# Patient Record
Sex: Female | Born: 1945 | Race: White | Hispanic: No | Marital: Married | Smoking: Never smoker
Health system: Southern US, Community
[De-identification: ages and names within clinical notes are randomized; demographics above are authoritative.]

## PROBLEM LIST (undated history)

## (undated) DIAGNOSIS — I10 Essential (primary) hypertension: Secondary | ICD-10-CM

## (undated) DIAGNOSIS — K5909 Other constipation: Secondary | ICD-10-CM

## (undated) DIAGNOSIS — Z8659 Personal history of other mental and behavioral disorders: Secondary | ICD-10-CM

## (undated) DIAGNOSIS — D649 Anemia, unspecified: Secondary | ICD-10-CM

## (undated) DIAGNOSIS — J189 Pneumonia, unspecified organism: Secondary | ICD-10-CM

## (undated) DIAGNOSIS — K219 Gastro-esophageal reflux disease without esophagitis: Secondary | ICD-10-CM

## (undated) DIAGNOSIS — Z9889 Other specified postprocedural states: Secondary | ICD-10-CM

## (undated) DIAGNOSIS — D126 Benign neoplasm of colon, unspecified: Secondary | ICD-10-CM

## (undated) DIAGNOSIS — M8580 Other specified disorders of bone density and structure, unspecified site: Secondary | ICD-10-CM

## (undated) DIAGNOSIS — E785 Hyperlipidemia, unspecified: Secondary | ICD-10-CM

## (undated) DIAGNOSIS — J45909 Unspecified asthma, uncomplicated: Secondary | ICD-10-CM

## (undated) DIAGNOSIS — M810 Age-related osteoporosis without current pathological fracture: Secondary | ICD-10-CM

## (undated) DIAGNOSIS — R112 Nausea with vomiting, unspecified: Secondary | ICD-10-CM

## (undated) DIAGNOSIS — K5730 Diverticulosis of large intestine without perforation or abscess without bleeding: Secondary | ICD-10-CM

## (undated) DIAGNOSIS — M1990 Unspecified osteoarthritis, unspecified site: Secondary | ICD-10-CM

## (undated) DIAGNOSIS — K209 Esophagitis, unspecified without bleeding: Secondary | ICD-10-CM

## (undated) DIAGNOSIS — T8859XA Other complications of anesthesia, initial encounter: Secondary | ICD-10-CM

## (undated) DIAGNOSIS — K5792 Diverticulitis of intestine, part unspecified, without perforation or abscess without bleeding: Secondary | ICD-10-CM

## (undated) DIAGNOSIS — N809 Endometriosis, unspecified: Secondary | ICD-10-CM

## (undated) DIAGNOSIS — M549 Dorsalgia, unspecified: Secondary | ICD-10-CM

## (undated) DIAGNOSIS — R51 Headache: Secondary | ICD-10-CM

## (undated) DIAGNOSIS — G4730 Sleep apnea, unspecified: Secondary | ICD-10-CM

## (undated) DIAGNOSIS — R002 Palpitations: Secondary | ICD-10-CM

## (undated) DIAGNOSIS — T4145XA Adverse effect of unspecified anesthetic, initial encounter: Secondary | ICD-10-CM

## (undated) DIAGNOSIS — I2699 Other pulmonary embolism without acute cor pulmonale: Secondary | ICD-10-CM

## (undated) DIAGNOSIS — F32A Depression, unspecified: Secondary | ICD-10-CM

## (undated) DIAGNOSIS — I739 Peripheral vascular disease, unspecified: Secondary | ICD-10-CM

## (undated) HISTORY — DX: Other pulmonary embolism without acute cor pulmonale: I26.99

## (undated) HISTORY — DX: Other specified disorders of bone density and structure, unspecified site: M85.80

## (undated) HISTORY — PX: CARDIAC CATHETERIZATION: SHX172

## (undated) HISTORY — PX: PARTIAL HYSTERECTOMY: SHX80

## (undated) HISTORY — PX: ABDOMINOPLASTY: SUR9

## (undated) HISTORY — DX: Diverticulitis of intestine, part unspecified, without perforation or abscess without bleeding: K57.92

## (undated) HISTORY — PX: APPENDECTOMY: SHX54

## (undated) HISTORY — DX: Unspecified asthma, uncomplicated: J45.909

## (undated) HISTORY — PX: ABDOMINAL HYSTERECTOMY: SHX81

## (undated) HISTORY — DX: Gastro-esophageal reflux disease without esophagitis: K21.9

## (undated) HISTORY — DX: Diverticulosis of large intestine without perforation or abscess without bleeding: K57.30

## (undated) HISTORY — DX: Personal history of other mental and behavioral disorders: Z86.59

## (undated) HISTORY — DX: Other constipation: K59.09

## (undated) HISTORY — DX: Esophagitis, unspecified without bleeding: K20.90

## (undated) HISTORY — PX: COLONOSCOPY: SHX174

## (undated) HISTORY — PX: LUMBAR LAMINECTOMY: SHX95

## (undated) HISTORY — DX: Endometriosis, unspecified: N80.9

## (undated) HISTORY — DX: Esophagitis, unspecified: K20.9

## (undated) HISTORY — PX: LIPOSUCTION: SHX10

## (undated) HISTORY — PX: TUBAL LIGATION: SHX77

## (undated) HISTORY — DX: Age-related osteoporosis without current pathological fracture: M81.0

## (undated) HISTORY — DX: Hyperlipidemia, unspecified: E78.5

## (undated) HISTORY — PX: JOINT REPLACEMENT: SHX530

## (undated) HISTORY — DX: Benign neoplasm of colon, unspecified: D12.6

---

## 1998-12-10 ENCOUNTER — Ambulatory Visit (HOSPITAL_COMMUNITY): Admission: RE | Admit: 1998-12-10 | Discharge: 1998-12-10 | Payer: Self-pay | Admitting: Neurosurgery

## 1998-12-10 ENCOUNTER — Encounter: Payer: Self-pay | Admitting: Neurosurgery

## 1999-09-05 ENCOUNTER — Other Ambulatory Visit: Admission: RE | Admit: 1999-09-05 | Discharge: 1999-09-05 | Payer: Self-pay | Admitting: Obstetrics and Gynecology

## 2000-02-12 ENCOUNTER — Encounter: Payer: Self-pay | Admitting: *Deleted

## 2000-02-12 ENCOUNTER — Ambulatory Visit (HOSPITAL_COMMUNITY): Admission: RE | Admit: 2000-02-12 | Discharge: 2000-02-12 | Payer: Self-pay | Admitting: *Deleted

## 2000-04-17 ENCOUNTER — Ambulatory Visit (HOSPITAL_BASED_OUTPATIENT_CLINIC_OR_DEPARTMENT_OTHER): Admission: RE | Admit: 2000-04-17 | Discharge: 2000-04-18 | Payer: Self-pay | Admitting: Orthopedic Surgery

## 2000-08-06 ENCOUNTER — Ambulatory Visit (HOSPITAL_BASED_OUTPATIENT_CLINIC_OR_DEPARTMENT_OTHER): Admission: RE | Admit: 2000-08-06 | Discharge: 2000-08-07 | Payer: Self-pay | Admitting: Orthopedic Surgery

## 2001-04-16 ENCOUNTER — Other Ambulatory Visit: Admission: RE | Admit: 2001-04-16 | Discharge: 2001-04-16 | Payer: Self-pay | Admitting: Obstetrics and Gynecology

## 2002-03-03 ENCOUNTER — Encounter: Payer: Self-pay | Admitting: Internal Medicine

## 2004-09-27 ENCOUNTER — Ambulatory Visit: Payer: Self-pay | Admitting: Internal Medicine

## 2004-10-05 ENCOUNTER — Ambulatory Visit: Payer: Self-pay | Admitting: Internal Medicine

## 2004-11-17 ENCOUNTER — Ambulatory Visit: Payer: Self-pay | Admitting: Internal Medicine

## 2004-11-23 ENCOUNTER — Ambulatory Visit: Payer: Self-pay | Admitting: Internal Medicine

## 2004-12-12 ENCOUNTER — Ambulatory Visit: Payer: Self-pay | Admitting: Internal Medicine

## 2005-01-25 ENCOUNTER — Ambulatory Visit: Payer: Self-pay | Admitting: Internal Medicine

## 2005-03-24 ENCOUNTER — Ambulatory Visit: Payer: Self-pay | Admitting: Internal Medicine

## 2005-03-27 ENCOUNTER — Ambulatory Visit (HOSPITAL_COMMUNITY): Admission: RE | Admit: 2005-03-27 | Discharge: 2005-03-27 | Payer: Self-pay | Admitting: Internal Medicine

## 2005-05-03 ENCOUNTER — Ambulatory Visit: Payer: Self-pay | Admitting: Internal Medicine

## 2005-06-01 ENCOUNTER — Ambulatory Visit: Payer: Self-pay | Admitting: Internal Medicine

## 2006-04-09 ENCOUNTER — Encounter: Admission: RE | Admit: 2006-04-09 | Discharge: 2006-04-09 | Payer: Self-pay | Admitting: Plastic Surgery

## 2006-04-09 ENCOUNTER — Ambulatory Visit: Payer: Self-pay | Admitting: Internal Medicine

## 2006-04-09 ENCOUNTER — Inpatient Hospital Stay (HOSPITAL_COMMUNITY): Admission: AD | Admit: 2006-04-09 | Discharge: 2006-04-12 | Payer: Self-pay | Admitting: Internal Medicine

## 2006-04-17 ENCOUNTER — Ambulatory Visit: Payer: Self-pay | Admitting: Internal Medicine

## 2006-04-19 ENCOUNTER — Ambulatory Visit: Payer: Self-pay | Admitting: Internal Medicine

## 2006-04-23 ENCOUNTER — Ambulatory Visit: Payer: Self-pay | Admitting: Internal Medicine

## 2006-04-24 ENCOUNTER — Ambulatory Visit: Payer: Self-pay | Admitting: Cardiology

## 2006-04-26 ENCOUNTER — Ambulatory Visit: Payer: Self-pay | Admitting: Cardiology

## 2006-04-30 ENCOUNTER — Ambulatory Visit: Payer: Self-pay | Admitting: Cardiology

## 2006-05-02 ENCOUNTER — Ambulatory Visit: Payer: Self-pay | Admitting: Cardiology

## 2006-05-04 ENCOUNTER — Ambulatory Visit: Payer: Self-pay | Admitting: Cardiology

## 2006-05-07 ENCOUNTER — Ambulatory Visit: Payer: Self-pay | Admitting: Cardiology

## 2006-05-14 ENCOUNTER — Ambulatory Visit: Payer: Self-pay | Admitting: Cardiology

## 2006-05-28 ENCOUNTER — Ambulatory Visit: Payer: Self-pay | Admitting: Cardiology

## 2006-06-08 ENCOUNTER — Ambulatory Visit: Payer: Self-pay | Admitting: Cardiology

## 2006-06-21 ENCOUNTER — Ambulatory Visit: Payer: Self-pay | Admitting: Cardiology

## 2006-07-05 ENCOUNTER — Ambulatory Visit: Payer: Self-pay | Admitting: Cardiology

## 2006-07-16 ENCOUNTER — Ambulatory Visit: Payer: Self-pay | Admitting: Cardiology

## 2006-07-25 ENCOUNTER — Ambulatory Visit: Payer: Self-pay | Admitting: *Deleted

## 2006-07-31 ENCOUNTER — Ambulatory Visit: Payer: Self-pay | Admitting: Internal Medicine

## 2006-08-02 ENCOUNTER — Ambulatory Visit: Payer: Self-pay | Admitting: Cardiology

## 2006-08-14 ENCOUNTER — Ambulatory Visit: Payer: Self-pay | Admitting: Cardiovascular Disease

## 2006-08-29 ENCOUNTER — Ambulatory Visit: Payer: Self-pay | Admitting: Cardiology

## 2006-08-30 ENCOUNTER — Ambulatory Visit: Payer: Self-pay | Admitting: Internal Medicine

## 2006-09-13 ENCOUNTER — Ambulatory Visit: Payer: Self-pay | Admitting: Cardiology

## 2006-09-27 ENCOUNTER — Ambulatory Visit: Payer: Self-pay | Admitting: Internal Medicine

## 2006-09-28 ENCOUNTER — Ambulatory Visit: Payer: Self-pay | Admitting: Cardiology

## 2006-10-19 ENCOUNTER — Ambulatory Visit: Payer: Self-pay | Admitting: Cardiology

## 2006-10-22 ENCOUNTER — Encounter: Admission: RE | Admit: 2006-10-22 | Discharge: 2006-10-22 | Payer: Self-pay | Admitting: Internal Medicine

## 2006-10-29 ENCOUNTER — Ambulatory Visit: Payer: Self-pay | Admitting: Internal Medicine

## 2007-01-06 DIAGNOSIS — E785 Hyperlipidemia, unspecified: Secondary | ICD-10-CM

## 2007-02-06 ENCOUNTER — Ambulatory Visit: Payer: Self-pay | Admitting: Internal Medicine

## 2007-02-06 LAB — CONVERTED CEMR LAB
Anticardiolipin IgA: <7 (ref <13)
Anticardiolipin IgG: <7 (ref <11)

## 2007-02-07 ENCOUNTER — Encounter: Payer: Self-pay | Admitting: Internal Medicine

## 2007-03-05 ENCOUNTER — Ambulatory Visit: Payer: Self-pay | Admitting: Internal Medicine

## 2007-06-04 ENCOUNTER — Encounter: Payer: Self-pay | Admitting: Internal Medicine

## 2007-06-20 ENCOUNTER — Ambulatory Visit (HOSPITAL_COMMUNITY): Admission: RE | Admit: 2007-06-20 | Discharge: 2007-06-20 | Payer: Self-pay | Admitting: *Deleted

## 2007-06-21 ENCOUNTER — Encounter: Payer: Self-pay | Admitting: Internal Medicine

## 2007-06-25 ENCOUNTER — Encounter: Payer: Self-pay | Admitting: Internal Medicine

## 2007-06-26 ENCOUNTER — Telehealth (INDEPENDENT_AMBULATORY_CARE_PROVIDER_SITE_OTHER): Payer: Self-pay | Admitting: *Deleted

## 2007-08-13 ENCOUNTER — Encounter: Payer: Self-pay | Admitting: Internal Medicine

## 2007-08-13 ENCOUNTER — Encounter (INDEPENDENT_AMBULATORY_CARE_PROVIDER_SITE_OTHER): Payer: Self-pay | Admitting: *Deleted

## 2007-08-13 DIAGNOSIS — K219 Gastro-esophageal reflux disease without esophagitis: Secondary | ICD-10-CM | POA: Insufficient documentation

## 2007-08-13 LAB — CONVERTED CEMR LAB
Direct LDL: 150.3 mg/dL
Eosinophils Absolute: 0.1 10*3/uL (ref 0.0–0.6)
HCT: 39.3 % (ref 36.0–46.0)
Hemoglobin: 13.3 g/dL (ref 12.0–15.0)
Hgb A1c MFr Bld: 5.9 % (ref 4.6–6.0)
Homocysteine: 9.5 micromoles/L (ref 5.00–13.90)
Lymphocytes Relative: 37.4 % (ref 12.0–46.0)
MCHC: 33.7 g/dL (ref 30.0–36.0)
MCV: 88.2 fL (ref 78.0–100.0)
Neutro Abs: 2.2 10*3/uL (ref 1.4–7.7)
Total CHOL/HDL Ratio: 4
VLDL: 16 mg/dL (ref 0–40)

## 2007-09-12 ENCOUNTER — Ambulatory Visit: Payer: Self-pay | Admitting: Internal Medicine

## 2007-10-30 ENCOUNTER — Ambulatory Visit: Payer: Self-pay | Admitting: Internal Medicine

## 2007-11-28 ENCOUNTER — Encounter: Payer: Self-pay | Admitting: Internal Medicine

## 2007-11-28 ENCOUNTER — Ambulatory Visit: Payer: Self-pay | Admitting: Internal Medicine

## 2008-01-22 DIAGNOSIS — D126 Benign neoplasm of colon, unspecified: Secondary | ICD-10-CM

## 2008-01-22 DIAGNOSIS — K209 Esophagitis, unspecified without bleeding: Secondary | ICD-10-CM | POA: Insufficient documentation

## 2008-01-22 DIAGNOSIS — K5909 Other constipation: Secondary | ICD-10-CM

## 2008-01-22 DIAGNOSIS — K5730 Diverticulosis of large intestine without perforation or abscess without bleeding: Secondary | ICD-10-CM | POA: Insufficient documentation

## 2008-01-22 DIAGNOSIS — Z8659 Personal history of other mental and behavioral disorders: Secondary | ICD-10-CM

## 2008-03-25 ENCOUNTER — Telehealth (INDEPENDENT_AMBULATORY_CARE_PROVIDER_SITE_OTHER): Payer: Self-pay | Admitting: *Deleted

## 2008-05-14 ENCOUNTER — Ambulatory Visit: Payer: Self-pay | Admitting: Internal Medicine

## 2008-05-14 DIAGNOSIS — J309 Allergic rhinitis, unspecified: Secondary | ICD-10-CM | POA: Insufficient documentation

## 2008-05-23 LAB — CONVERTED CEMR LAB
ALT: 29 units/L (ref 0–35)
Albumin: 4.1 g/dL (ref 3.5–5.2)
Bilirubin, Direct: 0.1 mg/dL (ref 0.0–0.3)
Cholesterol: 217 mg/dL (ref 0–200)
Direct LDL: 117.5 mg/dL
HDL: 85.7 mg/dL (ref >39.0)
Total Protein: 6.9 g/dL (ref 6.0–8.3)
Triglycerides: 41 mg/dL (ref 0–149)

## 2008-05-27 ENCOUNTER — Encounter (INDEPENDENT_AMBULATORY_CARE_PROVIDER_SITE_OTHER): Payer: Self-pay | Admitting: *Deleted

## 2008-11-20 HISTORY — PX: TOTAL KNEE ARTHROPLASTY: SHX125

## 2008-11-26 ENCOUNTER — Ambulatory Visit: Payer: Self-pay | Admitting: Internal Medicine

## 2008-11-26 LAB — CONVERTED CEMR LAB
Bilirubin Urine: NEGATIVE
Nitrite: NEGATIVE
Protein, U semiquant: NEGATIVE
Specific Gravity, Urine: 1.01
pH: 6.5

## 2008-11-27 LAB — CONVERTED CEMR LAB
Basophils Relative: 0.1 % (ref 0.0–3.0)
Eosinophils Relative: 0.4 % (ref 0.0–5.0)
HCT: 39.8 % (ref 36.0–46.0)
Hemoglobin: 13.5 g/dL (ref 12.0–15.0)
MCHC: 34 g/dL (ref 30.0–36.0)
Monocytes Absolute: 1 10*3/uL (ref 0.1–1.0)
Neutrophils Relative %: 74.1 % (ref 43.0–77.0)

## 2009-01-06 ENCOUNTER — Encounter: Payer: Self-pay | Admitting: Internal Medicine

## 2009-02-08 ENCOUNTER — Encounter: Payer: Self-pay | Admitting: Internal Medicine

## 2009-04-14 ENCOUNTER — Encounter: Payer: Self-pay | Admitting: Internal Medicine

## 2009-12-20 ENCOUNTER — Encounter: Payer: Self-pay | Admitting: Internal Medicine

## 2010-01-17 ENCOUNTER — Ambulatory Visit: Payer: Self-pay | Admitting: Family

## 2010-01-17 ENCOUNTER — Telehealth (INDEPENDENT_AMBULATORY_CARE_PROVIDER_SITE_OTHER): Payer: Self-pay | Admitting: *Deleted

## 2010-06-27 ENCOUNTER — Encounter: Payer: Self-pay | Admitting: Internal Medicine

## 2010-08-10 ENCOUNTER — Ambulatory Visit: Payer: Self-pay | Admitting: Oncology

## 2010-08-11 ENCOUNTER — Encounter: Payer: Self-pay | Admitting: Internal Medicine

## 2010-08-25 ENCOUNTER — Encounter: Payer: Self-pay | Admitting: Internal Medicine

## 2010-08-31 ENCOUNTER — Encounter: Payer: Self-pay | Admitting: Internal Medicine

## 2010-08-31 LAB — CBC & DIFF AND RETIC
Basophils Absolute: 0 10*3/uL (ref 0.0–0.1)
Eosinophils Absolute: 0.1 10*3/uL (ref 0.0–0.5)
Immature Retic Fract: 5.4 % (ref 0.00–10.70)
MCH: 29.3 pg (ref 25.1–34.0)
MCHC: 33.4 g/dL (ref 31.5–36.0)
MONO#: 0.5 10*3/uL (ref 0.1–0.9)
NEUT%: 49.2 % (ref 38.4–76.8)
Platelets: 227 10*3/uL (ref 145–400)
RBC: 4.4 10*6/uL (ref 3.70–5.45)
Retic Ct Abs: 31.68 10*3/uL (ref 18.30–72.70)
lymph#: 1.4 10*3/uL (ref 0.9–3.3)

## 2010-08-31 LAB — MORPHOLOGY

## 2010-08-31 LAB — CHCC SMEAR

## 2010-09-01 ENCOUNTER — Encounter: Payer: Self-pay | Admitting: Internal Medicine

## 2010-09-01 ENCOUNTER — Encounter: Admission: RE | Admit: 2010-09-01 | Discharge: 2010-09-01 | Payer: Self-pay | Admitting: Cardiovascular Disease

## 2010-09-01 LAB — ANA: Anti Nuclear Antibody(ANA): NEGATIVE

## 2010-09-06 ENCOUNTER — Encounter: Payer: Self-pay | Admitting: Internal Medicine

## 2010-09-06 ENCOUNTER — Ambulatory Visit (HOSPITAL_COMMUNITY): Admission: RE | Admit: 2010-09-06 | Discharge: 2010-09-06 | Payer: Self-pay | Admitting: Cardiovascular Disease

## 2010-09-12 ENCOUNTER — Telehealth: Payer: Self-pay | Admitting: Internal Medicine

## 2010-09-15 ENCOUNTER — Ambulatory Visit: Payer: Self-pay | Admitting: Internal Medicine

## 2010-09-15 DIAGNOSIS — M1710 Unilateral primary osteoarthritis, unspecified knee: Secondary | ICD-10-CM

## 2010-09-15 DIAGNOSIS — Z86718 Personal history of other venous thrombosis and embolism: Secondary | ICD-10-CM

## 2010-09-15 LAB — CONVERTED CEMR LAB
HDL goal, serum: 40 mg/dL
LDL Goal: 160 mg/dL

## 2010-09-19 ENCOUNTER — Ambulatory Visit: Payer: Self-pay | Admitting: Internal Medicine

## 2010-09-19 DIAGNOSIS — R110 Nausea: Secondary | ICD-10-CM

## 2010-09-19 DIAGNOSIS — R1084 Generalized abdominal pain: Secondary | ICD-10-CM

## 2010-09-20 LAB — CONVERTED CEMR LAB
AST: 24 units/L (ref 0–37)
Alkaline Phosphatase: 69 units/L (ref 39–117)
BUN: 14 mg/dL (ref 6–23)
Bilirubin Urine: NEGATIVE
Bilirubin, Direct: 0.1 mg/dL (ref 0.0–0.3)
Calcium: 8.7 mg/dL (ref 8.4–10.5)
Creatinine, Ser: 0.7 mg/dL (ref 0.4–1.2)
Glucose, Bld: 106 mg/dL — ABNORMAL HIGH (ref 70–99)
HCT: 39 % (ref 36.0–46.0)
Hemoglobin: 13.3 g/dL (ref 12.0–15.0)
Lymphs Abs: 1.6 10*3/uL (ref 0.7–4.0)
MCHC: 34.2 g/dL (ref 30.0–36.0)
MCV: 88.1 fL (ref 78.0–100.0)
Monocytes Absolute: 0.8 10*3/uL (ref 0.1–1.0)
Potassium: 3.8 meq/L (ref 3.5–5.1)
RDW: 13.9 % (ref 11.5–14.6)
Sodium: 138 meq/L (ref 135–145)
Specific Gravity, Urine: 1.025 (ref 1.000–1.030)
Total Bilirubin: 1.2 mg/dL (ref 0.3–1.2)
Total Protein, Urine: NEGATIVE mg/dL
Total Protein: 6.9 g/dL (ref 6.0–8.3)
Urine Glucose: NEGATIVE mg/dL
Urobilinogen, UA: 0.2 (ref 0.0–1.0)
WBC: 10.1 10*3/uL (ref 4.5–10.5)

## 2010-09-21 ENCOUNTER — Ambulatory Visit: Payer: Self-pay | Admitting: Cardiology

## 2010-09-22 ENCOUNTER — Telehealth (INDEPENDENT_AMBULATORY_CARE_PROVIDER_SITE_OTHER): Payer: Self-pay

## 2010-09-28 ENCOUNTER — Ambulatory Visit: Payer: Self-pay | Admitting: Internal Medicine

## 2010-09-28 DIAGNOSIS — K5732 Diverticulitis of large intestine without perforation or abscess without bleeding: Secondary | ICD-10-CM

## 2010-10-04 ENCOUNTER — Telehealth: Payer: Self-pay | Admitting: Internal Medicine

## 2010-10-07 ENCOUNTER — Ambulatory Visit: Payer: Self-pay | Admitting: Oncology

## 2010-10-18 ENCOUNTER — Ambulatory Visit: Payer: Self-pay | Admitting: Internal Medicine

## 2010-10-18 DIAGNOSIS — J069 Acute upper respiratory infection, unspecified: Secondary | ICD-10-CM | POA: Insufficient documentation

## 2010-10-18 DIAGNOSIS — R740 Nonspecific elevation of levels of transaminase and lactic acid dehydrogenase [LDH]: Secondary | ICD-10-CM

## 2010-10-19 ENCOUNTER — Inpatient Hospital Stay (HOSPITAL_COMMUNITY)
Admission: RE | Admit: 2010-10-19 | Discharge: 2010-10-21 | Payer: Self-pay | Source: Home / Self Care | Admitting: Orthopedic Surgery

## 2010-10-26 ENCOUNTER — Encounter: Payer: Self-pay | Admitting: Internal Medicine

## 2010-11-28 ENCOUNTER — Inpatient Hospital Stay: Admission: RE | Admit: 2010-11-28 | Payer: Self-pay | Source: Home / Self Care | Admitting: Orthopedic Surgery

## 2010-12-22 NOTE — Cardiovascular Report (Signed)
Summary: [facility]  [facility]   Imported By: Lanelle Bal 09/22/2010 12:24:49  _____________________________________________________________________  External Attachment:    Type:   Image     Comment:   External Document

## 2010-12-22 NOTE — Assessment & Plan Note (Signed)
Summary: follow up diverticulitis   History of Present Illness Visit Type: Follow-up Visit Primary GI MD: Yancey Flemings MD Primary Provider: Clovis Pu Hopper,MD Chief Complaint: pt is feeling well after divertic flare.  She denies any abdominal pain, diarrhea or rectal bleeding. History of Present Illness:   Lisa Moody is a 65 year old with a history of GERD, colon polyps, degenerative joint disease, remote pulmonary embolus, and hyperlipidemia. She was evaluated September 19, 2010 for intermittent lower abdominal pain. CBC, comprehensive metabolic panel, and urinalysis were unremarkable. She was empirically placed on ciprofloxacin and underwent a CT scan of the abdomen and pelvis which revealed mild sigmoid diverticulitis. She subsequently canceled elective knee surgery and presents today for followup. She completed her antibiotic therapy without problems. Her abdominal pain has completely resolved. She has no other GI complaints. She does have several excellent questions diverticular disease and diverticulitis.   GI Review of Systems    Reports acid reflux.      Denies abdominal pain, belching, bloating, chest pain, dysphagia with liquids, dysphagia with solids, heartburn, loss of appetite, nausea, vomiting, vomiting blood, weight loss, and  weight gain.      Reports diverticulosis.     Denies anal fissure, black tarry stools, change in bowel habit, constipation, diarrhea, fecal incontinence, heme positive stool, hemorrhoids, irritable bowel syndrome, jaundice, light color stool, liver problems, rectal bleeding, and  rectal pain.    Current Medications (verified): 1)  Protonix 40 Mg Tbec (Pantoprazole Sodium) .... Take 1 Tablet By Mouth Once A Day 2)  Wellbutrin Xl 150 Mg  Tb24 (Bupropion Hcl) .... Take One Tablet Daily 3)  Vitamin D3 50000 Unit Caps (Cholecalciferol) .Marland Kitchen.. 1 By Mouth Weekly 4)  Boniva 150 Mg Tabs (Ibandronate Sodium) .Marland Kitchen.. 1 By Mouth Monthly 5)  Aspirin 81 Mg Tabs (Aspirin) .Marland Kitchen.. 1 By  Mouth Once Daily 6)  Align  Caps (Probiotic Product) .... Take 1 Capsule By Mouth Daily  Allergies (verified): 1)  ! * Anectine  Past History:  Past Medical History: Reviewed history from 09/15/2010 and no changes required. COLONIC POLYPS (ICD-211.3), diminuitive (3-6 mm) ESOPHAGITIS (ICD-530.10), PMH of  CONSTIPATION, CHRONIC (ICD-564.09) DIVERTICULAR DISEASE (ICD-562.10) DEPRESSION, PMH  OF (ICD-V11.8) GASTROESOPHAGEAL REFLUX DISEASE, CHRONIC (ICD-530.81) HYPERLIPIDEMIA (ICD-272.4): Framingham Study LDL goal = < 160. Pulmonary embolism, PMH  of , 2006 ,post op  Past Surgical History: Reviewed history from 09/19/2010 and no changes required. partial hysterectomy (no  BSO); G 3 P 3 appendectomy tubal ligation Colonoscopy & Endoscopy  11/2007 , Dr Marina Goodell Lumbar laminectomy; Arthroscopy X 3  & ACL of L knee Abdominoplasty  Family History: Reviewed history from 09/15/2010 and no changes required. Father: Tics, lung cancer Mother: breast cancer,  Siblings: negative  Social History: Reviewed history from 09/15/2010 and no changes required. Married Never Smoked ( "social only in 51s) Alcohol use-yes Regular exercise-no  Review of Systems       The patient complains of arthritis/joint pain.  The patient denies allergy/sinus, anemia, anxiety-new, back pain, blood in urine, breast changes/lumps, confusion, cough, coughing up blood, depression-new, fainting, fatigue, fever, headaches-new, hearing problems, heart murmur, heart rhythm changes, itching, menstrual pain, muscle pains/cramps, night sweats, nosebleeds, pregnancy symptoms, shortness of breath, skin rash, sleeping problems, sore throat, swelling of feet/legs, swollen lymph glands, thirst - excessive, urination - excessive, urination changes/pain, urine leakage, vision changes, and voice change.    Vital Signs:  Patient profile:   65 year old female Height:      62 inches Weight:      157  pounds BMI:     28.82 Pulse  rate:   72 / minute Pulse rhythm:   regular BP sitting:   114 / 70  (right arm) Cuff size:   regular  Vitals Entered By: Francee Piccolo CMA Duncan Dull) (September 28, 2010 2:34 PM)   Impression & Recommendations:  Problem # 1:  DIVERTICULITIS-COLON (ICD-562.11) mild sigmoid diverticulitis resolved after a one week course of ciprofloxacin therapy. Discussion today on the epidemiology, pathophysiology, and treatment of diverticular disorders. Accompanying brochure provided as well as GI website access. We discussed the role of diet as well as importance of keeping her bowel somewhat regular. She knows to contact the office for any questions or problems.  Problem # 2:  COLONIC POLYPS (ICD-211.3) surveillance up-to-date. Due for followup 2014  Problem # 3:  CONSTIPATION, CHRONIC (ICD-564.09) should use stool softeners and/or MiraLax to keep bowels regulated. This will be particularly important with upcoming knee surgery, postoperatively.  Problem # 4:  GASTROESOPHAGEAL REFLUX DISEASE, CHRONIC (ICD-530.81) continue PPI therapy antireflux precautions. Again this will be important in the postoperative period.  Patient Instructions: 1)  Copy sent to : Willilam Hopper,MD, Dr. Salvatore Marvel, Dr. Susa Griffins 2)  Please schedule a follow-up appointment as needed.  3)  The medication list was reviewed and reconciled.  All changed / newly prescribed medications were explained.  A complete medication list was provided to the patient / caregiver.

## 2010-12-22 NOTE — Assessment & Plan Note (Signed)
Summary: per dr. hopper/kn   Vital Signs:  Patient profile:   65 year old female Weight:      158 pounds BMI:     29.00 O2 Sat:      98 % Temp:     98.4 degrees F oral Pulse rate:   90 / minute Resp:     15 per minute BP sitting:   124 / 78  (left arm) Cuff size:   large  Vitals Entered By: Shonna Chock CMA (October 18, 2010 4:35 PM) CC: Per Dr.Hopper: patient here to follow-up on labs from another doctor , URI symptoms   Primary Care Provider:  Clovis Pu Hopper,MD  CC:  Per Dr.Hopper: patient here to follow-up on labs from another doctor  and URI symptoms.  History of Present Illness:      This  65 year old woman  presents with URI symptoms; onset 11/24 as ST .  The patient reports  persistant sore throat, but denies nasal congestion, purulent nasal discharge, productive cough, and earache.  The patient denies fever, dyspnea, and wheezing.  The patient denies itchy watery eyes, significant sneezing, and frontal  headache.  The patient denies the following risk factors for Strep sinusitis: unilateral facial pain, tooth pain, and tender adenopathy.   Rx:  Neti pot . She is to have TKR 11/30. Preop labs revealed AST 51( normal  < 37) & ALT 54( < 35). She had been taking up to 3000 mg of Tylenol intermittently for 2 weeks for back  & knee pain. NSAIDS had been D/Ced due to impending surgery.  Current Medications (verified): 1)  Protonix 40 Mg Tbec (Pantoprazole Sodium) .... Take 1 Tablet By Mouth Once A Day 2)  Wellbutrin Xl 150 Mg  Tb24 (Bupropion Hcl) .... Take One Tablet Daily 3)  Vitamin D3 50000 Unit Caps (Cholecalciferol) .Marland Kitchen.. 1 By Mouth Weekly 4)  Boniva 150 Mg Tabs (Ibandronate Sodium) .Marland Kitchen.. 1 By Mouth Monthly 5)  Align  Caps (Probiotic Product) .... Take 1 Capsule By Mouth Daily 6)  Pepcid Ac Maximum Strength 20 Mg Tabs (Famotidine) .Marland Kitchen.. 1 By Mouth Once Daily 7)  Colace 100 Mg Caps (Docusate Sodium) .... As Needed 8)  Miralax  Powd (Polyethylene Glycol 3350) .... As  Needed  Allergies: 1)  ! * Anectine  Review of Systems GU:  No dark urine.  Physical Exam  General:  well-nourished;alert,appropriate and cooperative throughout examination Eyes:  No corneal or conjunctival inflammation noted. No icterus Mouth:  Oral mucosa and oropharynx without lesions or exudates.  Teeth in good repair.  Minimal erythema L > R pharynx  Lungs:  Normal respiratory effort, chest expands symmetrically. Lungs are clear to auscultation, no crackles or wheezes. Heart:  Normal rate and regular rhythm. S1 and S2 normal without gallop, murmur, click, rub . S 4 Abdomen:  Bowel sounds positive,abdomen soft and non-tender without masses, organomegaly or hernias noted. Skin:  No jaundice Cervical Nodes:  No lymphadenopathy noted Axillary Nodes:  No palpable lymphadenopathy Psych:  memory intact for recent and remote, normally interactive, and good eye contact.     Impression & Recommendations:  Problem # 1:  URI (ICD-465.9) seasonal recurrence The following medications were removed from the medication list:    Aspirin 81 Mg Tabs (Aspirin) .Marland Kitchen... 1 by mouth once daily  Problem # 2:  NONSPEC ELEVATION OF LEVELS OF TRANSAMINASE/LDH (ICD-790.4) probably from recent increased Tylenol doses for back & knee pain  Complete Medication List: 1)  Protonix 40 Mg Tbec (Pantoprazole  sodium) .... Take 1 tablet by mouth once a day 2)  Wellbutrin Xl 150 Mg Tb24 (Bupropion hcl) .... Take one tablet daily 3)  Vitamin D3 50000 Unit Caps (Cholecalciferol) .Marland Kitchen.. 1 by mouth weekly 4)  Boniva 150 Mg Tabs (Ibandronate sodium) .Marland Kitchen.. 1 by mouth monthly 5)  Align Caps (Probiotic product) .... Take 1 capsule by mouth daily 6)  Pepcid Ac Maximum Strength 20 Mg Tabs (Famotidine) .Marland Kitchen.. 1 by mouth once daily 7)  Colace 100 Mg Caps (Docusate sodium) .... As needed 8)  Miralax Powd (Polyethylene glycol 3350) .... As needed  Patient Instructions: 1)  Use Zicam Melts as needed for sore throat. The preop  Vancomycin  would certainly cover Strep , but this is clinically not present based on diagnostic Centor  criteria. 2)  Take 650-1000mg  of Tylenol every 4-6 hours as needed for relief of pain or comfort of fever AVOID taking more than 4000mg   in a 24 hour period (can cause liver damage in higher doses). Take a copy of this note to hospital in am.   Orders Added: 1)  Est. Patient Level III [52841]

## 2010-12-22 NOTE — Letter (Signed)
Summary: [facility]  [facility]   Imported By: Lanelle Bal 12/28/2009 08:13:32  _____________________________________________________________________  External Attachment:    Type:   Image     Comment:   External Document

## 2010-12-22 NOTE — Letter (Signed)
Summary: [facility]  [facility]   Imported By: Lanelle Bal 09/13/2010 10:55:18  _____________________________________________________________________  External Attachment:    Type:   Image     Comment:   External Document

## 2010-12-22 NOTE — Progress Notes (Signed)
Summary: Schedule REV  ---- Converted from flag ---- ---- 09/21/2010 5:38 PM, Hilarie Fredrickson MD wrote: Roanna Raider, I spoke with Dr. Thurston Hole. He is concerned about her diverticulitis (as well as sinusitis that she has) and wants to cancel her knee surgery this coming Monday. I support that decision. As such, call her and make arrangements to get her into the office to see me for followup some time next week. If that is not convenient for her, then sometime the following week. Thanks ------------------------------  Phone Note Outgoing Call Call back at [facility] Phone [phone]   Call placed by: Darcey Nora RN, CGRN,  September 22, 2010 8:08 AM Call placed to: Patient Summary of Call: Patient  advised that needs REV she is scheduled to see Dr Marina Goodell 09/28/10 2:45 Initial call taken by: Darcey Nora RN, CGRN,  September 22, 2010 8:08 AM

## 2010-12-22 NOTE — Letter (Signed)
Summary: [facility]  [facility]   Imported By: Lanelle Bal 10/11/2010 09:17:58  _____________________________________________________________________  External Attachment:    Type:   Image     Comment:   External Document

## 2010-12-22 NOTE — Procedures (Signed)
Summary: colonoscopy   Colonoscopy  Procedure date:  03/03/2002  Findings:      Location:  [facility].  Results: Hemorrhoids.     Results: Diverticulosis.         Procedures Next Due Date:    Colonoscopy: 02/2007  Colonoscopy  Procedure date:  03/03/2002  Findings:      Location:  [facility].  Results: Hemorrhoids.     Results: Diverticulosis.         Procedures Next Due Date:    Colonoscopy: 02/2007 Patient Name: Lisa Moody, Lisa Moody MRN:  Procedure Procedures: Colonoscopy CPT: 16109.  Personnel: Endoscopist: Wilhemina Bonito. Marina Goodell, MD.  Referred By: Titus Dubin. Alwyn Ren, MD.  Exam Location: Exam performed in Outpatient Clinic. Outpatient  Patient Consent: Procedure, Alternatives, Risks and Benefits discussed, consent obtained, from patient. Consent was obtained by the RN.  Indications  Average Risk Screening Routine.  History  Pre-Exam Physical: Performed Mar 03, 2002. Entire physical exam was normal.  Exam Exam: Extent of exam reached: Cecum, extent intended: Cecum.  The cecum was identified by appendiceal orifice and IC valve. Patient position: on left side. Colon retroflexion performed. Images taken. ASA Classification: I. Tolerance: excellent.  Monitoring: Pulse and BP monitoring, Oximetry used. Supplemental O2 given.  Colon Prep Used Golytely for colon prep. Prep results: good.  Sedation Meds: Patient assessed and found to be appropriate for moderate (conscious) sedation. Fentanyl 100 mcg. given IV. Versed 7 mg. given IV.  Findings - DIVERTICULOSIS: Ascending Colon to Sigmoid Colon. ICD9: Diverticulosis, Colon: 562.10.  NORMAL EXAM: Cecum.  HEMORRHOIDS: Internal. ICD9: Hemorrhoids, Internal: 455.0.   Assessment Abnormal examination, see findings above.  Diagnoses: 562.10: Diverticulosis, Colon.  455.0: Hemorrhoids, Internal.   Events  Unplanned Interventions: No intervention was required.  Unplanned Events: There were no  complications. Plans Disposition: After procedure patient sent to recovery. After recovery patient sent home.  Scheduling/Referral: Colonoscopy, to Wilhemina Bonito. Marina Goodell, MD, in 5 years for screening.,    This report was created from the original endoscopy report, which was reviewed and signed by the above listed endoscopist.   cc:  Marga Melnick, MD

## 2010-12-22 NOTE — Letter (Signed)
Summary: [facility]  [facility]   Imported By: Lanelle Bal 11/22/2010 12:06:13  _____________________________________________________________________  External Attachment:    Type:   Image     Comment:   External Document

## 2010-12-22 NOTE — Assessment & Plan Note (Signed)
Summary: PRE-SURGERY CLEARANCE--SURG=11/7///SPH--also flu shot//sph   Vital Signs:  Patient profile:   65 year old female Height:      62 inches Weight:      156.4 pounds BMI:     28.71 Temp:     98.3 degrees F oral Pulse rate:   80 / minute Resp:     16 per minute BP sitting:   126 / 72  (left arm) Cuff size:   regular  Vitals Entered By: Shonna Chock CMA (September 15, 2010 3:47 PM) CC: 1.) Surgery clearance-total left knee replacement   2.) Constipation    3.) Stomach discomfort, Pre-op Evaluation, Lower Extremity Joint pain, Lipid Management   CC:  1.) Surgery clearance-total left knee replacement   2.) Constipation    3.) Stomach discomfort, Pre-op Evaluation, Lower Extremity Joint pain, and Lipid Management.  History of Present Illness:   L TKR planned 09/26/2010 by Dr Thurston Hole for end stage DJD. The patient denies respiratory symptoms, GI bleeding, chest pain, edema, PND, and heavy ETOH use.  Conditions requiring action prior to surgery include antiplatelet agents ( 81 mg coated ASA).  There is no history of bleeding  or clotting disorder but PTE in 2006.Dr Cyndie Chime has re-evaluated this issue recently & recommended Lovenox perioperatively.     The patient reports swelling, popping, stiffness for >1 hr, decreased ROM, and weakness, but denies redness, giving away, and locking.   The pain is described as dull and constan , even @ rest, worse with ambulation or steps.  The patient denies the following symptoms of possible systemic disease process: fever, rash, photosensitivity, eye symptoms, diarrhea, and dysuria.      Hyperlipidemia follow-up:  The patient reports muscle aches, GI upset, cramping abdominal pain, and  loose stool but not diarrhea while on Simvastatin 40 mg daily.  The patient reports no exercise due to knee issues.  Adjunctive measures currently used by the patient include ASA.    Lipid Management History:      Positive NCEP/ATP III risk factors include female age 40  years old or older.  Negative NCEP/ATP III risk factors include no history of early menopause without estrogen hormone replacement, non-diabetic, HDL cholesterol greater than 60, no family history for ischemic heart disease, non-tobacco-user status, non-hypertensive, no ASHD (atherosclerotic heart disease), no prior stroke/TIA, no peripheral vascular disease, and no history of aortic aneurysm.     Preventive Screening-Counseling & Management  Alcohol-Tobacco     Smoking Status: never  Caffeine-Diet-Exercise     Does Patient Exercise: no  Current Medications (verified): 1)  Protonix 40 Mg Tbec (Pantoprazole Sodium) .... Take 1 Tablet By Mouth Once A Day 2)  Wellbutrin Xl 150 Mg  Tb24 (Bupropion Hcl) .... Take One Tablet Daily 3)  Vitamin D3 50000 Unit Caps (Cholecalciferol) .Marland Kitchen.. 1 By Mouth Weekly 4)  Boniva 150 Mg Tabs (Ibandronate Sodium) .Marland Kitchen.. 1 By Mouth Monthly 5)  Aspirin 81 Mg Tabs (Aspirin) .Marland Kitchen.. 1 By Mouth Once Daily  Allergies (verified): No Known Drug Allergies  Past History:  Past Medical History: COLONIC POLYPS (ICD-211.3), diminuitive (3-6 mm) ESOPHAGITIS (ICD-530.10), PMH of  CONSTIPATION, CHRONIC (ICD-564.09) DIVERTICULAR DISEASE (ICD-562.10) DEPRESSION, PMH  OF (ICD-V11.8) GASTROESOPHAGEAL REFLUX DISEASE, CHRONIC (ICD-530.81) HYPERLIPIDEMIA (ICD-272.4): Framingham Study LDL goal = < 160. Pulmonary embolism, PMH  of , 2006 ,post op  Past Surgical History: partial hysterectomy (no  BSO); G 3 P 3 appendectomy tubal ligation Colonoscopy & Endoscopy  11/2007 , Dr Marina Goodell Lumbar laminectomy; Arthroscopy X 3  & ACL  of L knee  Family History: Father: Tics, lung cancer Mother: breast cancer,  Siblings: negative  Social History: Married Never Smoked ( "social only in 22s) Alcohol use-yes Regular exercise-no Does Patient Exercise:  no  Review of Systems General:  Denies chills and sweats. ENT:  Denies difficulty swallowing and hoarseness. Resp:  Denies chest  pain with inspiration, coughing up blood, shortness of breath, sputum productive, and wheezing. GI:  Denies bloody stools and dark tarry stools; No dysphagia.  Physical Exam  General:  in no acute distress; alert,appropriate and cooperative throughout examination Eyes:  No corneal or conjunctival inflammation noted.No icterus Mouth:  Oral mucosa and oropharynx without lesions or exudates.  Teeth in good repair. No pharyngeal erythema.   Neck:  No deformities, masses, or tenderness noted. Lungs:  Normal respiratory effort, chest expands symmetrically. Lungs are clear to auscultation, no crackles or wheezes. Heart:  Normal rate and regular rhythm. S1 and S2 normal without  murmur, click, rub . S4 gallop with slurring Abdomen:  Bowel sounds positive,abdomen soft and non-tender without masses, organomegaly or hernias noted. Genitalia:  Dr Tresa Res Pulses:  R and L carotid,radial,dorsalis pedis and posterior tibial pulses are full and equal bilaterally Extremities:  No clubbing, cyanosis, edema.Fusiform L knee with decreased ROM & severe crepitus Neurologic:  alert & oriented X3 and DTRs symmetrical and normal.   Skin:  Intact without suspicious lesions or rashes Cervical Nodes:  No lymphadenopathy noted Axillary Nodes:  No palpable lymphadenopathy Psych:  memory intact for recent and remote, normally interactive, and good eye contact.     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 1 by mouth once daily  Problem # 2:  PULMONARY EMBOLISM, HX OF (ICD-V12.51) peri operative Lovenox planned Her updated medication list for this problem includes:    Aspirin 81 Mg Tabs (Aspirin) .Marland Kitchen... 1 by mouth once daily  Problem # 3:  GASTROESOPHAGEAL REFLUX DISEASE, CHRONIC (ICD-530.81)  Her updated medication list for this problem includes:    Protonix  40 Mg Tbec (Pantoprazole sodium) .Marland Kitchen... Take 1 tablet by mouth once a day  Problem # 4:  HYPERLIPIDEMIA (ICD-272.4)  Complete Medication List: 1)  Protonix 40 Mg Tbec (Pantoprazole sodium) .... Take 1 tablet by mouth once a day 2)  Wellbutrin Xl 150 Mg Tb24 (Bupropion hcl) .... Take one tablet daily 3)  Vitamin D3 50000 Unit Caps (Cholecalciferol) .Marland Kitchen.. 1 by mouth weekly 4)  Boniva 150 Mg Tabs (Ibandronate sodium) .Marland Kitchen.. 1 by mouth monthly 5)  Aspirin 81 Mg Tabs (Aspirin) .Marland Kitchen.. 1 by mouth once daily  Other Orders: Admin 1st Vaccine (40102) Flu Vaccine 68yrs + (72536)  Lipid Assessment/Plan:      Based on NCEP/ATP III, the patient's risk factor category is "0-1 risk factors".  The patient's lipid goals are as follows: Total cholesterol goal is 200; LDL cholesterol goal is 160; HDL cholesterol goal is 40; Triglyceride goal is 150.    Patient Instructions: 1)  You are cleared for the surgery. 2)  Avoid foods high in acid (tomatoes, citrus juices, spicy foods). Avoid eating within two hours of lying down or before exercising. Do not over eat; try smaller more frequent meals. Elevate head of bed twelve inches when sleeping.   Orders Added: 1)  Admin 1st Vaccine [90471] 2)  Flu Vaccine 60yrs + [90658] 3)  Est. Patient Level IV [  99214] Flu Vaccine Consent Questions     Do you have a history of severe allergic reactions to this vaccine? no    Any prior history of allergic reactions to egg and/or gelatin? no    Do you have a sensitivity to the preservative Thimersol? no    Do you have a past history of Guillan-Barre Syndrome? no    Do you currently have an acute febrile illness? no    Have you ever had a severe reaction to latex? no    Vaccine information given and explained to patient? yes    Are you currently pregnant? no    Lot Number:[id number]   Exp Date:05/20/2011   Site Given  Right Deltoid IM Added: 1)  Admin 1st Vaccine [90471] 2)  Flu Vaccine 74yrs + [62831]    .lbflu

## 2010-12-22 NOTE — Progress Notes (Signed)
Summary: Triage / constipation  Phone Note Call from Patient Call back at Home Phone [phone] Call back at [phone] After 9:30   Caller: Patient Call For: Dr. Marina Goodell Reason for Call: Talk to Nurse Summary of Call: having problems w/her diviticulitis and constipation Initial call taken by: Karna Christmas,  October 04, 2010 8:43 AM  Follow-up for Phone Call        Having sm. hard stools and is asking if she could take Smooth Move" from [facility]Has not been using Miralax regularly but re-started it and Align over the week end.Advised it would be best to titrate her Miralax and stay on it on a regular basis.Will have Dr.Perry review and advised her I will call her back if he advises any change in these recommendations. Follow-up by: Teryl Lucy RN,  October 04, 2010 9:51 AM  Additional Follow-up for Phone Call Additional follow up Details #1::        yes. I would recommend using MiraLax on a daily basis. She could titrate this as needed to have one to 2 bowel movements daily. Thanks Additional Follow-up by: Hilarie Fredrickson MD,  October 04, 2010 9:53 AM

## 2010-12-22 NOTE — Letter (Signed)
Summary: [facility]  [facility]   Imported By: Lanelle Bal 09/22/2010 12:26:31  _____________________________________________________________________  External Attachment:    Type:   Image     Comment:   External Document

## 2010-12-22 NOTE — Letter (Signed)
Summary: [facility]  [facility]   Imported By: Lanelle Bal 08/29/2010 12:44:31  _____________________________________________________________________  External Attachment:    Type:   Image     Comment:   External Document

## 2010-12-22 NOTE — Letter (Signed)
Summary: [facility]  [facility]   Imported By: Lanelle Bal 07/04/2010 12:15:27  _____________________________________________________________________  External Attachment:    Type:   Image     Comment:   External Document

## 2010-12-22 NOTE — Letter (Signed)
Summary: [facility]  [facility]   Imported By: Lennie Odor 09/07/2010 15:51:49  _____________________________________________________________________  External Attachment:    Type:   Image     Comment:   External Document

## 2010-12-22 NOTE — Progress Notes (Signed)
Summary: Wants appt sooner than next available.  Phone Note Call from Patient Call back at Work Phone [phone]   Call For: Dr Marina Goodell Summary of Call: Wants to be seen before next available on 10-10-10. Stomach problems. Initial call taken by: Leanor Kail [facility],  September 12, 2010 1:00 PM  Follow-up for Phone Call        left message on machine to call back Chales Abrahams CMA Duncan Dull)  September 13, 2010 8:44 AM   pt is feeling better today, but she occasionally has stomach cramps and nausea.    appt with Dr Marina Goodell 09/19/10 Follow-up by: Chales Abrahams CMA Duncan Dull),  September 13, 2010 2:23 PM

## 2010-12-22 NOTE — Letter (Signed)
Summary: [facility]  [facility]   Imported By: Lanelle Bal 10/31/2010 10:32:30  _____________________________________________________________________  External Attachment:    Type:   Image     Comment:   External Document

## 2010-12-22 NOTE — Assessment & Plan Note (Signed)
Summary: acute sinus/alr   Vital Signs:  Patient profile:   65 year old female Height:      62 inches Weight:      154.13 pounds BMI:     28.29 Temp:     98.1 degrees F oral Pulse rate:   88 / minute Pulse rhythm:   regular Resp:     16 per minute BP sitting:   102 / 76  (left arm) Cuff size:   regular CC: room 14 Possible sinus infection? Comments Headache and sinus pressure, scratchy throat, sinus drainage since Saturday. If given antibiotic pt would like Augmentin.   CC:  room 14 Possible sinus infection?.  History of Present Illness: Lisa Moody is a 65 year old female who presents with c/o copious nasal drainage, head pressure, sore throat.  Nasal drainage varies white to yellow.  Denies fever.  Has tried OTC claritin D.  Noted several week history of nasal congestion which she attributed to allergies.  Symptoms worsened over the weekend.  Denies blurred vision or pain with EOM  Allergies (verified): No Known Drug Allergies  Physical Exam  General:  Well-developed,well-nourished,in no acute distress; alert,appropriate and cooperative throughout examination Head:  Normocephalic and atraumatic without obvious abnormalities. No apparent alopecia or balding. No sinus tenderness to palpation Eyes:  PERRLA Ears:  External ear exam shows no significant lesions or deformities.  Otoscopic examination reveals clear canals, tympanic membranes are intact bilaterally without bulging, retraction, inflammation or discharge. Hearing is grossly normal bilaterally. Neck:  No deformities, masses, or tenderness noted. Lungs:  Normal respiratory effort, chest expands symmetrically. Lungs are clear to auscultation, no crackles or wheezes. Heart:  Normal rate and regular rhythm. S1 and S2 normal without gallop, murmur, click, rub or other extra sounds.   Impression & Recommendations:  Problem # 1:  SINUSITIS (ICD-473.9) Assessment New  The following medications were removed from the  medication list:    Ciprofloxacin Hcl 500 Mg Tabs (Ciprofloxacin hcl) .Marland Kitchen... 1 two times a day    Metronidazole 500 Mg Tabs (Metronidazole) .Marland Kitchen... 1 three times a day ;avoid alcohol Her updated medication list for this problem includes:    Claritin-d 12 Hour 5-120 Mg Xr12h-tab (Loratadine-pseudoephedrine) .Marland Kitchen... Take 1 tablet by mouth once a day    Augmentin 500-125 Mg Tabs (Amoxicillin-pot clavulanate) ..... One tablet by mouth two times a day x 10 days  Complete Medication List: 1)  Protonix 40 Mg Tbec (Pantoprazole sodium) .... Take 1 tablet by mouth once a day 2)  Wellbutrin Xl 150 Mg Tb24 (Bupropion hcl) .... Take one tablet daily 3)  Claritin-d 12 Hour 5-120 Mg Xr12h-tab (Loratadine-pseudoephedrine) .... Take 1 tablet by mouth once a day 4)  Augmentin 500-125 Mg Tabs (Amoxicillin-pot clavulanate) .... One tablet by mouth two times a day x 10 days  Patient Instructions: 1)   Call if you develop fever over 101, increasing sinus pressure, pain with eye movement, increased facial tenderness of swelling, or if you develop visual changes. Prescriptions: AUGMENTIN 500-125 MG TABS (AMOXICILLIN-POT CLAVULANATE) one tablet by mouth two times a day x 10 days  #20 x 0   Entered and Authorized by:   Lemont Fillers FNP   Signed by:   Lemont Fillers FNP on 01/17/2010   Method used:   Electronically to        [facility] (retail)       [address]       [place], [place]  [id number]  Ph: [phone]       Fax: [phone]   RxID:   [id number]   Current Allergies (reviewed today): No known allergies

## 2010-12-22 NOTE — Assessment & Plan Note (Signed)
Summary: abd pain, nausea   History of Present Illness Visit Type: Follow-up Visit Primary GI MD: Yancey Flemings MD Primary Provider: Marga Melnick, MD Chief Complaint: Nausea, abdominal pain on/off for 1 month History of Present Illness:   65 year old female with a history of GERD,colon polyps, degenerative joint disease, remote pulmonary embolus, and hyperlipidemia. She presents today regarding recurrent problems with abdominal pain. She is accompanied by her husband. She describes intermittent problems with lower abdominal pain over the past year. She describes approximately 4 episodes of intermittent sharp pain that lasts for a few seconds. The discomfort seems to occur mostly postprandially. She had been awoken once with the discomfort. Defecation seems to help. Her weight fluctuates. No bleeding. She has had somewhat more frequent episodes over the past 2 weeks. However, the pattern and short-lived nature are unchanged. Her husband is concern that this may represent diverticulitis. The patient is concerned stating that "she needs to get better by Monday" as she is anticipating knee surgery. She continues on pantoprazole for GERD. No active symptoms. She was having some nausea, which has improved after discontinuing a statin drug. She denies fevers, dysuria, or hematuria. She had her gynecology evaluation earlier this year, and reports this to have been unremarkable. She last underwent colonoscopy in January of 2009. She was found to have significant pandiverticulosis as well as diminutive colon polyps. Followup in 5 years recommended. She also underwent upper endoscopy at that time. This revealed reflux esophagitis.   GI Review of Systems    Reports abdominal pain, bloating, and  nausea.     Location of  Abdominal pain: lower abdomen.    Denies acid reflux, belching, chest pain, dysphagia with liquids, dysphagia with solids, heartburn, loss of appetite, vomiting, vomiting blood, weight loss, and   weight gain.      Reports change in bowel habits and  constipation.     Denies anal fissure, black tarry stools, diarrhea, diverticulosis, fecal incontinence, heme positive stool, hemorrhoids, irritable bowel syndrome, jaundice, light color stool, liver problems, rectal bleeding, and  rectal pain.    Current Medications (verified): 1)  Protonix 40 Mg Tbec (Pantoprazole Sodium) .... Take 1 Tablet By Mouth Once A Day 2)  Wellbutrin Xl 150 Mg  Tb24 (Bupropion Hcl) .... Take One Tablet Daily 3)  Vitamin D3 50000 Unit Caps (Cholecalciferol) .Marland Kitchen.. 1 By Mouth Weekly 4)  Boniva 150 Mg Tabs (Ibandronate Sodium) .Marland Kitchen.. 1 By Mouth Monthly 5)  Aspirin 81 Mg Tabs (Aspirin) .Marland Kitchen.. 1 By Mouth Once Daily 6)  Align  Caps (Probiotic Product) .... Take 1 Capsule By Mouth Daily  Allergies (verified): 1)  ! * Anectine  Past History:  Past Medical History: Reviewed history from 09/15/2010 and no changes required. COLONIC POLYPS (ICD-211.3), diminuitive (3-6 mm) ESOPHAGITIS (ICD-530.10), PMH of  CONSTIPATION, CHRONIC (ICD-564.09) DIVERTICULAR DISEASE (ICD-562.10) DEPRESSION, PMH  OF (ICD-V11.8) GASTROESOPHAGEAL REFLUX DISEASE, CHRONIC (ICD-530.81) HYPERLIPIDEMIA (ICD-272.4): Framingham Study LDL goal = < 160. Pulmonary embolism, PMH  of , 2006 ,post op  Past Surgical History: partial hysterectomy (no  BSO); G 3 P 3 appendectomy tubal ligation Colonoscopy & Endoscopy  11/2007 , Dr Marina Goodell Lumbar laminectomy; Arthroscopy X 3  & ACL of L knee Abdominoplasty  Family History: Reviewed history from 09/15/2010 and no changes required. Father: Tics, lung cancer Mother: breast cancer,  Siblings: negative  Social History: Reviewed history from 09/15/2010 and no changes required. Married Never Smoked ( "social only in 75s) Alcohol use-yes Regular exercise-no  Review of Systems  The patient complains of allergy/sinus, cough, fatigue, headaches-new, sleeping problems, and sore throat.  The patient  denies anemia, anxiety-new, arthritis/joint pain, back pain, blood in urine, breast changes/lumps, change in vision, confusion, coughing up blood, depression-new, fainting, fever, hearing problems, heart murmur, heart rhythm changes, itching, menstrual pain, muscle pains/cramps, night sweats, nosebleeds, pregnancy symptoms, shortness of breath, skin rash, swelling of feet/legs, swollen lymph glands, thirst - excessive , urination - excessive , urination changes/pain, urine leakage, vision changes, and voice change.    Vital Signs:  Patient profile:   65 year old female Height:      62 inches Weight:      155.25 pounds BMI:     28.50 Temp:     99.8 degrees F oral Pulse rate:   124 / minute Pulse rhythm:   regular BP sitting:   102 / 70  (left arm) Cuff size:   regular  Vitals Entered By: June McMurray CMA Duncan Dull) (September 19, 2010 4:01 PM)  Physical Exam  General:  Well developed, well nourished, no acute distress. Head:  Normocephalic and atraumatic. Eyes:  PERRLA, no icterus. Nose:  No deformity, discharge,  or lesions. Mouth:  No deformity or lesions. Neck:  Supple; no masses or thyromegaly. Lungs:  Clear throughout to auscultation. Heart:  Regular rate and rhythm; no murmurs, rubs,  or bruits. Abdomen:  Soft, nontender and nondistended. No masses, hepatosplenomegaly or hernias noted. Normal bowel sounds. Msk:  . Normal posture. Pulses:  Normal pulses noted. Extremities:  no edema Neurologic:  alert and oriented Skin:  Intact without significant lesions or rashes. Psych:  Alert and cooperative. Normal mood and affect.   Impression & Recommendations:  Problem # 1:  ABDOMINAL PAIN, GENERALIZED (ICD-789.07) intermittent fleeting abdominal pain as described. No worrisome features. Possible causes include intestinal spasm in a patient with significant diverticular disease, diverticulitis less likely though problems over the past 2 weeks have been more frequent. Other possibilities  include discomfort related to adhesions or non-GI causes such as GYN (though negative evaluation earlier this year) or GU.  Plan: #1. Screening laboratories including CBC, comprehensive metabolic panel #2. Urinalysis #3. Empiric treatment with ciprofloxacin and metronidazole 500 mg p.o. b.i.d. x1 week for each. However, the patient has a social that later this week and elected to forego metronidazole because of possible interaction with alcohol. I think this is fine. #4. CT Scan of the abdomen and pelvis with contrast #5. Further plans to be determined after the above completed.  Problem # 2:  NAUSEA (ICD-787.02) seemingly related to statin. Off statin with resolution of nausea. She might restart statin to see if nausea returns. If so, talk to Dr. Alwyn Ren about alternative therapies.  Problem # 3:  ESOPHAGITIS (ICD-530.10) GERD with a history of reflux esophagitis. Antireflux precautions and PPI. Particularly important to continue PPI therapy during upcoming surgery as she will spend more time in a recumbent position  Problem # 4:  COLONIC POLYPS (ICD-211.3) due for followup colonoscopy in 2014  Other Orders: TLB-CBC Platelet - w/Differential (85025-CBCD) TLB-BMP (Basic Metabolic Panel-BMET) (80048-METABOL) TLB-Hepatic/Liver Function Pnl (80076-HEPATIC) TLB-Udip w/ Micro (81001-URINE) CT Abdomen/Pelvis with Contrast (CT Abd/Pelvis w/con)  Patient Instructions: 1)  CT Scan [place] CT 09/21/10 1:00 pm arrive at 12:45 pm 2)  Labs and UA ordered for patient to have drawn today on basement floor. 3)  Cipro 500 mg 1 by mouth two times a day #14. 4)  Copy sent to : Marga Melnick, MD, Dr. Susa Griffins, Dr. Salvatore Marvel 5)  The medication list was reviewed and reconciled.  All changed / newly prescribed medications were explained.  A complete medication list was provided to the patient / caregiver. Prescriptions: CIPRO 500 MG TABS (CIPROFLOXACIN HCL) 1 by mouth two times a day  #14 x 0    Entered by:   Milford Cage NCMA   Authorized by:   Hilarie Fredrickson MD   Signed by:   Milford Cage NCMA on 09/19/2010   Method used:   Electronically to        [facility] (retail)       [address]       [place], [place]  [id number]       Ph: [phone]       Fax: [phone]   RxID:   [id number]

## 2010-12-22 NOTE — Progress Notes (Signed)
  Phone Note Call from Patient Call back at Home Phone [phone]   Caller: Patient Summary of Call: pt called c/o feeling sick",  sinuses congestion, offered appt with Sandford Craze, ov scheduled.  pt wanted to know will Dr Alwyn Ren know she is seeing NP, informed pt we share same chart and  will know .Kandice Hams  January 17, 2010 10:04 AM'   Initial call taken by: Kandice Hams,  January 17, 2010 10:04 AM

## 2011-01-16 ENCOUNTER — Other Ambulatory Visit: Payer: Self-pay | Admitting: Dermatology

## 2011-01-26 ENCOUNTER — Encounter: Payer: Self-pay | Admitting: Internal Medicine

## 2011-01-31 ENCOUNTER — Telehealth: Payer: Self-pay | Admitting: Internal Medicine

## 2011-01-31 LAB — BASIC METABOLIC PANEL
CO2: 24 mEq/L (ref 19–32)
CO2: 26 mEq/L (ref 19–32)
Calcium: 8.2 mg/dL — ABNORMAL LOW (ref 8.4–10.5)
Calcium: 8.8 mg/dL (ref 8.4–10.5)
Chloride: 104 mEq/L (ref 96–112)
Chloride: 105 mEq/L (ref 96–112)
Creatinine, Ser: 0.59 mg/dL (ref 0.4–1.2)
GFR calc Af Amer: >60 mL/min (ref >60)
GFR calc non Af Amer: >60 mL/min (ref >60)
GFR calc non Af Amer: >60 mL/min (ref >60)
Glucose, Bld: 131 mg/dL — ABNORMAL HIGH (ref 70–99)
Potassium: 3.8 mEq/L (ref 3.5–5.1)
Sodium: 136 mEq/L (ref 135–145)
Sodium: 136 mEq/L (ref 135–145)

## 2011-01-31 LAB — CBC
HCT: 29.1 % — ABNORMAL LOW (ref 36.0–46.0)
MCH: 28.6 pg (ref 26.0–34.0)
MCHC: 31.8 g/dL (ref 30.0–36.0)
MCV: 88.4 fL (ref 78.0–100.0)
MCV: 88.6 fL (ref 78.0–100.0)
Platelets: 139 10*3/uL — ABNORMAL LOW (ref 150–400)
Platelets: 143 10*3/uL — ABNORMAL LOW (ref 150–400)
RBC: 3.29 MIL/uL — ABNORMAL LOW (ref 3.87–5.11)
RBC: 3.51 MIL/uL — ABNORMAL LOW (ref 3.87–5.11)
RDW: 14.2 % (ref 11.5–15.5)
WBC: 8.2 10*3/uL (ref 4.0–10.5)

## 2011-01-31 NOTE — Miscellaneous (Signed)
----   Converted from flag ---- ---- 01/26/2011 6:04 PM, Marga Melnick MD wrote: will do  ---- 01/26/2011 12:57 PM, Hilarie Fredrickson MD wrote:  I think that she probably did have low-grade diverticulitis  ( see my followup note ). She did have a colonoscopy in 2009. Since you  (appropriately) shared possible concerns with Gerlene Burdock and Chrysten , it may be best for a routine office visit with me when she has completely recovered from her orthopedic surgery. Please let them know. Thanks.  ---- 01/26/2011 7:06 AM, Marga Melnick MD wrote: yes; I had mentioned to her the Radiologist's recommendations of F/U & spoke with Karen Kitchens 03/06 & gave him a copy. Her presentation in 11/11 was not classic for Diverticulitis. My impresion from your note was that you felt same.  ---- 01/25/2011 9:01 AM, Hilarie Fredrickson MD wrote: From November???  ---- 01/25/2011 6:00 AM, Marga Melnick MD wrote: please review CT & Appendum. Her symptoms were not classic for Diverticulitis( see your OV) & Radiologist is equivocating about the colon changes . ? Flex sig vs Limited CT ?She is now almost recovered from her TKR. Thanks, Hopp ------------------------------

## 2011-02-01 LAB — PROTIME-INR
INR: 0.88 (ref 0.00–1.49)
Prothrombin Time: 12.1 seconds (ref 11.6–15.2)

## 2011-02-01 LAB — COMPREHENSIVE METABOLIC PANEL
ALT: 54 U/L — ABNORMAL HIGH (ref 0–35)
Alkaline Phosphatase: 67 U/L (ref 39–117)
BUN: 18 mg/dL (ref 6–23)
CO2: 29 mEq/L (ref 19–32)
Calcium: 10.2 mg/dL (ref 8.4–10.5)
GFR calc non Af Amer: >60 mL/min (ref >60)
Glucose, Bld: 107 mg/dL — ABNORMAL HIGH (ref 70–99)
Potassium: 4.8 mEq/L (ref 3.5–5.1)
Total Protein: 6.9 g/dL (ref 6.0–8.3)

## 2011-02-01 LAB — DIFFERENTIAL
Basophils Relative: 1 % (ref 0–1)
Eosinophils Absolute: 0.2 10*3/uL (ref 0.0–0.7)
Monocytes Relative: 9 % (ref 3–12)
Neutro Abs: 3.5 10*3/uL (ref 1.7–7.7)
Neutrophils Relative %: 56 % (ref 43–77)

## 2011-02-01 LAB — URINALYSIS, ROUTINE W REFLEX MICROSCOPIC
Glucose, UA: NEGATIVE mg/dL
Ketones, ur: NEGATIVE mg/dL
Nitrite: NEGATIVE
Protein, ur: NEGATIVE mg/dL
Specific Gravity, Urine: 1.02 (ref 1.005–1.030)

## 2011-02-01 LAB — CBC
MCH: 28.6 pg (ref 26.0–34.0)
MCHC: 32.3 g/dL (ref 30.0–36.0)
RDW: 14.1 % (ref 11.5–15.5)
WBC: 6.2 10*3/uL (ref 4.0–10.5)

## 2011-02-01 LAB — SURGICAL PCR SCREEN: Staphylococcus aureus: NEGATIVE

## 2011-02-01 LAB — TYPE AND SCREEN

## 2011-02-01 LAB — URINE CULTURE
Colony Count: NO GROWTH
Culture  Setup Time: 201111281718

## 2011-02-01 LAB — APTT: aPTT: 25 seconds (ref 24–37)

## 2011-02-16 NOTE — Progress Notes (Signed)
Summary: PATIENT CALL: CT results  Phone Note Call from Patient Call back at Home Phone [phone] Call back at [phone] Cell   Caller: Patient Call For: Dr. Marina Goodell Reason for Call: Lab or Test Results Summary of Call: Has questions about her CT results Initial call taken by: Karna Christmas,  January 31, 2011 4:00 PM  Follow-up for Phone Call        Patient called and requests that Dr. Marina Goodell call her about her CT Scan results. She is aware that Dr. Marina Goodell is out of the office for a while and knows he does not return until the week of the 26th. Patient states that Dr. Alwyn Ren called her attention to something mentioned on the scan and thinks she should follow-up on this. Follow-up by: Selinda Michaels RN,  January 31, 2011 4:47 PM  Additional Follow-up for Phone Call Additional follow up Details #1::        I CALLED Khamiyah 02-06-11. SHE WAS SEEN 09-19-10 FOR LLQ PAIN. TREATED WITH ANTIBIOTICS AND HAD CT 09-21-10 THAT WAS MOST C/W NONCOMPLICATED DIVERTICULITIS. SHE WAS WAS SEEN IN FOLLOW UP 09-28-10 AND WAS ASYMPTOMATIC. SHE REMAINS WELL WITH NO GI COMPLAINTS. I AND REVIEWED HER PRIOR CT REPORT WITH HER AND THE ISSUE RAISED RE THE NEED FOR SCOPE OR BE TO R/O UNDELYING MASS. HOWEVER, SHE HAS HAD PREVIOUS COLONOSCOPIES IN 2003 AND AGAIN IN 2009 (MARKED DIVERTICULOSIS AND SMALL POLYPS). I DIDN'T FEEL THAT IT WAS NECCESSARRY TO REPEAT GIVEN THE OVERALL CLINICAL PICTURE, PRIOR COLONOSCOPIES, AND HER REMAINING WELL, BUT THIS WAS OFFERREDTO HER. SHE APPRECIATED THE REVIEW AND IS COMFORTABLE KEEPING HER CURRENT FOLLOW UP DATE OF 2014. ANY FURTHER QUESTIONS OR PROBLEMS, SHE KNOWS TO CALL.  Additional Follow-up by: Hilarie Fredrickson MD,  February 11, 2011 3:52 PM

## 2011-03-02 ENCOUNTER — Ambulatory Visit (INDEPENDENT_AMBULATORY_CARE_PROVIDER_SITE_OTHER): Payer: BC Managed Care – PPO | Admitting: Internal Medicine

## 2011-03-02 ENCOUNTER — Encounter: Payer: Self-pay | Admitting: Internal Medicine

## 2011-03-02 DIAGNOSIS — J40 Bronchitis, not specified as acute or chronic: Secondary | ICD-10-CM

## 2011-03-02 DIAGNOSIS — J029 Acute pharyngitis, unspecified: Secondary | ICD-10-CM

## 2011-03-02 DIAGNOSIS — R509 Fever, unspecified: Secondary | ICD-10-CM

## 2011-03-02 MED ORDER — HYDROCODONE-HOMATROPINE 5-1.5 MG/5ML PO SYRP: 5.0000 mL | ORAL_SOLUTION | Freq: Four times a day (QID) | ORAL | Status: DC | PRN

## 2011-03-02 MED ORDER — AZITHROMYCIN 250 MG PO TABS: 250.0000 mg | ORAL_TABLET | Freq: Every day | ORAL | Status: DC

## 2011-03-02 MED ORDER — HYDROCODONE-HOMATROPINE 5-1.5 MG/5ML PO SYRP: 5.0000 mL | ORAL_SOLUTION | Freq: Four times a day (QID) | ORAL | Status: AC | PRN

## 2011-03-02 NOTE — Progress Notes (Signed)
Addended by: Doristine Devoid on: 03/02/2011 11:30 AM   Modules accepted: Orders

## 2011-03-02 NOTE — Patient Instructions (Signed)
Use Zinc  lozenges as needed for the sore throat. Force   nondairy fluids over the next 48 hours.

## 2011-03-02 NOTE — Progress Notes (Signed)
  Subjective:    Patient ID: Lisa Moody, female    DOB: 16-Jan-1946, 65 y.o.   MRN: [id number]  HPI SINUSITIS  Onset: Sore throat 04/11 am   Severity: 8-9  on 10 scale Better with: Claritin minimally  Symptoms Cough: yes, clear - yellow; scant blood from throat with clearing Runny nose: yes, clear Fever: yes, low grade   Highest Temp: not taken 2 home Sinus Pressure: yes  Ears Blocked: yes, slightly  Teeth Ache: yes, gums  Frontal Headache: yes, minor  Second sickening: no   PMH Sinusitis : yes  PMH Prior Sinus or Ear Surgery: no  Recent antibiotic usage (last 30 days): no  PMH of Diabetes or Immunocompromise: no    Red flags  Change in vision: no Rash: no       Review of Systems her orthopedic surgeon has recommended SBE prophylaxis following the total left knee surgery.  She denies any chest pain or pleuritic pleurisy; she's had no significant hemoptysis.       Objective:   Physical Exam   On exam she is in no distress. Pupils are equal round reactive to light; extraocular motions intact. Vision is normal. There is no evidence of conjunctivitis.  Nares are patent without exudates or erythema.  Dental  hygiene is excellent. She has mild erythema of the posterior oropharynx.  She has no lymphadenopathy of head , neck or  axilla.  Chest is clear without rales, rhonchi, or wheezes.  She has an S4 without significant murmur.  Extremities reveal no cyanosis, clubbing, or edema. Homans sign is negative.          Assessment & Plan:  #1 she describes acute bronchitic symptoms. Somewhat puzzling is the brief nature of the symptoms.  She has had some production of blood with clearing of her  throat. True  hemoptysis is not suggested.  Plan: Zithromax would cover the bronchitis and bacterial pharyngitis.

## 2011-03-07 ENCOUNTER — Ambulatory Visit (INDEPENDENT_AMBULATORY_CARE_PROVIDER_SITE_OTHER): Payer: BC Managed Care – PPO | Admitting: Internal Medicine

## 2011-03-07 ENCOUNTER — Encounter: Payer: Self-pay | Admitting: Internal Medicine

## 2011-03-07 VITALS — BP 100/60 | HR 88 | Ht 62.0 in | Wt 147.0 lb

## 2011-03-07 DIAGNOSIS — K5730 Diverticulosis of large intestine without perforation or abscess without bleeding: Secondary | ICD-10-CM

## 2011-03-07 DIAGNOSIS — K219 Gastro-esophageal reflux disease without esophagitis: Secondary | ICD-10-CM

## 2011-03-07 DIAGNOSIS — K5732 Diverticulitis of large intestine without perforation or abscess without bleeding: Secondary | ICD-10-CM

## 2011-03-07 DIAGNOSIS — K5900 Constipation, unspecified: Secondary | ICD-10-CM

## 2011-03-07 DIAGNOSIS — Z86010 Personal history of colonic polyps: Secondary | ICD-10-CM

## 2011-03-07 NOTE — Progress Notes (Signed)
HISTORY OF PRESENT ILLNESS:  Lisa Moody is a 65 y.o. female with GERD, adenomatous colon polyps, hyperlipidemia, remote pulmonary embolus, and degenerative joint disease status post left total knee replacement. She was seen in October 2011 4 recurrent lower abdominal discomfort. CT scan of the abdomen and pelvis demonstrated uncomplicated sigmoid diverticulitis. She was treated with antibiotic therapy and improved. She has had no recurrent symptoms. For chronic constipation she takes MiraLax with good results. No GERD complaints. Colonoscopy on 2 prior occasions, most recently January 2009 with pandiverticulosis and diminutive polyps. Routine followup planned 5 years from that date. As stated, no active complaints, only routine followup today.  REVIEW OF SYSTEMS:  All non-GI ROS negative except for sinus and allergy trouble, fatigue.  Past Medical History  Diagnosis Date  . Benign neoplasm of colon   . Esophagitis, unspecified   . Other and unspecified hyperlipidemia   . Other constipation   . Diverticulosis of colon (without mention of hemorrhage)   . Personal history of other mental disorder   . Esophageal reflux   . Other and unspecified hyperlipidemia   . Pulmonary embolism     2006    Past Surgical History  Procedure Date  . Partial hysterectomy   . Appendectomy   . Tubal ligation   . Lumbar laminectomy   . Abdominoplasty   . Total knee arthroplasty     Social History Lisa Moody  reports that she has never smoked. She does not have any smokeless tobacco history on file. She reports that she drinks alcohol. She reports that she does not use illicit drugs.  family history includes Breast cancer in her mother; Lung cancer in her father; and Tics in her father.  Allergies  Allergen Reactions  . Succinylcholine        PHYSICAL EXAMINATION:  Vital signs: BP 100/60  Pulse 88  Ht 5\' 2"  (1.575 m)  Wt 147 lb (66.679 kg)  BMI 26.89 kg/m2 General: Well-developed,  well-nourished, no acute distress HEENT: Sclerae are anicteric, conjunctiva pink. Oral mucosa intact Lungs: Clear Heart: Regular Abdomen: soft, nontender, nondistended, no obvious ascites, no peritoneal signs, normal bowel sounds. No organomegaly. Extremities: No edema Psychiatric: alert and oriented x3. Cooperative   ASSESSMENT:  #1. Sigmoid diverticulitis. Resolved without recurrence #2. Chronic constipation. Addressed nicely with MiraLax #3. History of adenomatous colon polyps. Surveillance up-to-date. Due for routine followup around January 2014 #4. GERD. Asymptomatic on acid suppressive therapy   PLAN:  #1. Continue MiraLax as needed #2. Continue acid suppressive therapy for GERD as well as reflux precautions #3. He planned surveillance colonoscopy date #4. Contact the office in the interim for any questions, concerns, or clinical problems.

## 2011-03-07 NOTE — Patient Instructions (Signed)
Follow up prn

## 2011-03-08 ENCOUNTER — Encounter: Payer: Self-pay | Admitting: Internal Medicine

## 2011-04-04 NOTE — Assessment & Plan Note (Signed)
[facility]                         GASTROENTEROLOGY OFFICE NOTE   NAME:Kutzer, ANYLA ISRAELSON                      MRN:          [id number]  DATE:09/12/2007                            DOB:          1946/10/20    REASON FOR CONSULTATION:  Chest pain.  Question reflux.   HISTORY:  This is a 65 year old white female with a history of  depression, dyslipidemia, and diverticulosis.  She is referred through  the courtesy of Dr. Alwyn Ren regarding chest pain possibly due to reflux  disease.  The patient reports to me that she began to develop problems  with soreness, or tightness in the chest this summer.  In general, upon  awakening in the morning, she notices congestion and fullness in the  throat.  She then has to urge to clear her throat, which she does.  This  is helpful, and symptoms generally resolve.  She may have some symptoms  later in the day, tough not as noticeable.  She did undergo cardiac  evaluation with Dr. Alanda Amass.  I am told by the patient that she had a  negative stress test, EKG, as well as CT scan of the chest.  She has  also seen Dr. Alwyn Ren.  He placed her on Protonix, which she took  intermittently for 3 to 4 weeks.  She did not notice that this helped  her symptoms.  She does have occasional reflux symptoms as manifested by  heartburn and regurgitation.  She described these as infrequent.  As  well, occasional dysphagia to solids greater than liquids.  This  particular symptom seemed better on Protonix.  She does have a history  of sinus and allergy problems.  This has not been investigated as a  possible cause of symptoms.  She denies abdominal pain, weight loss,  change in bowel habits, melena, or hematochezia.  Her last colonoscopy  was performed in April of 2003.  This revealed pan diverticulosis.  Followup in 5 years recommended.  She acknowledges receiving recall  letter, and is interested in a followup exam.   PAST MEDICAL  HISTORY:  1. Dyslipidemia.  2. Depression.   PAST SURGICAL HISTORY:  1. Hysterectomy with incidental appendectomy.  2. Tubal ligation.   ALLERGIES:  ANECTINE (STOPPED BREATHING).   CURRENT MEDICATIONS:  1. Wellbutrin 150 mg daily.  2. Zocor unspecified dosage at night.  3. Calcium tablets.  4. Vitamin D.   FAMILY HISTORY:  Negative for gastrointestinal malignancy.   SOCIAL HISTORY:  Patient is married with 3 children.  She is a  [employer] trader.  She does not smoke, and occasionally uses alcohol.   PHYSICAL EXAMINATION:  Well-appearing female in no acute distress.  She  is alert and oriented.  Blood pressure is 102/64.  Heart rate is 88.  Weight is 153.4 pounds.  HEENT:  Sclerae are anicteric.  Conjunctivae are pink.  Oral mucosa is  intact.  Posterior pharynx is unremarkable.  There is no adenopathy.  LUNGS:  Clear.  HEART:  Regular.  ABDOMEN:  Soft without tenderness, mass, or hernia.  Good bowel sounds  heard.  EXTREMITIES:  Without edema.  IMPRESSION:  38. A 65 year old female with a 4 to 5 month history of early morning      chest tightness, soreness, and congestion relieved with      expectorating.  The cause of this is uncertain.  She certainly does      have some reflux symptoms, as well as mild dysphagia.  Whether this      is responsible for her chest symptoms, is unclear.  No improvement      on a short, somewhat erratic, course of proton pump inhibitor.  2. Diverticulosis.  3. Screening colonoscopy.  Due for followup.   RECOMMENDATIONS:  1. Upper endoscopy to evaluate reflux disease, dysphagia, and chest      complaints.  2. Followup screening colonoscopy.  The nature of the procedures, as      well as the risks, benefits, and alternatives have been reviewed.      She understood, and agreed to proceed.  We discussed different prep      options.  She requests osmo prep.  The importance of vigorous      hydration stressed.  3. After endoscopic evaluation is  completed, consider consistent long      trial of proton pump inhibitors (i.e., daily PPI for 3 months) to      see if this has impact on the symptoms.  If not, then Dr. Alwyn Ren      may consider other etiologies such as sinus or allergies.     Wilhemina Bonito. Marina Goodell, MD  Electronically Signed    JNP/MedQ  DD: 09/12/2007  DT: 09/13/2007  Job #: [id number]   cc:   Titus Dubin. Alwyn Ren, MD,FACP,FCCP  Richard A. Alanda Amass, M.D.

## 2011-04-07 NOTE — Discharge Summary (Signed)
NAMEKALENNA, MILLETT               ACCOUNT NO.:  [id number]   MEDICAL RECORD NO.:  [id number]          PATIENT TYPE:  INP   LOCATION:  3740                         FACILITY:  [facility]   PHYSICIAN:  Rene Paci, M.D. LHCDATE OF BIRTH:  26-Aug-1946   DATE OF ADMISSION:  04/09/2006  DATE OF DISCHARGE:                                 DISCHARGE SUMMARY   ADDENDUM  In lieu of Percocet, Vicodin has been called in to the patient's pharmacy,  [facility], at [phone].  Prescription for Vicodin 5/500 one tablet p.o. q.6h.  p.r.n. pain.  Prescription for #20 tablets requested.      Melissa S. Peggyann Juba, NP      Rene Paci, M.D. [facility]  Electronically Signed    MSO/MEDQ  D:  04/12/2006  T:  04/13/2006  Job:  [id number]

## 2011-04-07 NOTE — Discharge Summary (Signed)
Lisa Moody, RAPLEY               ACCOUNT NO.:  [id number]   MEDICAL RECORD NO.:  [id number]          PATIENT TYPE:  INP   LOCATION:  3740                         FACILITY:  [facility]   PHYSICIAN:  Rene Paci, M.D. LHCDATE OF BIRTH:  [birth year]   DATE OF ADMISSION:  04/09/2006  DATE OF DISCHARGE:  04/12/2006                                 DISCHARGE SUMMARY   DISCHARGE DIAGNOSES:  1.  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  2.  Status post abdominal liposuction, performed approximately 1 week ago.  3.  Iron deficiency anemia.   HISTORY OF PRESENT ILLNESS:  The patient is a 65 year old female admitted on  Apr 09, 2006 who on admission was 6 days status post abdominal liposuction.  She subsequently developed chest pain over the weekend and follow up with  Dr. Odis Luster.  The patient's chest pain improved but she was sent for imaging  which included chest x-ray which showed atelectasis and a CT which showed  acute pulmonary embolus.  She then presented to [facility] for  treatment.   PAST MEDICAL HISTORY:  None.  The patient has been in relatively good  health.   COURSE OF HOSPITALIZATION:  1.  Acute pulmonary embolus with probable pulmonary infarction.  The patient      was  admitted and Lovenox was started full-dose; Coumadin was also      initiated during this hospitalization.  She has remained very      hemodynamically stable with stable O2 sat during this admission.  She      also underwent a lower extremity Doppler which showed no evidence of DVT      bilaterally.  At time of discharge, the patient's INR is subtherapeutic      with a value of 1.2.  She will need to be continued on subcutaneous      Lovenox full-dose until INR is between 2.0 and 3.0 for a 48-hour      overlap.  The patient was instructed on the importance of compliance      with Lovenox and Coumadin.  The patient was very anxious for discharge      [place].  This was      discussed with the patient's primary care Tamyah Cutbirth who was agreeable to      this plan if logistics of home health can be arranged.  With assistance      of home health, [place]      to check daily PT/INRs and call these results to the covering [place]      physician.  2.  Iron deficiency anemia.  The patient was noted to be mildly anemic with      a hemoglobin of 10.  Her iron level was low with a value of 36.  She      will be continued on p.o. iron supplementation   DISCHARGE MEDICATIONS:  1.  Coumadin 5 mg p.o. daily.  The patient will be discharged with a  prescription for 1 mg tablets and is instructed to take five 1 mg tablet      daily.  This is to be adjusted by Dr. Alwyn Ren.  Prescription given for 60      tablets.  2.  Lovenox 80 mg injection.  The patient is instructed to inject 65 mg      subcutaneously every 12 hours until instructed to stop by Dr. Alwyn Ren.  3.  Iron 325 mg p.o. b.i.d.  4.  Wellbutrin 150 mg p.o. daily  5.  Percocet 5/325 one tablet p.o. q.4 h p.r.n. pain   PERTINENT LABORATORY DATA:  At discharge: INR today is 1.2.  Hemoglobin 10,  hematocrit 30.3.   DISPOSITION:  Plan to transfer the patient to home.  The patient is  instructed to follow up with Dr. Alwyn Ren on Tuesday, Apr 17, 2006, at 10:45  she is also instructed to follow up with Dr. Odis Luster as instructed.   The patient will be followed by [facility] at [phone]  who will perform daily PT/INR checks, and these results are to be called to  Dr. Alwyn Ren and covering physicians of [place].  The patient is instructed to  start Coumadin on Apr 13, 2006, as her dose will be given prior to discharge  today.  She is also instructed to continue Lovenox q.12 h.  She is also  instructed to go to the emergency room should she develop worsening chest  pain or shortness of breath.      Melissa S.  Peggyann Juba, NP      Rene Paci, M.D. [facility]  Electronically Signed    MSO/MEDQ  D:  04/12/2006  T:  04/13/2006  Job:  [id number]   cc:   Titus Dubin. Alwyn Ren, M.D. [facility]  [phone] [place]  [place] [address]

## 2011-04-07 NOTE — Assessment & Plan Note (Signed)
[facility]                        [place] OFFICE NOTE   NAME:Gravois, SUZANNA ZAHN                      MRN:          [id number]  DATE:02/06/2007                            DOB:          1945/12/29    Lisa Moody was seen 02/06/2007 complaining of left lower quadrant pain  since March 15.  The pain was present for hours and worse at night,  preventing sleep.  It was described as a dull, aching pain.  From March  16 to March 18 it was intermittent but subsequently constant.  It was  initiated in the left inferior flank but extended to the left lower  quadrant.  Tylenol had been of some benefit.   She had some associated nausea but denied dyspepsia or melena.  Genitourinary review of systems was negative.   In 2003 colonoscopy revealed diverticulosis.   She had been ingesting some nuts and popcorn prior to the onset of  symptoms, but no more than usual.   Her father had diverticulosis.   She has been off Coumadin for several months following pulmonary  thromboemboli in a postoperative setting.  She sustained a pulmonary  thromboembolism /infarct in May of 2007.  She had not completed the  coagulopathy studies following D/C of the coumadin as had been  discussed.   Additionally she was concerned about her lipid panel and requested it be  checked as she was fasting.   Weight was up 3 pounds to 149, temperature was 96.8, pulse 60 and  regular, respiratory rate 15, blood pressure 100/68.   She had no icterus or jaundice.  Chest was clear to auscultation.  She  had an S4.  She had no aortic aneurysm.  There was some tenderness in  the left lower quadrant.  She had no organomegaly or lymphadenopathy.  Skin was dry and warm; there was no rash.   Urinalysis revealed trace leukocytes and trace blood.  She was placed on  Flagyl and amoxicillin for possible diverticulitis.   The urine culture did reveal 70,000 colonies with E. coli and  Enterococcus both sensitive to amoxicillin.   Clotting studies revealed normal cardiolipin IgM and IgA.  Protein C was  elevated at 191 ( functional protein C).  Total protein C was 129,  within normal limits.  Factor VIII was mildly reduced at 21 with normals  over 57.  Antithrombin III was normal.  Her lupus anticoagulant panel  revealed mild elevation of 2 entities, but not a significant elevation.  Protein S functional was within normal limits, and a total was mildly  elevated at 156.  Protein 2 gene mutation was pending.   Her white count was 4200; differential was normal and there was no  anemia.   Total cholesterol was 229 with an LDL of 150.  Based on her NMR, her LDL  should be less than 125, and ideally less than 110.  Her A1c was 5.9,  upper limits of normal.   Initiation of pravastatin 40 mg at bedtime was discussed; she has  requested dietary manipulation with followup lipids after 4 months.  If  she is  not at goal at that time then the statin should be initiated.  Additionally it is recommended that she avoid the white hyperglycemic  cards.  The reference in Prevention Magazine the Flat Belly Diet which  refers to good fats was recommended, along with Dr. Gildardo Griffes book  Eat, Drink, and Be Healthy.  The A1c can be checked at that time as  well.  She was asked to preferentially ingest  unprocessed complex  carbs.   She was asked to complete the amoxicillin.  She had sopped the Flagyl  was  because of the appearance of a rash over the left flank.  This  suggests that the pain may have been related to subclinical herpes  zoster.  An antiviral agent will be initiated.     Titus Dubin. Alwyn Ren, MD,FACP,FCCP  Electronically Signed    [facility]/MedQ  DD: 02/11/2007  DT: 02/12/2007  Job #: [id number]

## 2011-04-07 NOTE — Op Note (Signed)
[facility]  Patient:    Lisa Moody, LARKIN                      MRN: [id number] Proc. Date: 08/06/00 Adm. Date:  [id number] Disc. Date: [id number] Attending:  Twana First                           Operative Report  PREOPERATIVE DIAGNOSES 1. Left knee anterior cruciate ligament deficiency. 2. Left knee medial and lateral meniscal tears. 3. Left knee post-traumatic degenerative joint disease.  POSTOPERATIVE DIAGNOSES 1. Left knee anterior cruciate ligament deficiency. 2. Left knee medial and lateral meniscal tears. 3. Left knee post-traumatic degenerative joint disease.  PROCEDURE 1. Left knee examination under anesthesia, followed by arthroscopic partial    medial and lateral meniscectomies. 2. Left knee tricompartmental chondroplasty. 3. Left knee arthroscopically assisted endoscopic bone-patellar-tendon-bone    allograft anterior cruciate ligament reconstruction using 9 x 25-mm femoral    interference BioScrew and 9 x 25-mm tibial interference BioScrew.  SURGEON:  Elana Alm. Thurston Hole, M.D.  ASSISTANT:  Kirstin Adelberger, P.A.  ANESTHESIA:  General.  OPERATIVE TIME:  One hour and 25 minutes.  COMPLICATIONS:  None.  INDICATION FOR PROCEDURE:  Ms. Lisa Moody is a 65 year old woman who has had significant ACL deficiency in her left knee for a long period of time but is having increasing problems with instability more than pain.  She has DJD in the knee posttraumatically, but this is not her main complaint; her main complaint is instability and thus, she is to undergo arthroscopy and ACL reconstruction.  DESCRIPTION OF PROCEDURE:  Ms. Lisa Moody was brought to the operating room on September 17, 64 and placed on the operating table in a supine position. After an adequate level of general anesthesia was obtained, her left knee was examined under anesthesia.  She had full range of motion.  She had 3+ Lachman, positive pivot shift and knee  stable to varus, valgus and posterior stress, with normal patellar tracking.  Left knee was sterilely injected with 0.25% Marcaine with epinephrine.  The left leg was then prepped using sterile Betadine and draped using sterile technique.  She received Ancef 1 g IV preoperatively for prophylaxis.  Initially, the arthroscopy was performed through an inferolateral portal, the arthroscope with a pump attachment was placed in through an inferomedial portal and arthroscopic probe was placed. On initial inspection of the medial compartment, she had 50-75% grade 4 changes on her medial femoral condyle and 25% grade 4 changes on her medial tibial plateau, which was debrided, with grade 3 changes over the rest of the tibial plateau and femoral condyle debrided.  Her medial meniscus rim, of which she only had about 30% remaining from her previous partial meniscectomy, also had some persistent tearing in the rim and another 50% of this rim was resected back to a stable edge.  The intercondylar notch was inspected.  The notch was extremely stenotic and this was thoroughly opened and a wide notchplasty was performed.  There was no remnant of the ACL remaining.  The PCL was intact.  There was significant anterior laxity noted.  The lateral compartment was inspected.  She had 40-50% grade 4 changes on her lateral femoral condyle and 25% grade 4 changes on her lateral tibial plateau, with the rest grade 3 changes; this was debrided.  Her lateral meniscus rim, also of which she had had a previous partial meniscectomy with  only about 30% remaining, had further tearing in it and another 50% of this was resected. Her patellofemoral joint was inspected.  The articular cartilage in the joint showed grade 3 changes on the patella in the femoral groove and this was debrided.  She had a rather large 1 x 1-cm bony loose body in the patellofemoral joint area and this was removed.  She had some significant spurring on  the inferior pole of her patella and this was resected as well with a bur.  Medial and lateral gutters showed moderate synovitis; they were debrided, otherwise, free of pathology.  At this point then, the ACL allograft was prepared with 10 x 25-mm of patellar bone and tibial tubercle bone and a 12-mm piece of patellar tendon was wrapped in a tube fashion and tied down. The tibial guidepin was then placed through a 1.5-cm anteromedial proximal tibial incision using the tibial guide and drilled up into the ACL insertion going on the tibial plateau.  This was then over-drilled with a 10-mm drill. Through this hole, the posterior femoral guide was placed into the posterior femoral notch and at this time, a pin drilled up in the ACL origin, going on the posterior femoral notch, and then overdrilled with a 10-mm drill to a depth of 35 mm, leaving a posterior 2-mm bone bridge.  A double-pin passer was brought up through the tibial hole and joint, through the femoral cortex through a stab wound.  This was used to pass the ACL allograft up through the tibial tunnel and joint into the femoral tunnel.  It was locked into position there with a 9 x 25-mm interference BioScrew.  The knee was then taken through a full range of motion.  There was no impingement on the graft.  The tibial bone plug was then locked into its tunnel with a 9 x 25-mm interference BioScrew, with the knee in 10 degrees of flexion and the tibial held reduced on the femur.  After this was done, the knee was tested for stability.  A Lachman and pivot shift were found to be eliminated and the knee could be brought through a full range of motion with no impingement in the graft.  At this point, the wounds were irrigated and closed using 2-0 Vicryl and 3-0 Prolene, the knee injected with 0.25% Marcaine with epinephrine and sterile dressings were applied.  A femoral nerve block was then performed by Dr. Maren Beach of anesthesia for  postoperative pain control.  The patient was then awakened and taken to the recovery room in stable condition.  FOLLOWUP CARE:  [name] will [facility] for IV pain control, neurovascular monitoring, CPM and ice machine use and discharged tomorrow on Percocet and Naprosyn with a home CPM and ice machine.  I will see her back in the office in a week for sutures out and followup. DD:  08/06/00 TD:  08/06/00 Job: [id number]

## 2011-04-07 NOTE — Op Note (Signed)
[place]  Patient:    Lisa Moody, Lisa Moody                      MRN: [id number] Proc. Date: 04/17/00 Adm. Date:  [id number] Disc. Date: [id number] Attending:  Twana First                           Operative Report  PREOPERATIVE DIAGNOSIS:  Right knee anterior cruciate ligament tear with medial meniscus tear.  POSTOPERATIVE DIAGNOSIS:  Right knee anterior cruciate ligament tear with medial meniscus tear.  OPERATION: 1. Right knee examination under anesthesia followed by arthroscopically    assisted endoscopic Allograft, patellar tendon bone, ACL reconstruction    using a 9 x 25 mm femoral interference bioscrew and an 8 x 25 mm tibial    inference bioscrew. 2. Right knee medial meniscal repair with Clear-Fix meniscal screws times    two.  SURGEON:  Elana Alm. Thurston Hole, M.D.  ASSISTANT:  Kirstin Adelberger, P.A.  ANESTHESIA:  General  OPERATIVE TIME:  One hour and forty minutes  COMPLICATIONS: None.  INDICATION FOR PROCEDURE: The patient is a 65 year old woman who sustained a right knee pivot shift injury approximately 2-1/2 months ago.  Significant pain, swelling and an MRI documenting a complete ACL tear with a meniscal tear, partial MCL tear.  She has significant instability and is now to undergo arthroscopy and ACL reconstruction.  DESCRIPTION OF PROCEDURE:  The patient is brought to the operating room on Apr 17, 2000 and placed on the operating table in the supine position.   After an adequate level of general anesthesia was obtained, her right knee was examined under anesthesia.  She had full range of motion.  She had a 3+ Lachman.  She had a positive pivot shift.  She was stable to varus valgus and posterior stress with normal patellar tracking.  After this was done, the knee was sterilely injected with 0.25% Marcaine with epinephrine.  The right leg was prepped using sterile Betadine and draped using sterile technique.   She received Ancef 1 gram IV preoperatively for prophylaxis.  Initially, the arthroscopy was performed through an inferior lateral portal.  The arthroscope with a pump attached was placed into an inferior medial portal and arthroscopic probe was placed.  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 The medial meniscus was probed.  She had an unstable posterior horn medial meniscus tear 3 cm in length.  This was felt to be amenable to a repair.  The meniscal rim was thoroughly debrided with a small arthroscopic debrider and then two separate 10 mm Clear-Fix meniscal screws were placed in this tear holding the torn portions firmly and in good apposition for healing.  The intercondylar notch was inspected.  The anterior cruciate ligament was found to be completely torn in its mid substance with significant anterior laxity.  The posterior cruciate was intact and stable.  The stump of the ACL were thoroughly debrided and a notchplasty was performed.  The lateral compartment was inspected. Articular cartilage, lateral femoral condyle and tibial plateau was normal.  The lateral meniscus was probed and this was found to be normal. Popliteus tendon was normal.  Patellar femoral joint inspected.  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  The femoral groove articular cartilage was intact and the patella tracked normally.  Moderate synovitis in the medial lateral gutters were debrided.  Otherwise they are free of pathology.  After this was done, then using a tibial drill guide a Steinmann pin was drilled up into the ACL insertion point on the tibial plateau through a 1.5 cm anterior medial proximal tibial incision.  Through this hole the posterior femoral guide was placed into  the posterior femoral notch and a Steinmann pin drilled up into the ACL origin point in the posterior femoral notch and then overdrilled with a 10 mm drill leaving a posterior 2 mm bone bridge.  Through this tibial hole the double pen pass was passed up through the tibial hole joint, femoral hole and through the femoral cortex and thigh through a stab wound.  The ACL Allograft was prepared with 10 mm x 28 mm of patellar bone and tibial tubercle bone and then a tubed 12 mm graft.  This graft was then passed up through the tibial hole and joint and into the femoral bone plug using the double pen passer.  It fitted firmly into the femoral tunnel and was locked into position there with a 9 x 25 mm interference bioscrew through a separate anterior medial 1 cm incision.  After this was done, the knee was taken through a full range of motion.  There was found to be no impingement of the graft.  The tibial bone plug was then locked into its tunnel with a 8 x 25 mm interference bioscrew with the knee in 10 degrees of flexion and the tibial held reduced on the femur.  After this was done, the graft in the knee was tested for stability.  The Lachman and pivot shift were found to be totally eliminated and the knee could be brought through a full range of motion with no impingement in the graft.  The meniscal repair was again checked and found to be stable.  At this point, it was felt that all pathology had been satisfactorily addressed.  The instruments were removed.  The wound was closed with interrupted 2-0 Vicryl and 3-0 Prolene. Steri-Strips were applied.  The wound was injected.  Sterile dressings were applied and the wound injected with 0.25% Marcaine with epinephrine.  Femoral nerve block was then placed by Dr. Krista Blue of anesthesia.  The patient was then awakened and taken to the recovery room in stable condition.  FOLLOWUP CARE:  The patient will be followed overnight through [facility] for IV pain control and neurovascular monitoring and CPM use. Discharged tomorrow on Percocet and Naprosyn with a home CPM and ice machine. Seen back in the office in a week for sutures out and follow up.  DD:  04/17/00 TD:  04/19/00 Job: [id number]

## 2011-04-07 NOTE — Letter (Signed)
March 05, 2007    Christianne Dolin, MD  [address] [place] [address]  [place], [place] [address]   RE:  ELHAM, FINI  MRN:  [id number]  /  DOB:  1946/09/20   Dear Dr. Emily Filbert:   Ms. Renovato noted a pruritic rash over the right buttock beginning the  evening of April 14.  She questioned shingles, as she was recently  treated for the same in March (see February 11, 2007 dictation, #[id number]).  She had presented on March 19 with left lower quadrant pain and  subsequently developed a rash over the posterior flank, which suggested  possible herpes zoster.  She received a full course of antiviral  medication.   She questions whether the present rash may have been related to the  ingestion of increased amounts of tomatoes as part of the [place]  Diet.   She denies any constitutional symptoms.   There are somewhat serpiginous, irregularly grouped plaque-like,  slightly erythematous lesions over the right buttock.  It encompasses  10x6 cm and represents approximately 16 separate lesions.   This does not have the appearance of zoster and I would expect a natural  immunity if indeed the dermatitis in March were zoster.   I did give her a list of the hyper-allergenic foods, such as  strawberries, tomatoes, chocolate, nuts, and shellfish.  I recommended  she avoid these until she sees you & appropriate evaluation completed  such as scraping or biopsy if necessary.  I will give her Mometasone  0.1% cream to use daily for the rash if the pruritis is intolerable.    Sincerely,      Titus Dubin. Alwyn Ren, MD,FACP,FCCP  Electronically Signed    [facility]  DD: 03/05/2007  DT: 03/05/2007  Job #: [id number]

## 2011-09-15 ENCOUNTER — Other Ambulatory Visit: Payer: Self-pay | Admitting: Obstetrics and Gynecology

## 2011-09-15 DIAGNOSIS — Z1231 Encounter for screening mammogram for malignant neoplasm of breast: Secondary | ICD-10-CM

## 2011-09-21 ENCOUNTER — Other Ambulatory Visit: Payer: Self-pay | Admitting: Obstetrics and Gynecology

## 2011-09-21 DIAGNOSIS — M8580 Other specified disorders of bone density and structure, unspecified site: Secondary | ICD-10-CM

## 2011-10-02 ENCOUNTER — Ambulatory Visit: Payer: BC Managed Care – PPO

## 2011-10-02 ENCOUNTER — Other Ambulatory Visit: Payer: Self-pay | Admitting: Dermatology

## 2011-10-02 ENCOUNTER — Other Ambulatory Visit: Payer: BC Managed Care – PPO

## 2011-10-02 ENCOUNTER — Ambulatory Visit
Admission: RE | Admit: 2011-10-02 | Discharge: 2011-10-02 | Disposition: A | Discharge status: Home / Self Care | Payer: BC Managed Care – PPO | Source: Ambulatory Visit | Attending: Obstetrics and Gynecology | Admitting: Obstetrics and Gynecology

## 2011-10-02 DIAGNOSIS — Z1231 Encounter for screening mammogram for malignant neoplasm of breast: Secondary | ICD-10-CM

## 2011-10-05 ENCOUNTER — Ambulatory Visit
Admission: RE | Admit: 2011-10-05 | Discharge: 2011-10-05 | Disposition: A | Discharge status: Home / Self Care | Payer: BC Managed Care – PPO | Source: Ambulatory Visit | Attending: Obstetrics and Gynecology | Admitting: Obstetrics and Gynecology

## 2011-10-05 DIAGNOSIS — M8580 Other specified disorders of bone density and structure, unspecified site: Secondary | ICD-10-CM

## 2011-10-26 ENCOUNTER — Encounter: Payer: Self-pay | Admitting: Internal Medicine

## 2011-10-26 ENCOUNTER — Ambulatory Visit (INDEPENDENT_AMBULATORY_CARE_PROVIDER_SITE_OTHER): Payer: BC Managed Care – PPO | Admitting: Internal Medicine

## 2011-10-26 ENCOUNTER — Ambulatory Visit: Payer: BC Managed Care – PPO

## 2011-10-26 VITALS — BP 116/78 | HR 89 | Temp 98.9°F | Wt 154.0 lb

## 2011-10-26 DIAGNOSIS — Z23 Encounter for immunization: Secondary | ICD-10-CM

## 2011-10-26 DIAGNOSIS — J069 Acute upper respiratory infection, unspecified: Secondary | ICD-10-CM

## 2011-10-26 MED ORDER — AMOXICILLIN 500 MG PO CAPS: 1000.0000 mg | ORAL_CAPSULE | Freq: Two times a day (BID) | ORAL | Status: AC

## 2011-10-26 NOTE — Patient Instructions (Signed)
Rest, fluids , tylenol For cough, take Mucinex DM twice a day as needed  For congestion use astepro samples: 2 sprays on each side of the nose at night until samples gone Take the antibiotic as prescribed  ( Amoxicillin) only if you are not getting better in 3 or 4 days  Call if no better in 10 days Call anytime if the symptoms are severe

## 2011-10-26 NOTE — Progress Notes (Signed)
  Subjective:    Patient ID: Lisa Moody, female    DOB: 1946-01-06, 65 y.o.   MRN: [id number]  HPI One-day history of chest congestion, tightness in the chest, cough, ear pressure. Has not taken any medication for her sx, afraid she is developing bronchitis, ?abx  Past medical history, medication list and allergies reviewed   Review of Systems Denies any fever or chills No nausea, vomiting, diarrhea. No myalgias or arthralgias    Objective:   Physical Exam  Constitutional: She is oriented to person, place, and time. She appears well-developed and well-nourished. No distress.  HENT:  Head: Normocephalic and atraumatic.  Right Ear: External ear normal.  Left Ear: External ear normal.       Throat without redness, face is symmetric, nose is slightly congested  Cardiovascular: Normal rate and regular rhythm.   No murmur heard. Pulmonary/Chest: Effort normal and breath sounds normal. No respiratory distress. She has no wheezes. She has no rales.  Neurological: She is alert and oriented to person, place, and time.  Skin: She is not diaphoretic.      Assessment & Plan:  URI: Sx c/w a URI, afraid is going to go into bronchitis---> antibiotics? See instructions. Also wonders if she could have the flu shot, I'll give it to her as she has no fever and we already have cases in the community

## 2012-02-29 DIAGNOSIS — H0230 Blepharochalasis unspecified eye, unspecified eyelid: Secondary | ICD-10-CM | POA: Insufficient documentation

## 2012-04-18 ENCOUNTER — Ambulatory Visit (INDEPENDENT_AMBULATORY_CARE_PROVIDER_SITE_OTHER): Payer: BC Managed Care – PPO | Admitting: Nurse Practitioner

## 2012-04-18 ENCOUNTER — Telehealth: Payer: Self-pay | Admitting: Internal Medicine

## 2012-04-18 ENCOUNTER — Encounter: Payer: Self-pay | Admitting: Nurse Practitioner

## 2012-04-18 VITALS — BP 92/64 | HR 88 | Ht 62.0 in | Wt 153.5 lb

## 2012-04-18 DIAGNOSIS — K5732 Diverticulitis of large intestine without perforation or abscess without bleeding: Secondary | ICD-10-CM

## 2012-04-18 DIAGNOSIS — K5792 Diverticulitis of intestine, part unspecified, without perforation or abscess without bleeding: Secondary | ICD-10-CM | POA: Insufficient documentation

## 2012-04-18 MED ORDER — AMOXICILLIN-POT CLAVULANATE 875-125 MG PO TABS: 1.0000 | ORAL_TABLET | Freq: Two times a day (BID) | ORAL | Status: AC

## 2012-04-18 NOTE — Progress Notes (Signed)
Lisa Moody [id number] 08/06/1946   HISTORY OR PRESENT ILLNESS : Patient is a 66 y.o. female known to Dr. Marina Goodell for a history of GERD, adenomatous colon polyps, and diverticulitis. She was last seen in April 2012 at which time she was doing much better after an episode of diverticulitis. Patient has done well from a GI standpoint since that time. Over the last few days however patient has developed recurrent left lower quadrant pain reminiscent of diverticulitis. No fevers. She has been slightly more constipated than normal. No urinary symptoms.   Patient's last colonoscopy was January 2009 with pandiverticulosis and diminutive polyps. Routine followup planned 5 years from that date.   Current Medications, Allergies, Past Medical History, Past Surgical History, Family History and Social History were reviewed in [employer] record.   PHYSICAL EXAMINATION : General:  Well developed  female in no acute distress Head: Normocephalic and atraumatic Eyes:  sclerae anicteric,conjunctive pink. Ears: Normal auditory acuity Neck: Supple, no masses.  Lungs: Clear throughout to auscultation Heart: Regular rate and rhythm; no murmurs heard Abdomen: Soft, nondistended, mild LLQ tenderness. No masses or hepatomegaly noted. Normal bowel sounds Rectal: not done Musculoskeletal: Symmetrical with no gross deformities  Skin: No lesions on visible extremities Extremities: No edema or deformities noted Neurological: Oriented, grossly nonfocal Cervical Nodes:  No significant cervical adenopathy Psychological:  Alert and cooperative. Normal mood and affect  ASSESSMENT AND PLAN :   left lower quadrant pain, suspect recurrent diverticulitis though could be constipation. Patient will purge bowels with Miralax. If pain doesn't improve she will take a 10 day course of Augmentin (doesn't want Flagyl) . She will follow up with Dr. Marina Goodell. In the meantime patient will call if pain doesn't  resolve or she develops fever or other symptoms.

## 2012-04-18 NOTE — Telephone Encounter (Signed)
Pt states she is having a diverticulitis flare and requests to be seen. Pt scheduled to see Willette Cluster NP today at 2:30pm. Pt aware of appt date and time.

## 2012-04-18 NOTE — Patient Instructions (Signed)
We have given you a prescription for Augmentin  To take twice daily for 10 days. Drink clear liquids for several days then advance to a low residue diet.  We have given you a brochure.  We made you an appointment for 3-4 weeks out.with Dr. Yancey Flemings on 05-27-2012 at 10 AM.

## 2012-04-19 ENCOUNTER — Encounter: Payer: Self-pay | Admitting: Nurse Practitioner

## 2012-04-23 NOTE — Progress Notes (Signed)
Agree with Ms. Guenther's assessment and plan. Lonnetta Kniskern E. Anchor Dwan, MD, FACG   

## 2012-05-13 ENCOUNTER — Telehealth: Payer: Self-pay

## 2012-05-13 DIAGNOSIS — T887XXA Unspecified adverse effect of drug or medicament, initial encounter: Secondary | ICD-10-CM

## 2012-05-13 DIAGNOSIS — E785 Hyperlipidemia, unspecified: Secondary | ICD-10-CM

## 2012-05-13 NOTE — Telephone Encounter (Signed)
I called patient, Dr.Hopper was standing at the ledge at the time of call and left message with results below. Patient to call after 10 weeks of dietary change to recheck labs (future orders placed)

## 2012-05-13 NOTE — Telephone Encounter (Signed)
Message copied by Maurice Small on Mon May 13, 2012 10:09 AM ------      Message from: Pecola Lawless      Created: Sun May 12, 2012 11:55 AM              Please review Dr Gildardo Griffes book Eat, Drink & Be Healthy for dietary cholesterol information. Please  schedule fasting Labs in 10 weeks   after these dietary changes :CK, AST,ALT, NMR Lipoprofile Lipid Panel to optimally assess LDL risk.  Codes: 272.4,995.20.

## 2012-05-27 ENCOUNTER — Encounter: Payer: Self-pay | Admitting: Internal Medicine

## 2012-05-27 ENCOUNTER — Ambulatory Visit (INDEPENDENT_AMBULATORY_CARE_PROVIDER_SITE_OTHER): Payer: BC Managed Care – PPO | Admitting: Internal Medicine

## 2012-05-27 VITALS — BP 92/66 | HR 68 | Ht 62.5 in | Wt 155.4 lb

## 2012-05-27 DIAGNOSIS — K219 Gastro-esophageal reflux disease without esophagitis: Secondary | ICD-10-CM

## 2012-05-27 DIAGNOSIS — Z86010 Personal history of colonic polyps: Secondary | ICD-10-CM

## 2012-05-27 DIAGNOSIS — K5792 Diverticulitis of intestine, part unspecified, without perforation or abscess without bleeding: Secondary | ICD-10-CM

## 2012-05-27 DIAGNOSIS — K5732 Diverticulitis of large intestine without perforation or abscess without bleeding: Secondary | ICD-10-CM

## 2012-05-27 DIAGNOSIS — K5900 Constipation, unspecified: Secondary | ICD-10-CM

## 2012-05-27 NOTE — Progress Notes (Signed)
HISTORY OF PRESENT ILLNESS:  Lisa Moody is a 66 y.o. female who is followed in this office for GERD, adenomatous colon polyps, diverticulosis with a history of diverticulitis, and constipation. Her last colonoscopy was in January 2009 with small adenomas removed. She was seen by our nurse practitioner on 04/18/2012 regarding left lower quadrant pain reminiscent of diverticulitis. Also having constipation at the time. She was treated with MiraLax and a 10 day course of Augmentin. Several days and antibiotics, symptoms resolved. She has had no further issues. Her bowels are regulated on MiraLax. She does have questions today regarding her PPI therapy. Her last upper endoscopy revealed mild esophagitis. Symptoms included globus, pyrosis, chest discomfort. She has been having some problems with headache for which she saw her neurologist. He suggested that Protonix may be the cause for headaches. She stopped the medication. She has been off for about 2 months. She has had no recurrent GI symptoms off medication. Swallowing normally. She wonders if she needs to go back on the medication. REVIEW OF SYSTEMS:  All non-GI ROS negative   Past Medical History  Diagnosis Date  . Benign neoplasm of colon   . Esophagitis, unspecified   . Other and unspecified hyperlipidemia   . Other constipation   . Diverticulosis of colon (without mention of hemorrhage)   . Personal history of other mental disorder   . Esophageal reflux   . Other and unspecified hyperlipidemia   . Pulmonary embolism     2006    Past Surgical History  Procedure Date  . Partial hysterectomy   . Appendectomy   . Tubal ligation   . Lumbar laminectomy   . Abdominoplasty   . Total knee arthroplasty     Social History Lisa Moody  reports that she has never smoked. She has never used smokeless tobacco. She reports that she drinks alcohol. She reports that she does not use illicit drugs.  family history includes Breast cancer  in her mother; Lung cancer in her father; and Tics in her father.  Allergies  Allergen Reactions  . Succinylcholine        PHYSICAL EXAMINATION: Vital signs: BP 92/66  Pulse 68  Ht 5' 2.5" (1.588 m)  Wt 155 lb 6.4 oz (70.489 kg)  BMI 27.97 kg/m2 General: Well-developed, well-nourished, no acute distress HEENT: Sclerae are anicteric, conjunctiva pink. Oral mucosa intact Lungs: Clear Heart: Regular Abdomen: soft, nontender, nondistended, no obvious ascites, no peritoneal signs, normal bowel sounds. No organomegaly. Extremities: No edema Psychiatric: alert and oriented x3. Cooperative    ASSESSMENT:  #1. Recent bout of diverticulitis resolved with Augmentin #2. GERD. Asymptomatic off PPI #3. History of adenomatous colon polyps. Due for surveillance January 2014 #4. Constipation. Managed with MiraLax   PLAN:  #1. Continue MiraLax as needed to regulate bowels #2. Okay to stay off PPI. She may use on demand. We discussed. #3. Surveillance colonoscopy January 2014

## 2012-05-27 NOTE — Patient Instructions (Addendum)
Please follow up with Dr. Perry as needed 

## 2012-07-12 ENCOUNTER — Encounter: Payer: Self-pay | Admitting: Internal Medicine

## 2012-07-12 ENCOUNTER — Ambulatory Visit (INDEPENDENT_AMBULATORY_CARE_PROVIDER_SITE_OTHER): Payer: BC Managed Care – PPO | Admitting: Internal Medicine

## 2012-07-12 VITALS — BP 114/80 | HR 102 | Temp 99.0°F | Wt 158.4 lb

## 2012-07-12 DIAGNOSIS — R0789 Other chest pain: Secondary | ICD-10-CM

## 2012-07-12 DIAGNOSIS — R509 Fever, unspecified: Secondary | ICD-10-CM

## 2012-07-12 DIAGNOSIS — J029 Acute pharyngitis, unspecified: Secondary | ICD-10-CM

## 2012-07-12 DIAGNOSIS — R000 Tachycardia, unspecified: Secondary | ICD-10-CM

## 2012-07-12 LAB — CBC WITH DIFFERENTIAL/PLATELET
Basophils Absolute: 0 10*3/uL (ref 0.0–0.1)
Basophils Relative: 0 % (ref 0–1)
MCHC: 33.9 g/dL (ref 30.0–36.0)
Monocytes Absolute: 1 10*3/uL (ref 0.1–1.0)
Neutro Abs: 7.6 10*3/uL (ref 1.7–7.7)
Neutrophils Relative %: 72 % (ref 43–77)
Platelets: 274 10*3/uL (ref 150–400)
RDW: 14.9 % (ref 11.5–15.5)
WBC: 10.7 10*3/uL — ABNORMAL HIGH (ref 4.0–10.5)

## 2012-07-12 LAB — D-DIMER, QUANTITATIVE: D-Dimer, Quant: 0.53 ug/mL-FEU — ABNORMAL HIGH (ref 0.00–0.48)

## 2012-07-12 NOTE — Addendum Note (Signed)
Addended by: Mauri Reading on: 07/12/2012 03:41 PM   Modules accepted: Orders

## 2012-07-12 NOTE — Progress Notes (Signed)
  Subjective:    Patient ID: Lisa Moody, female    DOB: 05/04/1946, 66 y.o.   MRN: [id number]  HPI She began to experience malaise 07/08/12 followed by a scratchy, sore throat. She's also had some chest tightness during this period which occurs with bronchitic episodes. She has had some gum discomfort without frank dental pain. She's also had some discomfort in her ears without discharge..  She tends to have some white secretions from her throat early in the morning which is a chronic rather than acute problem.  She will be attending a family social event this weekend  Past medical history/family history/social history were all reviewed and updated. Pertinent data: DVT /PTE post op    Review of Systems  There was no prodrome of extrinsic symptoms such as itchy, watery eyes, or sneezing. She denies frontal headache, facial pain, nasal purulence, sputum production, wheezing or shortness of breath. She is not having fever, chills, or sweats. She denies dyspnea, edema, claudication, or paroxysmal nocturnal dyspnea.  She has had an epidural steroid injection within the last 2 weeks     Objective:   Physical Exam General appearance:good health ;well nourished; no acute distress or increased work of breathing is present.  No  lymphadenopathy about the head, neck, or axilla noted.   Eyes: No conjunctival inflammation or lid edema is present. There is no scleral icterus.  Ears:  External ear exam shows no significant lesions or deformities.  Otoscopic examination reveals clear canals, tympanic membranes are intact bilaterally without bulging, retraction, inflammation or discharge.  Nose:  External nasal examination shows no deformity or inflammation. Nasal mucosa are pink and moist without lesions or exudates. No septal dislocation or deviation.No obstruction to airflow.   Oral exam: Dental hygiene is good; lips and gums are healthy appearing.There is minimal oropharyngeal erythema . No  exudate noted.   Neck:  No deformities, thyromegaly, masses, or tenderness noted.   No neck vein distention at 10  Heart:  Slightly increased tachycardia; regular rhythm. S1 and S2 normal without gallop, murmur, click, rub . S4 gallop  Lungs:Chest clear to auscultation; no wheezes, rhonchi,rales ,or rubs present.No increased work of breathing.    Extremities:  No cyanosis, edema, or clubbing  noted. Homans sign is negative  Abdomen: Normal bowel sounds; no organomegaly or masses. No hepatojugular reflux present   Skin: Warm & dry w/o jaundice or tenting.          Assessment & Plan:  #1 respiratory tract illness with pharyngitis, fever, and chest tightness. Beta strep is negative and no significant valvular murmurs present.  #2 tachycardia with S4 gallop. EKG revealed a heart rate of 86. Compared to 08/30/06 there is some decrease in the voltage of the T waves in  leads 1, 2 and aVL. It is also decreased  in lead V2. The other V. leads show a low to flat T wave which is probably unchanged. There is no definite ischemic change and no sign of pericarditis. One PAC was present.  Plan: Cardiac enzymes

## 2012-07-12 NOTE — Patient Instructions (Addendum)
Zicam Melts or Zinc lozenges ; vitamin C 2000 mg daily; & Echinacea for 4-7 days. Report fever, exudate("pus") or progressive pain.  Please take enteric-coated aspirin 81 mg daily with breakfast.  NSAIDS ( Aleve, Advil, Naproxen) or Tylenol every 4 hrs as needed for fever as discussed based on label recommendations  Stay well hydrated. Drink to thirst up to 40 ounces of fluids daily.   If you activate My Chart; the results can be released to you as soon as they populate from the lab. If you choose not to use this program; the labs have to be reviewed, copied & mailed   causing a delay in getting the results to you.

## 2012-07-13 LAB — CK TOTAL AND CKMB (NOT AT ARMC)
CK, MB: 1.2 ng/mL (ref 0.3–4.0)
Total CK: 24 U/L (ref 7–177)

## 2012-07-13 LAB — TROPONIN I

## 2012-10-23 ENCOUNTER — Ambulatory Visit (INDEPENDENT_AMBULATORY_CARE_PROVIDER_SITE_OTHER): Payer: BC Managed Care – PPO | Admitting: Internal Medicine

## 2012-10-23 VITALS — BP 114/82 | HR 86 | Temp 98.6°F | Wt 156.0 lb

## 2012-10-23 DIAGNOSIS — J0110 Acute frontal sinusitis, unspecified: Secondary | ICD-10-CM

## 2012-10-23 MED ORDER — CEFUROXIME AXETIL 500 MG PO TABS: 500.0000 mg | ORAL_TABLET | Freq: Two times a day (BID) | ORAL | Status: DC

## 2012-10-23 MED ORDER — FLUTICASONE PROPIONATE 50 MCG/ACT NA SUSP: 1.0000 | Freq: Two times a day (BID) | NASAL | Status: DC | PRN

## 2012-10-23 NOTE — Progress Notes (Signed)
  Subjective:    Patient ID: Lisa Moody, female    DOB: 05/08/1946, 66 y.o.   MRN: [id number]  HPI Symptoms began 12/1 as head congestion and scratchy throat. This was followed by yellow nasal discharge with traces of blood. She had some sneezing. She's also had significant frontal headache and earache.  She has used Alka-Seltzer plus over-the-counter with minimal response    Review of Systems She denies itchy or watery eyes. She is not had significant facial pain or otic discharge. Cough is not a major symptom and she denies shortness of breath and wheezing.  She also denies fever, chills, or sweats.     Objective:   Physical Exam General appearance:good health ;well nourished; no acute distress or increased work of breathing is present.  No  lymphadenopathy about the head, neck, or axilla noted.   Eyes: No conjunctival inflammation or lid edema is present.   Ears:  External ear exam shows no significant lesions or deformities.  Otoscopic examination reveals clear canals, tympanic membranes are intact bilaterally without bulging, retraction, inflammation or discharge.  Nose:  External nasal examination shows no deformity or inflammation. Nasal mucosa are dry without lesions or exudates. No septal dislocation or deviation.Minimal L obstruction to airflow.   Oral exam: Dental hygiene is good; lips and gums are healthy appearing.There is no oropharyngeal erythema or exudate noted. Osteoma of hard palate  Neck:  No deformities,  masses, or tenderness noted.      Heart:  Normal rate and regular rhythm. S1 and S2 normal without gallop, murmur, click, rub or other extra sounds. S4  Lungs:Chest clear to auscultation; no wheezes, rhonchi,rales ,or rubs present.No increased work of breathing.    Extremities:  No cyanosis, edema, or clubbing  noted    Skin: Warm & dry          Assessment & Plan:  #1 frontal sinusitis  Plan: See orders & recommendations

## 2012-10-23 NOTE — Patient Instructions (Addendum)
Plain Mucinex for thick secretions ;force NON dairy fluids . Use a Neti pot daily as needed for sinus congestion; going from open side to congested side . Nasal cleansing in the shower as discussed. Make sure that all residual soap is removed to prevent irritation. Fluticasone 1 spray in each nostril twice a day as needed. Use the "crossover" technique as discussed. Plain Allegra 160 daily as needed for itchy eyes & sneezing.    

## 2012-11-22 ENCOUNTER — Other Ambulatory Visit: Payer: Self-pay | Admitting: Neurosurgery

## 2012-11-28 ENCOUNTER — Encounter: Payer: Self-pay | Admitting: Internal Medicine

## 2012-11-28 ENCOUNTER — Ambulatory Visit (INDEPENDENT_AMBULATORY_CARE_PROVIDER_SITE_OTHER): Payer: BC Managed Care – PPO | Admitting: Internal Medicine

## 2012-11-28 VITALS — BP 122/78 | HR 74 | Temp 98.3°F

## 2012-11-28 DIAGNOSIS — Z86718 Personal history of other venous thrombosis and embolism: Secondary | ICD-10-CM

## 2012-11-28 DIAGNOSIS — Z23 Encounter for immunization: Secondary | ICD-10-CM

## 2012-11-28 DIAGNOSIS — M899 Disorder of bone, unspecified: Secondary | ICD-10-CM

## 2012-11-28 DIAGNOSIS — J310 Chronic rhinitis: Secondary | ICD-10-CM

## 2012-11-28 DIAGNOSIS — T887XXA Unspecified adverse effect of drug or medicament, initial encounter: Secondary | ICD-10-CM

## 2012-11-28 NOTE — Patient Instructions (Addendum)
Plain Mucinex for thick secretions ;force NON dairy fluids . Use a Neti pot daily as needed for sinus congestion; going from open side to congested side . Nasal cleansing in the shower as discussed. Make sure that all residual soap is removed to prevent irritation. Fluticasone 1 spray in each nostril twice a day as needed. Use the "crossover" technique as discussed. Plain Allegra 160 daily as needed for itchy eyes & sneezing.    The dose of prophylactic Xarelto will be determined by your kidney function.  If you activate My Chart; the results can be released to you as soon as they populate from the lab. If you choose not to use this program; the labs have to be reviewed, copied & mailed   causing a delay in getting the results to you.

## 2012-11-28 NOTE — Progress Notes (Signed)
  Subjective:    Patient ID: Lisa Moody, female    DOB: 08/27/1946, 67 y.o.   MRN: [id number]  HPI The respiratory tract symptoms have been  persistent rhinitis; it became yellow  & bloody in past week. Also some intermittent bilateral ear pressure. All lung symptoms resolved with prior Rx     Cough was not associated with significant sputum, shortness of breath or wheezing  Sputum was described as scant yellow with traces of red      Treatment with  nasal cleansing & nasal steroid restarted 1/8 has been partially effective  There is no history of asthma. The patient had never smoked significantly                Review of Systems Symptoms not present included frontal headache, facial pain, dental pain, sore throat, nasal purulence, or otic discharge  Fever , chills and sweats not present   Itchy , watery eyes & sneezing were not noted  She is scheduled to fly to [place] 12/16/12; she will be there 6 ninths. She had a pulmonary thromboembolism following abdominal surgery. She questioned using Lovenox during the travel period.  She's also scheduled for pending neurosurgical procedure of her back. Her neurosurgeon has requested a vitamin D level preoperatively.       Objective:   Physical Exam General appearance:good health ;well nourished; no acute distress or increased work of breathing is present.  No  lymphadenopathy about the head, neck, or axilla noted.  Eyes: No conjunctival inflammation or lid edema is present. There is no scleral icterus. Ears:  External ear exam shows no significant lesions or deformities.  Otoscopic examination reveals clear canals, tympanic membranes are intact bilaterally without bulging, retraction, inflammation or discharge. Nose:  External nasal examination shows no deformity or inflammation. Nasal mucosa are pink and moist without lesions or exudates. No septal dislocation or deviation.No obstruction to airflow.  Oral exam: Dental  hygiene is good; lips and gums are healthy appearing.There is no oropharyngeal erythema or exudate noted.  Neck:  No deformities, thyromegaly, masses, or tenderness noted.   Supple with full range of motion without pain.  Heart:  Normal rate and regular rhythm. S1 and S2 normal without gallop, murmur, click, rub or other extra sounds.  Lungs:Chest clear to auscultation; no wheezes, rhonchi,rales ,or rubs present.No increased work of breathing.   Extremities:  No cyanosis, edema, or clubbing  noted  Skin: Warm & dry w/o jaundice or tenting.          Assessment & Plan:  #1 rhinitis without clinical criteria for rhinosinusitis. Aggressive nasal hygiene recommended. Antibiotics will be prescribed if symptoms persist or progress pre travel.  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 Prophylactic anticoagulation indicated during the travel period. See Dr Cyndie Chime pre operatively.  #3 osteoporosis; vitamin D level be ordered

## 2012-11-29 LAB — BASIC METABOLIC PANEL
BUN: 23 mg/dL (ref 6–23)
CO2: 25 mEq/L (ref 19–32)
Chloride: 108 mEq/L (ref 96–112)
Creatinine, Ser: 0.6 mg/dL (ref 0.4–1.2)

## 2012-12-01 LAB — VITAMIN D 1,25 DIHYDROXY
Vitamin D 1, 25 (OH)2 Total: 73 pg/mL — ABNORMAL HIGH (ref 18–72)
Vitamin D3 1, 25 (OH)2: 73 pg/mL

## 2012-12-11 ENCOUNTER — Other Ambulatory Visit: Payer: Self-pay | Admitting: Internal Medicine

## 2012-12-11 DIAGNOSIS — Z86711 Personal history of pulmonary embolism: Secondary | ICD-10-CM

## 2012-12-11 DIAGNOSIS — Z86718 Personal history of other venous thrombosis and embolism: Secondary | ICD-10-CM

## 2012-12-11 MED ORDER — RIVAROXABAN 10 MG PO TABS: ORAL_TABLET | ORAL | Status: DC

## 2012-12-18 ENCOUNTER — Encounter: Payer: Self-pay | Admitting: Internal Medicine

## 2012-12-27 ENCOUNTER — Other Ambulatory Visit (HOSPITAL_COMMUNITY): Payer: BC Managed Care – PPO

## 2013-01-06 ENCOUNTER — Other Ambulatory Visit (HOSPITAL_COMMUNITY): Payer: BC Managed Care – PPO

## 2013-01-14 ENCOUNTER — Other Ambulatory Visit: Payer: Self-pay | Admitting: Obstetrics and Gynecology

## 2013-01-14 ENCOUNTER — Ambulatory Visit (INDEPENDENT_AMBULATORY_CARE_PROVIDER_SITE_OTHER): Payer: BC Managed Care – PPO | Admitting: *Deleted

## 2013-01-14 DIAGNOSIS — Z23 Encounter for immunization: Secondary | ICD-10-CM

## 2013-01-14 DIAGNOSIS — Z1231 Encounter for screening mammogram for malignant neoplasm of breast: Secondary | ICD-10-CM

## 2013-01-15 ENCOUNTER — Other Ambulatory Visit: Payer: Self-pay | Admitting: Neurosurgery

## 2013-01-15 DIAGNOSIS — M8580 Other specified disorders of bone density and structure, unspecified site: Secondary | ICD-10-CM

## 2013-01-18 HISTORY — PX: LUMBAR FUSION: SHX111

## 2013-01-24 ENCOUNTER — Encounter (HOSPITAL_COMMUNITY): Payer: Self-pay | Admitting: Pharmacy Technician

## 2013-01-27 ENCOUNTER — Encounter (HOSPITAL_COMMUNITY): Admission: RE | Admit: 2013-01-27 | Payer: Medicare Other | Source: Ambulatory Visit

## 2013-02-05 ENCOUNTER — Encounter (HOSPITAL_COMMUNITY): Payer: Self-pay

## 2013-02-05 ENCOUNTER — Encounter (HOSPITAL_COMMUNITY)
Admission: RE | Admit: 2013-02-05 | Discharge: 2013-02-05 | Disposition: A | Discharge status: Home / Self Care | Payer: BC Managed Care – PPO | Source: Ambulatory Visit | Attending: Neurosurgery | Admitting: Neurosurgery

## 2013-02-05 DIAGNOSIS — Z01818 Encounter for other preprocedural examination: Secondary | ICD-10-CM | POA: Insufficient documentation

## 2013-02-05 DIAGNOSIS — Z01812 Encounter for preprocedural laboratory examination: Secondary | ICD-10-CM | POA: Insufficient documentation

## 2013-02-05 HISTORY — DX: Other complications of anesthesia, initial encounter: T88.59XA

## 2013-02-05 HISTORY — DX: Headache: R51

## 2013-02-05 HISTORY — DX: Other specified postprocedural states: R11.2

## 2013-02-05 HISTORY — DX: Palpitations: R00.2

## 2013-02-05 HISTORY — DX: Unspecified osteoarthritis, unspecified site: M19.90

## 2013-02-05 HISTORY — DX: Adverse effect of unspecified anesthetic, initial encounter: T41.45XA

## 2013-02-05 HISTORY — DX: Other specified postprocedural states: Z98.890

## 2013-02-05 HISTORY — DX: Nausea with vomiting, unspecified: R11.2

## 2013-02-05 LAB — CBC
Hemoglobin: 13.5 g/dL (ref 12.0–15.0)
MCH: 29.9 pg (ref 26.0–34.0)
MCHC: 33.8 g/dL (ref 30.0–36.0)
Platelets: 222 10*3/uL (ref 150–400)
RDW: 13.8 % (ref 11.5–15.5)

## 2013-02-05 LAB — BASIC METABOLIC PANEL
Calcium: 9.5 mg/dL (ref 8.4–10.5)
GFR calc Af Amer: >90 mL/min (ref >90)
GFR calc non Af Amer: 87 mL/min — ABNORMAL LOW (ref >90)
Potassium: 4.1 mEq/L (ref 3.5–5.1)
Sodium: 138 mEq/L (ref 135–145)

## 2013-02-05 LAB — SURGICAL PCR SCREEN
MRSA, PCR: NEGATIVE
Staphylococcus aureus: NEGATIVE

## 2013-02-05 LAB — TYPE AND SCREEN
ABO/RH(D): O POS
Antibody Screen: NEGATIVE

## 2013-02-05 NOTE — Pre-Procedure Instructions (Signed)
Lisa Moody  02/05/2013   Your procedure is scheduled on:  Thursday, March 27th  Report to [facility] at 0530 AM.  Call this number if you have problems the morning of surgery: [phone]   Remember:   Do not eat food or drink liquids after midnight.    Take these medicines the morning of surgery with A SIP OF WATER: protonix, flonase   Do not wear jewelry, make-up or nail polish.  Do not wear lotions, powders, or perfumes,deodorant.  Do not shave 48 hours prior to surgery  Do not bring valuables to the hospital.  Contacts, dentures or bridgework may not be worn into surgery.  Leave suitcase in the car. After surgery it may be brought to your room.  For patients admitted to the hospital, checkout time is 11:00 AM the day of discharge.   Patients discharged the day of surgery will not be allowed to drive home.    Special Instructions: Shower using CHG 2 nights before surgery and the night before surgery.  If you shower the day of surgery use CHG.  Use special wash - you have one bottle of CHG for all showers.  You should use approximately 1/3 of the bottle for each shower.   Please read over the following fact sheets that you were given: Pain Booklet, Coughing and Deep Breathing, Blood Transfusion Information, MRSA Information and Surgical Site Infection Prevention

## 2013-02-05 NOTE — Progress Notes (Addendum)
Primary Physician - Dr. Alwyn Ren Cardiologist - Dr. Alanda Amass as needed Stress test and cath several years ago - requested records EKG in epic from 2013  Patient on lovenox injections prior to surgery to stop injections on 02/11/13.

## 2013-02-06 ENCOUNTER — Encounter (HOSPITAL_COMMUNITY): Payer: Self-pay

## 2013-02-06 ENCOUNTER — Ambulatory Visit: Payer: BC Managed Care – PPO

## 2013-02-06 NOTE — Progress Notes (Signed)
Anesthesia chart review: Patient is a 67 year old female scheduled for L4-5 decompression with PLIF by Dr. Newell Coral on 02/13/2013. History includes post-operative PE '07 following liposuction, GERD, arthritis, headaches, HLD, diverticulosis, palpitations, lumbar laminectomy, left TKR 10/09/10. PCP is Dr. Marga Melnick.  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  She has been evaluated by cardiologist Dr. Alanda Amass [facility]) in the past, last 09/15/10.  She had an abnormal stress test which was felt to be false positive following a cardiac cath on 09/06/10 that showed normal coronaries and LV function.  EKG on 07/12/12 showed NSR, occasional PACs, non-specific T wave abnormality.  Her last echo was in 2004 and was WNL, EF 60% ([facility]).   CXR was not done at her PAT appointment.  With history of PE, may be helpful to have a more recent baseline.   Preoperative labs noted. She is on Lovenox injections with instructions to hold on 02/11/13. Plan PT/PTT on arrival.  She will be evaluated by has assigned anesthesiologist on the day of surgery to discuss the definitive anesthesia plan.  Velna Ochs [facility]/Anesthesiology Phone [phone] 02/06/2013 4:37 PM

## 2013-02-06 NOTE — Anesthesia Preprocedure Evaluation (Addendum)
Anesthesia Evaluation  Patient identified by MRN, date of birth, ID band Patient awake  General Assessment Comment:History reviewed with patient. Husband over the phone prior to surgery regarding anectine history. Case discussed with Dr. Newell Coral. CE   Reviewed: Allergy & Precautions, H&P , NPO status   History of Anesthesia Complications (+) PSEUDOCHOLINESTERASE DEFICIENCY  Airway Mallampati: II      Dental   Pulmonary neg pulmonary ROS,  breath sounds clear to auscultation        Cardiovascular negative cardio ROS  Rhythm:Regular     Neuro/Psych    GI/Hepatic Neg liver ROS, GERD-  ,  Endo/Other  negative endocrine ROS  Renal/GU negative Renal ROS     Musculoskeletal   Abdominal   Peds  Hematology   Anesthesia Other Findings   Reproductive/Obstetrics                        Anesthesia Physical Anesthesia Plan  ASA: III  Anesthesia Plan: General   Post-op Pain Management:    Induction: Intravenous  Airway Management Planned: Oral ETT  Additional Equipment:   Intra-op Plan:   Post-operative Plan: Extubation in OR  Informed Consent:   Dental advisory given  Plan Discussed with: CRNA, Anesthesiologist and Surgeon  Anesthesia Plan Comments:         Anesthesia Quick Evaluation

## 2013-02-07 ENCOUNTER — Ambulatory Visit
Admission: RE | Admit: 2013-02-07 | Discharge: 2013-02-07 | Disposition: A | Discharge status: Home / Self Care | Payer: BC Managed Care – PPO | Source: Ambulatory Visit | Attending: Obstetrics and Gynecology | Admitting: Obstetrics and Gynecology

## 2013-02-07 DIAGNOSIS — Z1231 Encounter for screening mammogram for malignant neoplasm of breast: Secondary | ICD-10-CM

## 2013-02-12 MED ORDER — CEFAZOLIN SODIUM-DEXTROSE 2-3 GM-% IV SOLR
2.0000 g | INTRAVENOUS | Status: AC
  Administered 2013-02-13 (×2): 2 g via INTRAVENOUS
  Filled 2013-02-12: qty 50

## 2013-02-13 ENCOUNTER — Inpatient Hospital Stay (HOSPITAL_COMMUNITY): Payer: BC Managed Care – PPO

## 2013-02-13 ENCOUNTER — Inpatient Hospital Stay (HOSPITAL_COMMUNITY)
Admission: RE | Admit: 2013-02-13 | Discharge: 2013-02-15 | DRG: 756 | Disposition: A | Discharge status: Home / Self Care | Payer: BC Managed Care – PPO | Source: Ambulatory Visit | Attending: Neurosurgery | Admitting: Neurosurgery

## 2013-02-13 ENCOUNTER — Encounter (HOSPITAL_COMMUNITY): Payer: Self-pay | Admitting: *Deleted

## 2013-02-13 ENCOUNTER — Encounter (HOSPITAL_COMMUNITY)
Admission: RE | Disposition: A | Discharge status: Home / Self Care | Payer: Self-pay | Source: Ambulatory Visit | Attending: Neurosurgery

## 2013-02-13 ENCOUNTER — Encounter (HOSPITAL_COMMUNITY): Payer: Self-pay | Admitting: Vascular Surgery

## 2013-02-13 ENCOUNTER — Inpatient Hospital Stay (HOSPITAL_COMMUNITY): Payer: BC Managed Care – PPO | Admitting: Certified Registered Nurse Anesthetist

## 2013-02-13 DIAGNOSIS — K5900 Constipation, unspecified: Secondary | ICD-10-CM | POA: Diagnosis present

## 2013-02-13 DIAGNOSIS — Z79899 Other long term (current) drug therapy: Secondary | ICD-10-CM

## 2013-02-13 DIAGNOSIS — E785 Hyperlipidemia, unspecified: Secondary | ICD-10-CM | POA: Diagnosis present

## 2013-02-13 DIAGNOSIS — Z860100 Personal history of colon polyps, unspecified: Secondary | ICD-10-CM

## 2013-02-13 DIAGNOSIS — M5137 Other intervertebral disc degeneration, lumbosacral region: Secondary | ICD-10-CM | POA: Diagnosis present

## 2013-02-13 DIAGNOSIS — M4310 Spondylolisthesis, site unspecified: Secondary | ICD-10-CM | POA: Diagnosis present

## 2013-02-13 DIAGNOSIS — M51379 Other intervertebral disc degeneration, lumbosacral region without mention of lumbar back pain or lower extremity pain: Secondary | ICD-10-CM | POA: Diagnosis present

## 2013-02-13 DIAGNOSIS — R112 Nausea with vomiting, unspecified: Secondary | ICD-10-CM | POA: Diagnosis not present

## 2013-02-13 DIAGNOSIS — F329 Major depressive disorder, single episode, unspecified: Secondary | ICD-10-CM | POA: Diagnosis present

## 2013-02-13 DIAGNOSIS — K5730 Diverticulosis of large intestine without perforation or abscess without bleeding: Secondary | ICD-10-CM | POA: Diagnosis present

## 2013-02-13 DIAGNOSIS — F3289 Other specified depressive episodes: Secondary | ICD-10-CM | POA: Diagnosis present

## 2013-02-13 DIAGNOSIS — Z86711 Personal history of pulmonary embolism: Secondary | ICD-10-CM

## 2013-02-13 DIAGNOSIS — M129 Arthropathy, unspecified: Secondary | ICD-10-CM | POA: Diagnosis present

## 2013-02-13 DIAGNOSIS — M47817 Spondylosis without myelopathy or radiculopathy, lumbosacral region: Principal | ICD-10-CM | POA: Diagnosis present

## 2013-02-13 DIAGNOSIS — Z96659 Presence of unspecified artificial knee joint: Secondary | ICD-10-CM

## 2013-02-13 DIAGNOSIS — Z01812 Encounter for preprocedural laboratory examination: Secondary | ICD-10-CM

## 2013-02-13 DIAGNOSIS — K219 Gastro-esophageal reflux disease without esophagitis: Secondary | ICD-10-CM | POA: Diagnosis present

## 2013-02-13 DIAGNOSIS — Z01818 Encounter for other preprocedural examination: Secondary | ICD-10-CM

## 2013-02-13 LAB — PROTIME-INR
INR: 0.89 (ref 0.00–1.49)
Prothrombin Time: 12 seconds (ref 11.6–15.2)

## 2013-02-13 LAB — APTT: aPTT: 33 seconds (ref 24–37)

## 2013-02-13 SURGERY — POSTERIOR LUMBAR FUSION 1 LEVEL
Anesthesia: General | Site: Back | Wound class: Clean

## 2013-02-13 MED ORDER — MORPHINE SULFATE 4 MG/ML IJ SOLN: 4.0000 mg | INTRAMUSCULAR | Status: DC | PRN

## 2013-02-13 MED ORDER — LIDOCAINE-EPINEPHRINE 1 %-1:100000 IJ SOLN
INTRAMUSCULAR | Status: DC | PRN
  Administered 2013-02-13: 20 mL

## 2013-02-13 MED ORDER — BISACODYL 10 MG RE SUPP
10.0000 mg | Freq: Every day | RECTAL | Status: DC | PRN
  Administered 2013-02-15: 10 mg via RECTAL
  Filled 2013-02-13: qty 1

## 2013-02-13 MED ORDER — THROMBIN 5000 UNITS EX SOLR
OROMUCOSAL | Status: DC | PRN
  Administered 2013-02-13: 11:00:00 via TOPICAL

## 2013-02-13 MED ORDER — BUPIVACAINE HCL (PF) 0.5 % IJ SOLN
INTRAMUSCULAR | Status: DC | PRN
  Administered 2013-02-13: 30 mL

## 2013-02-13 MED ORDER — PHENOL 1.4 % MT LIQD: 1.0000 | OROMUCOSAL | Status: DC | PRN

## 2013-02-13 MED ORDER — SODIUM CHLORIDE 0.9 % IJ SOLN: 3.0000 mL | INTRAMUSCULAR | Status: DC | PRN

## 2013-02-13 MED ORDER — OXYCODONE HCL 5 MG PO TABS: 5.0000 mg | ORAL_TABLET | ORAL | Status: DC | PRN

## 2013-02-13 MED ORDER — HYDROXYZINE HCL 25 MG PO TABS: 50.0000 mg | ORAL_TABLET | ORAL | Status: DC | PRN

## 2013-02-13 MED ORDER — 0.9 % SODIUM CHLORIDE (POUR BTL) OPTIME
TOPICAL | Status: DC | PRN
  Administered 2013-02-13 (×2): 1000 mL

## 2013-02-13 MED ORDER — RIZATRIPTAN BENZOATE 10 MG PO TBDP
10.0000 mg | ORAL_TABLET | ORAL | Status: DC | PRN
Start: 2013-02-13 — End: 2013-02-15
  Filled 2013-02-13: qty 1

## 2013-02-13 MED ORDER — KETOROLAC TROMETHAMINE 30 MG/ML IJ SOLN
30.0000 mg | Freq: Once | INTRAMUSCULAR | Status: AC
  Filled 2013-02-13: qty 1

## 2013-02-13 MED ORDER — KETOROLAC TROMETHAMINE 30 MG/ML IJ SOLN
30.0000 mg | Freq: Four times a day (QID) | INTRAMUSCULAR | Status: DC
  Administered 2013-02-13 – 2013-02-15 (×7): 30 mg via INTRAVENOUS
  Filled 2013-02-13 (×7): qty 1

## 2013-02-13 MED ORDER — SODIUM CHLORIDE 0.9 % IR SOLN
Status: DC | PRN
  Administered 2013-02-13: 09:00:00

## 2013-02-13 MED ORDER — GLYCOPYRROLATE 0.2 MG/ML IJ SOLN
INTRAMUSCULAR | Status: DC | PRN
  Administered 2013-02-13: 0.6 mg via INTRAVENOUS

## 2013-02-13 MED ORDER — SODIUM CHLORIDE 0.9 % IJ SOLN
3.0000 mL | Freq: Two times a day (BID) | INTRAMUSCULAR | Status: DC
  Administered 2013-02-14 (×2): 3 mL via INTRAVENOUS

## 2013-02-13 MED ORDER — PHENYLEPHRINE HCL 10 MG/ML IJ SOLN
INTRAMUSCULAR | Status: DC | PRN
  Administered 2013-02-13: 80 ug via INTRAVENOUS

## 2013-02-13 MED ORDER — TOBRAMYCIN 0.3 % OP OINT
TOPICAL_OINTMENT | Freq: Three times a day (TID) | OPHTHALMIC | Status: DC
  Administered 2013-02-13 – 2013-02-14 (×5): via OPHTHALMIC
  Filled 2013-02-13: qty 3.5

## 2013-02-13 MED ORDER — FLUTICASONE PROPIONATE 50 MCG/ACT NA SUSP: 1.0000 | Freq: Two times a day (BID) | NASAL | Status: DC | PRN

## 2013-02-13 MED ORDER — MAGNESIUM HYDROXIDE 400 MG/5ML PO SUSP: 30.0000 mL | Freq: Every day | ORAL | Status: DC | PRN

## 2013-02-13 MED ORDER — ARTIFICIAL TEARS OP OINT
TOPICAL_OINTMENT | OPHTHALMIC | Status: DC | PRN
  Administered 2013-02-13: 1 via OPHTHALMIC

## 2013-02-13 MED ORDER — HEMOSTATIC AGENTS (NO CHARGE) OPTIME
TOPICAL | Status: DC | PRN
  Administered 2013-02-13: 1 via TOPICAL

## 2013-02-13 MED ORDER — ZOLPIDEM TARTRATE 5 MG PO TABS: 5.0000 mg | ORAL_TABLET | Freq: Every evening | ORAL | Status: DC | PRN

## 2013-02-13 MED ORDER — HYDROMORPHONE HCL PF 1 MG/ML IJ SOLN
INTRAMUSCULAR | Status: AC
  Filled 2013-02-13: qty 1

## 2013-02-13 MED ORDER — CYCLOBENZAPRINE HCL 10 MG PO TABS: 10.0000 mg | ORAL_TABLET | Freq: Three times a day (TID) | ORAL | Status: DC | PRN

## 2013-02-13 MED ORDER — ONDANSETRON HCL 4 MG/2ML IJ SOLN
4.0000 mg | Freq: Four times a day (QID) | INTRAMUSCULAR | Status: DC | PRN
  Administered 2013-02-13: 8 mg via INTRAVENOUS
  Administered 2013-02-14 – 2013-02-15 (×2): 4 mg via INTRAVENOUS
  Filled 2013-02-13 (×2): qty 2
  Filled 2013-02-13: qty 4

## 2013-02-13 MED ORDER — MENTHOL 3 MG MT LOZG: 1.0000 | LOZENGE | OROMUCOSAL | Status: DC | PRN

## 2013-02-13 MED ORDER — DEXAMETHASONE SODIUM PHOSPHATE 4 MG/ML IJ SOLN
INTRAMUSCULAR | Status: DC | PRN
  Administered 2013-02-13: 4 mg via INTRAVENOUS

## 2013-02-13 MED ORDER — PANTOPRAZOLE SODIUM 20 MG PO TBEC
20.0000 mg | DELAYED_RELEASE_TABLET | Freq: Every day | ORAL | Status: DC | PRN
  Filled 2013-02-13: qty 1

## 2013-02-13 MED ORDER — DIAZEPAM 5 MG/ML IJ SOLN
INTRAMUSCULAR | Status: AC
  Administered 2013-02-13: 2.5 mg
  Filled 2013-02-13: qty 2

## 2013-02-13 MED ORDER — MIDAZOLAM HCL 5 MG/5ML IJ SOLN
INTRAMUSCULAR | Status: DC | PRN
  Administered 2013-02-13 (×2): 2 mg via INTRAVENOUS

## 2013-02-13 MED ORDER — LACTATED RINGERS IV SOLN
INTRAVENOUS | Status: DC | PRN
  Administered 2013-02-13 (×3): via INTRAVENOUS

## 2013-02-13 MED ORDER — FENTANYL CITRATE 0.05 MG/ML IJ SOLN
INTRAMUSCULAR | Status: DC | PRN
  Administered 2013-02-13 (×5): 50 ug via INTRAVENOUS
  Administered 2013-02-13: 100 ug via INTRAVENOUS
  Administered 2013-02-13 (×2): 50 ug via INTRAVENOUS
  Administered 2013-02-13: 150 ug via INTRAVENOUS

## 2013-02-13 MED ORDER — PROPOFOL 10 MG/ML IV BOLUS
INTRAVENOUS | Status: DC | PRN
  Administered 2013-02-13: 200 mg via INTRAVENOUS

## 2013-02-13 MED ORDER — THROMBIN 20000 UNITS EX KIT
PACK | CUTANEOUS | Status: DC | PRN
  Administered 2013-02-13: 20000 [IU] via TOPICAL

## 2013-02-13 MED ORDER — ACETAMINOPHEN 325 MG PO TABS: 650.0000 mg | ORAL_TABLET | ORAL | Status: DC | PRN

## 2013-02-13 MED ORDER — ALUM & MAG HYDROXIDE-SIMETH 200-200-20 MG/5ML PO SUSP: 30.0000 mL | Freq: Four times a day (QID) | ORAL | Status: DC | PRN

## 2013-02-13 MED ORDER — ROCURONIUM BROMIDE 100 MG/10ML IV SOLN
INTRAVENOUS | Status: DC | PRN
  Administered 2013-02-13 (×3): 10 mg via INTRAVENOUS
  Administered 2013-02-13: 50 mg via INTRAVENOUS
  Administered 2013-02-13 (×3): 10 mg via INTRAVENOUS

## 2013-02-13 MED ORDER — NEOSTIGMINE METHYLSULFATE 1 MG/ML IJ SOLN
INTRAMUSCULAR | Status: DC | PRN
  Administered 2013-02-13: 4 mg via INTRAVENOUS

## 2013-02-13 MED ORDER — KCL IN DEXTROSE-NACL 20-5-0.45 MEQ/L-%-% IV SOLN
INTRAVENOUS | Status: DC
  Administered 2013-02-13 – 2013-02-14 (×2): via INTRAVENOUS
  Filled 2013-02-13 (×7): qty 1000

## 2013-02-13 MED ORDER — CEFAZOLIN SODIUM-DEXTROSE 2-3 GM-% IV SOLR
INTRAVENOUS | Status: AC
  Filled 2013-02-13: qty 50

## 2013-02-13 MED ORDER — KETOROLAC TROMETHAMINE 0.5 % OP SOLN
1.0000 [drp] | Freq: Four times a day (QID) | OPHTHALMIC | Status: DC | PRN
  Administered 2013-02-13: 1 [drp] via OPHTHALMIC
  Filled 2013-02-13: qty 3

## 2013-02-13 MED ORDER — BACITRACIN 50000 UNITS IM SOLR
INTRAMUSCULAR | Status: AC
  Filled 2013-02-13: qty 1

## 2013-02-13 MED ORDER — LIDOCAINE HCL (CARDIAC) 20 MG/ML IV SOLN
INTRAVENOUS | Status: DC | PRN
  Administered 2013-02-13: 70 mg via INTRAVENOUS

## 2013-02-13 MED ORDER — PROPOFOL INFUSION 10 MG/ML OPTIME
INTRAVENOUS | Status: DC | PRN
  Administered 2013-02-13: 140 ug/kg/min via INTRAVENOUS

## 2013-02-13 MED ORDER — SODIUM CHLORIDE 0.9 % IV SOLN
INTRAVENOUS | Status: AC
  Filled 2013-02-13: qty 500

## 2013-02-13 MED ORDER — ACETAMINOPHEN 10 MG/ML IV SOLN
INTRAVENOUS | Status: AC
  Administered 2013-02-13: 1000 mg via INTRAVENOUS
  Filled 2013-02-13: qty 100

## 2013-02-13 MED ORDER — LIDOCAINE HCL 4 % MT SOLN
OROMUCOSAL | Status: DC | PRN
  Administered 2013-02-13: 4 mL via TOPICAL

## 2013-02-13 MED ORDER — HYDROMORPHONE HCL PF 1 MG/ML IJ SOLN
0.2500 mg | INTRAMUSCULAR | Status: DC | PRN
  Administered 2013-02-13 (×4): 0.5 mg via INTRAVENOUS

## 2013-02-13 MED ORDER — ACETAMINOPHEN 650 MG RE SUPP: 650.0000 mg | RECTAL | Status: DC | PRN

## 2013-02-13 MED ORDER — ACETAMINOPHEN 10 MG/ML IV SOLN
1000.0000 mg | Freq: Four times a day (QID) | INTRAVENOUS | Status: AC
  Administered 2013-02-13 – 2013-02-14 (×4): 1000 mg via INTRAVENOUS
  Filled 2013-02-13 (×3): qty 100

## 2013-02-13 MED ORDER — ONDANSETRON HCL 4 MG/2ML IJ SOLN
INTRAMUSCULAR | Status: DC | PRN
  Administered 2013-02-13: 4 mg via INTRAVENOUS

## 2013-02-13 SURGICAL SUPPLY — 73 items
ADH SKN CLS APL DERMABOND .7 (GAUZE/BANDAGES/DRESSINGS) ×2
ADH SKN CLS LQ APL DERMABOND (GAUZE/BANDAGES/DRESSINGS) ×1
BAG DECANTER FOR FLEXI CONT (MISCELLANEOUS) ×2 IMPLANT
BLADE SURG ROTATE 9660 (MISCELLANEOUS) IMPLANT
BRUSH SCRUB EZ PLAIN DRY (MISCELLANEOUS) ×2 IMPLANT
BUR ACRON 5.0MM COATED (BURR) ×2 IMPLANT
BUR MATCHSTICK NEURO 3.0 LAGG (BURR) ×2 IMPLANT
CANISTER SUCTION 2500CC (MISCELLANEOUS) ×2 IMPLANT
CAP LCK SPNE (Orthopedic Implant) ×4 IMPLANT
CAP LOCK SPINE RADIUS (Orthopedic Implant) IMPLANT
CAP LOCKING (Orthopedic Implant) ×8 IMPLANT
CLOTH BEACON ORANGE TIMEOUT ST (SAFETY) ×2 IMPLANT
CONT SPEC 4OZ CLIKSEAL STRL BL (MISCELLANEOUS) ×2 IMPLANT
COVER BACK TABLE 24X17X13 BIG (DRAPES) IMPLANT
COVER TABLE BACK 60X90 (DRAPES) ×2 IMPLANT
DERMABOND ADHESIVE PROPEN (GAUZE/BANDAGES/DRESSINGS) ×1
DERMABOND ADVANCED (GAUZE/BANDAGES/DRESSINGS) ×2
DERMABOND ADVANCED .7 DNX12 (GAUZE/BANDAGES/DRESSINGS) ×2 IMPLANT
DERMABOND ADVANCED .7 DNX6 (GAUZE/BANDAGES/DRESSINGS) IMPLANT
DRAPE C-ARM 42X72 X-RAY (DRAPES) ×4 IMPLANT
DRAPE LAPAROTOMY 100X72X124 (DRAPES) ×2 IMPLANT
DRAPE POUCH INSTRU U-SHP 10X18 (DRAPES) ×2 IMPLANT
DRAPE PROXIMA HALF (DRAPES) ×2 IMPLANT
DRSG EMULSION OIL 3X3 NADH (GAUZE/BANDAGES/DRESSINGS) IMPLANT
ELECT REM PT RETURN 9FT ADLT (ELECTROSURGICAL) ×2
ELECTRODE REM PT RTRN 9FT ADLT (ELECTROSURGICAL) ×1 IMPLANT
GAUZE SPONGE 4X4 16PLY XRAY LF (GAUZE/BANDAGES/DRESSINGS) IMPLANT
GLOVE BIOGEL PI IND STRL 8 (GLOVE) ×2 IMPLANT
GLOVE BIOGEL PI INDICATOR 8 (GLOVE) ×5
GLOVE ECLIPSE 7.5 STRL STRAW (GLOVE) ×6 IMPLANT
GLOVE EXAM NITRILE LRG STRL (GLOVE) IMPLANT
GLOVE EXAM NITRILE MD LF STRL (GLOVE) IMPLANT
GLOVE EXAM NITRILE XL STR (GLOVE) IMPLANT
GLOVE EXAM NITRILE XS STR PU (GLOVE) IMPLANT
GLOVE INDICATOR 8.5 STRL (GLOVE) ×3 IMPLANT
GOWN BRE IMP SLV AUR LG STRL (GOWN DISPOSABLE) ×1 IMPLANT
GOWN BRE IMP SLV AUR XL STRL (GOWN DISPOSABLE) ×3 IMPLANT
GOWN STRL REIN 2XL LVL4 (GOWN DISPOSABLE) ×3 IMPLANT
KIT BASIN OR (CUSTOM PROCEDURE TRAY) ×2 IMPLANT
KIT INFUSE SMALL (Orthopedic Implant) ×1 IMPLANT
KIT ROOM TURNOVER OR (KITS) ×2 IMPLANT
MILL MEDIUM DISP (BLADE) ×2 IMPLANT
NDL SPNL 18GX3.5 QUINCKE PK (NEEDLE) IMPLANT
NDL SPNL 22GX3.5 QUINCKE BK (NEEDLE) ×2 IMPLANT
NEEDLE BONE MARROW 8GAX6 (NEEDLE) ×1 IMPLANT
NEEDLE SPNL 18GX3.5 QUINCKE PK (NEEDLE) IMPLANT
NEEDLE SPNL 22GX3.5 QUINCKE BK (NEEDLE) ×4 IMPLANT
NS IRRIG 1000ML POUR BTL (IV SOLUTION) ×2 IMPLANT
PACK LAMINECTOMY NEURO (CUSTOM PROCEDURE TRAY) ×2 IMPLANT
PAD ARMBOARD 7.5X6 YLW CONV (MISCELLANEOUS) ×6 IMPLANT
PATTIES SURGICAL .5 X.5 (GAUZE/BANDAGES/DRESSINGS) ×1 IMPLANT
PATTIES SURGICAL .5 X1 (DISPOSABLE) IMPLANT
PATTIES SURGICAL 1X1 (DISPOSABLE) IMPLANT
PEEK PLIF AVS 10X25X4 (Peek) ×2 IMPLANT
ROD 5.5X30MM (Rod) ×2 IMPLANT
SCREW 5.75 X 635 (Screw) ×2 IMPLANT
SCREW 5.75X40M (Screw) ×2 IMPLANT
SPONGE GAUZE 4X4 12PLY (GAUZE/BANDAGES/DRESSINGS) ×2 IMPLANT
SPONGE LAP 4X18 X RAY DECT (DISPOSABLE) IMPLANT
SPONGE NEURO XRAY DETECT 1X3 (DISPOSABLE) IMPLANT
SPONGE SURGIFOAM ABS GEL 100 (HEMOSTASIS) ×2 IMPLANT
STRIP BIOACTIVE VITOSS 25X52X4 (Orthopedic Implant) ×2 IMPLANT
STRIP VITOSS 25X50X4MM (Neuro Prosthesis/Implant) ×1 IMPLANT
SUT VIC AB 1 CT1 18XBRD ANBCTR (SUTURE) ×2 IMPLANT
SUT VIC AB 1 CT1 8-18 (SUTURE) ×4
SUT VIC AB 2-0 CP2 18 (SUTURE) ×4 IMPLANT
SYR 20ML ECCENTRIC (SYRINGE) ×2 IMPLANT
SYR CONTROL 10ML LL (SYRINGE) ×2 IMPLANT
TAPE CLOTH SURG 4X10 WHT LF (GAUZE/BANDAGES/DRESSINGS) ×1 IMPLANT
TOWEL OR 17X24 6PK STRL BLUE (TOWEL DISPOSABLE) ×2 IMPLANT
TOWEL OR 17X26 10 PK STRL BLUE (TOWEL DISPOSABLE) ×2 IMPLANT
TRAY FOLEY CATH 14FRSI W/METER (CATHETERS) ×2 IMPLANT
WATER STERILE IRR 1000ML POUR (IV SOLUTION) ×2 IMPLANT

## 2013-02-13 NOTE — H&P (Signed)
Subjective: Patient is a 67 y.o. female who is admitted for treatment of advanced degenerative changes at the L4-5 level.  Patient has had progressively increasing difficulties with low back and right lumbar radicular pain. She's had progressive degenerative changes develop at the L4-5 level, with translation of L4 to the left relative to L5, which causes particular right-sided canal and lateral recess stenosis. This corresponds to her right lumbar radicular pain. Further flexion extension imaging shows a dynamic spondylolisthesis at L4-5. She's been treated extensively with NSAIDS, ESIs, and physical therapy without relief. She is admitted now for a bilateral L4-5 lumbar decompression, including laminectomy, facetectomy, and foraminotomy, and a bilateral L4-5 posterior lumbar interbody arthrodesis, and bilateral posterolateral arthrodesis, with interbody implants, posterior instrumentation patient, and bone graft.   Patient Active Problem List   Diagnosis Date Noted  . NONSPEC ELEVATION OF LEVELS OF TRANSAMINASE/LDH 10/18/2010  . DIVERTICULITIS-COLON 09/28/2010  . DEGENERATIVE JOINT DISEASE, LEFT KNEE 09/15/2010  . PULMONARY EMBOLISM, HX OF 09/15/2010  . RHINITIS 05/14/2008  . COLONIC POLYPS 01/22/2008  . ESOPHAGITIS 01/22/2008  . DIVERTICULAR DISEASE 01/22/2008  . CONSTIPATION, CHRONIC 01/22/2008  . DEPRESSION, HX OF 01/22/2008  . GASTROESOPHAGEAL REFLUX DISEASE, CHRONIC 08/13/2007  . HYPERLIPIDEMIA 01/06/2007   Past Medical History  Diagnosis Date  . Benign neoplasm of colon   . Esophagitis, unspecified   . Other and unspecified hyperlipidemia   . Other constipation   . Diverticulosis of colon (without mention of hemorrhage)   . Personal history of other mental disorder   . Other and unspecified hyperlipidemia   . Pulmonary embolism     2006  . Complication of anesthesia     anectine - caused her to have difficulty waking up and was on vent for few hours post op  . PONV  (postoperative nausea and vomiting)   . Palpitations   . Esophageal reflux     few times per month not routine  . Headache   . Arthritis     Past Surgical History  Procedure Laterality Date  . Partial hysterectomy    . Appendectomy    . Tubal ligation    . Lumbar laminectomy    . Abdominoplasty    . Total knee arthroplasty    . Abdominal hysterectomy    . Joint replacement      knee replacement  . Liposuction    . Cardiac catheterization      09/06/10: normal coronaries and LV function (saw Dr. Alanda Amass in 2011)    Prescriptions prior to admission  Medication Sig Dispense Refill  . pantoprazole (PROTONIX) 20 MG tablet Take 20 mg by mouth daily as needed. Acid reflux.      . enoxaparin (LOVENOX) 40 MG/0.4ML injection Inject 40 mg into the skin daily.      . fluticasone (FLONASE) 50 MCG/ACT nasal spray Place 1 spray into the nose 2 (two) times daily as needed for rhinitis.  16 g  2  . ibuprofen (ADVIL,MOTRIN) 200 MG tablet Take 800 mg by mouth every 6 (six) hours as needed for pain.      . rizatriptan (MAXALT-MLT) 10 MG disintegrating tablet Take 10 mg by mouth as needed for migraine. May repeat in 2 hours if needed       Allergies  Allergen Reactions  . Succinylcholine     Respiratory suppression     History  Substance Use Topics  . Smoking status: Never Smoker   . Smokeless tobacco: Never Used  . Alcohol Use: Yes  Comment: socially    Family History  Problem Relation Age of Onset  . Tics Father   . Lung cancer Father   . Breast cancer Mother      Review of Systems A comprehensive review of systems was negative.  Objective: Vital signs in last 24 hours: Temp:  [97.4 F (36.3 C)] 97.4 F (36.3 C) (03/27 0611) Pulse Rate:  [78] 78 (03/27 0611) Resp:  [20] 20 (03/27 0611) BP: (104)/(65) 104/65 mmHg (03/27 0611) SpO2:  [94 %] 94 % (03/27 0611)  EXAM: Patient is a well-developed well-nourished white female in no acute distress. Lungs are clear to  auscultation, the patient has symmetrical respiratory excursion. Heart has a regular rate and rhythm normal S1 and S2 no murmur.   Abdomen is soft nontender nondistended bowel sounds are present. Extremity examination shows no clubbing cyanosis or edema. Musculoskeletal examination shows no tenderness to palpation over the lumbar spinous processes or paralumbar musculature. She is able to flex beyond 90, and able to 10. Straight leg raising is negative bilaterally. Motor examination shows 5 over 5 strength in the lower extremities including the iliopsoas quadriceps dorsiflexor extensor hallicus  longus and plantar flexor bilaterally. Sensation is intact to pinprick in the distal lower extremities. Reflexes are symmetrical bilaterally. No pathologic reflexes are present. Patient has a normal gait and stance.   Data Review:CBC    Component Value Date/Time   WBC 4.8 02/05/2013 1041   WBC 3.9 08/31/2010 0945   RBC 4.52 02/05/2013 1041   RBC 4.40 08/31/2010 0945   HGB 13.5 02/05/2013 1041   HGB 12.9 08/31/2010 0945   HCT 40.0 02/05/2013 1041   HCT 38.6 08/31/2010 0945   PLT 222 02/05/2013 1041   PLT 227 08/31/2010 0945   MCV 88.5 02/05/2013 1041   MCV 87.7 08/31/2010 0945   MCH 29.9 02/05/2013 1041   MCHC 33.8 02/05/2013 1041   MCHC 33.4 08/31/2010 0945   RDW 13.8 02/05/2013 1041   RDW 14.1 08/31/2010 0945   LYMPHSABS 2.1 07/12/2012 1540   LYMPHSABS 1.4 08/31/2010 0945   MONOABS 1.0 07/12/2012 1540   MONOABS 0.5 08/31/2010 0945   EOSABS 0.0 07/12/2012 1540   EOSABS 0.1 08/31/2010 0945   BASOSABS 0.0 07/12/2012 1540   BASOSABS 0.0 08/31/2010 0945                          BMET    Component Value Date/Time   NA 138 02/05/2013 1041   K 4.1 02/05/2013 1041   CL 104 02/05/2013 1041   CO2 27 02/05/2013 1041   GLUCOSE 96 02/05/2013 1041   BUN 22 02/05/2013 1041   CREATININE 0.76 02/05/2013 1041   CALCIUM 9.5 02/05/2013 1041   GFRNONAA 87* 02/05/2013 1041   GFRAA >90 02/05/2013 1041      Assessment/Plan: Patient with low back and right lumbar radicular pain, has been treated for several years with nonsurgical measures, which has become increasingly disabling and disrupted this patient's life, who is now admitted for an L4-5 lumbar decompression and arthrodesis.  I've discussed with the patient the nature of his condition, the nature the surgical procedure, the typical length of surgery, hospital stay, and overall recuperation, the limitations postoperatively, and risks of surgery. I discussed risks including risks of infection, bleeding, possibly need for transfusion, the risk of nerve root dysfunction with pain, weakness, numbness, or paresthesias, the risk of dural tear and CSF leakage and possible need for further surgery, the risk  of failure of the arthrodesis and possibly for further surgery, the risk of anesthetic complications including myocardial infarction, stroke, pneumonia, and death. We discussed the need for postoperative immobilization in a lumbar brace. Understanding all this the patient does wish to proceed with surgery and is admitted for such.     Hewitt Shorts, MD 02/13/2013 6:45 AM

## 2013-02-13 NOTE — Plan of Care (Signed)
Problem: Consults Goal: Diagnosis - Spinal Surgery Outcome: Completed/Met Date Met:  02/13/13 Thoraco/Lumbar Spine Fusion

## 2013-02-13 NOTE — Anesthesia Procedure Notes (Signed)
Procedure Name: Intubation Date/Time: 02/13/2013 8:01 AM Performed by: Orvilla Fus A Pre-anesthesia Checklist: Patient identified, Timeout performed, Emergency Drugs available, Suction available and Patient being monitored Patient Re-evaluated:Patient Re-evaluated prior to inductionOxygen Delivery Method: Circle system utilized Preoxygenation: Pre-oxygenation with 100% oxygen Intubation Type: IV induction Ventilation: Mask ventilation without difficulty Laryngoscope Size: Mac and 3 Grade View: Grade I Tube type: Oral Tube size: 7.0 mm Number of attempts: 1 Airway Equipment and Method: Stylet and LTA kit utilized Placement Confirmation: ETT inserted through vocal cords under direct vision,  breath sounds checked- equal and bilateral and positive ETCO2 Secured at: 22 cm Tube secured with: Tape Dental Injury: Teeth and Oropharynx as per pre-operative assessment

## 2013-02-13 NOTE — Op Note (Signed)
02/13/2013  11:43 AM  PATIENT:  Lisa Moody  67 y.o. 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, lumbar stenosis, lumbar degenerative disc disease, lumbar spondylosis  POST-OPERATIVE DIAGNOSIS:  L4-5 dynamic degenerative grade 1 lumbar spondylolisthesis, lumbar stenosis, lumbar degenerative disc disease, lumbar spondylosis  PROCEDURE:  Procedure(s): POSTERIOR LUMBAR FUSION 1 LEVEL: Bilateral L4-5 lumbar laminectomy, facetectomy, and foraminotomy with decompression of the stenotic spinal canal and the stenotic compression of the exiting L4 and L5 nerve roots bilaterally, with decompression beyond that required for interbody arthrodesis, with microdissection and microsurgical technique, bilateral L4-5 posterior lumbar interbody arthrodesis with AVS peek interbody implants, Vitoss BA, and infuse, and bilateral L4-5 posterolateral nonsegmental arthrodesis with radius posterior instrumentation, Vitoss BA, infuse, and locally harvested morcellized autograft  SURGEON:  Surgeon(s): Hewitt Shorts, MD Reinaldo Meeker, MD  ASSISTANTS: Aliene Beams, M.D.  ANESTHESIA:   general  EBL:  Total I/O In: 2500 [I.V.:2500] Out: 735 [Urine:535; Blood:200]  BLOOD ADMINISTERED:none  COUNT: Correct per nursing staff  DICTATION: Patient is brought to the operating room placed under general endotracheal anesthesia. The patient was turned to prone position the lumbar region was prepped with Betadine soap and solution and draped in a sterile fashion. The midline was infiltrated with local anesthesia with epinephrine. A localizing x-ray was taken and then a midline incision was made carried down through the subcutaneous tissue, bipolar cautery and electrocautery were used to maintain hemostasis. Dissection was carried down to the lumbar fascia. The fascia was incised bilaterally and the paraspinal muscles were dissected with a spinous process and lamina in a  subperiosteal fashion. Another x-ray was taken for localization and the L4-5 level was localized. Dissection was then carried out laterally over the facet complex, the facet complexes were hypertrophic, and we began the facetectomy and the transverse processes of L4 and L5 were exposed and decorticated. We proceeded with the decompression using microdissection microsurgical technique. Laminectomies were performed using double-action rongeurs, the high-speed drill, and Kerrison punches. Care was taken to carefully decompress the significant canal stenosis. Dissection was carried out laterally including facetectomy and foraminotomies with decompression of the stenotic compression of the  L4 and L5 nerve roots bilaterally . Once the decompression stenotic compression of the thecal sac and exiting nerve roots was completed we proceeded with the posterior lumbar interbody arthrodesis. The annulus was incised bilaterally and the disc space entered. A thorough discectomy was performed using pituitary rongeurs and curettes. Once the discectomy was completed we began to prepare the endplate surfaces removing the cartilaginous endplates surface. We then measured the height of the intervertebral disc space. We selected  10 x 25 x 4 AVS peek interbody implants.  The C-arm fluoroscope was then draped and brought in the field and we identified the pedicle entry points bilaterally at the L4 and L5 levels. Each of the 4 pedicles was probed, we aspirated bone marrow aspirate from the vertebral bodies, this was injected over a strips of Vitoss BA. Then each of the pedicles was examined with the ball probe good bony surfaces were found and no bony cuts were found. Each of the pedicles was then tapped with a 5.25 mm tap, again examined with the ball probe good threading was found and no bony cuts were found. We then placed 5.75 by 35 mm screws bilaterally at  the L4  level, and 5.75 x 40 mm screws bilaterally at the L5 level.  We  then packed the AVS peek interbody implants with Vitoss  BA with bone marrow aspirate and infuse , and then placed the first implant and on the right side, carefully retracting the thecal sac and nerve root medially. We then went back to the left side and packed the midline with additional Vitoss BA  with bone marrow aspirate and infuse and then placed a second implant and on the left side again retracting the thecal sac and nerve root medially.   We then packed the lateral gutter over the transverse processes and intertransverse space with Vitoss BA with bone marrow aspirate, and infuse, and locally harvested morcellized autograft. We then selected  a 35 mm pre-lordosed rods, they were placed within the screw heads and secured with locking caps once all 4 locking caps were placed final tightening was performed against a counter torque.  The wound had been irrigated multiple times during the procedure with saline solution and bacitracin solution, good hemostasis was established with a combination of bipolar cautery and Gelfoam with thrombin. Once good hemostasis was confirmed we proceeded with closure paraspinal muscles deep fascia and Scarpa's fascia were closed with interrupted undyed 1 Vicryl sutures the subcutaneous and subcuticular closed with interrupted inverted 2-0 undyed Vicryl sutures the skin edges were approximated with  Dermabond.  The wound was dressed with sterile gauze and Hypafix  Following surgery the patient was turned back to the supine position to be reversed and the anesthetic extubated and transferred to the recovery room for further care.   PLAN OF CARE: Admit to inpatient   PATIENT DISPOSITION:  PACU - hemodynamically stable.   Delay start of Pharmacological VTE agent (>24hrs) due to surgical blood loss or risk of bleeding:  yes

## 2013-02-13 NOTE — Transfer of Care (Signed)
Immediate Anesthesia Transfer of Care Note  Patient: Lisa Moody  Procedure(s) Performed: Procedure(s) with comments: POSTERIOR LUMBAR FUSION 1 LEVEL (N/A) - Lumbar four - five  decompression with posterior lumbar interbody fusion with interbody prothesis posterolateral arthrodesis and posterior nonsegmental instrumentation  Patient Location: PACU  Anesthesia Type:General  Level of Consciousness: awake, alert , oriented and patient cooperative  Airway & Oxygen Therapy: Patient Spontanous Breathing and Patient connected to nasal cannula oxygen  Post-op Assessment: Report given to PACU RN, Post -op Vital signs reviewed and stable and Patient moving all extremities  Post vital signs: Reviewed and stable  Complications: No apparent anesthesia complications

## 2013-02-13 NOTE — Preoperative (Signed)
Beta Blockers   Reason not to administer Beta Blockers:Not Applicable 

## 2013-02-13 NOTE — Progress Notes (Signed)
Dr. Randa Evens at bedside, no new order given for R eye discomfort. Will continue to monitor. Ok to go to room.

## 2013-02-13 NOTE — Progress Notes (Signed)
Filed Vitals:   02/13/13 1334 02/13/13 1415 02/13/13 1442 02/13/13 1639  BP: 121/71 123/92 142/87 127/80  Pulse: 101  115 101  Temp:  98.8 F (37.1 C) 98.5 F (36.9 C) 97.5 F (36.4 C)  TempSrc:      Resp: 14  18 16   SpO2: 94%  94% 95%     Patient's been up and ambulating the halls twice. Has had mild low-grade nausea, but just vomited a large volume. We'll give Zofran IV, and continue IV fluids.  Patient reports that she's fairly comfortable. Foley was discontinued after second ambulation in hall.  Plan: Continue progress through postoperative recovery, have encouraged ambulation. Treat nausea and vomiting as needed.  Hewitt Shorts, MD 02/13/2013, 7:15 PM

## 2013-02-13 NOTE — Anesthesia Postprocedure Evaluation (Signed)
  Anesthesia Post-op Note  Patient: Lisa Moody  Procedure(s) Performed: Procedure(s) with comments: POSTERIOR LUMBAR FUSION 1 LEVEL (N/A) - Lumbar four - five  decompression with posterior lumbar interbody fusion with interbody prothesis posterolateral arthrodesis and posterior nonsegmental instrumentation  Patient Location: PACU  Anesthesia Type:General  Level of Consciousness: awake, alert , oriented and patient cooperative  Airway and Oxygen Therapy: Patient Spontanous Breathing and Patient connected to nasal cannula oxygen  Post-op Pain: mild  Post-op Assessment: Post-op Vital signs reviewed and Patient's Cardiovascular Status Stable  Post-op Vital Signs: Reviewed and stable  Complications: No apparent anesthesia complications and Pt c/o Right Eye discomfort  Cool compress applied Nothing apparent at this time  R Daurice Ovando CRNA

## 2013-02-13 NOTE — Progress Notes (Signed)
Filed Vitals:   02/13/13 1251 02/13/13 1334 02/13/13 1415 02/13/13 1442  BP: 126/79 121/71 123/92 142/87  Pulse: 98 101  115  Temp:   98.8 F (37.1 C) 98.5 F (36.9 C)  TempSrc:      Resp: 12 14  18   SpO2: 96% 94%  94%     Patient complaining of discomfort in right eye, suspect mild corneal abrasion. Has already ambulated in the halls.  Dressing clean and dry.  Plan: We'll plan on discontinuing Foley after second ambulation. Will order ophthalmologic drops and ointment for abrasion. Encouraged to ambulate.  Hewitt Shorts, MD 02/13/2013, 3:49 PM

## 2013-02-14 NOTE — Progress Notes (Signed)
Filed Vitals:   02/13/13 1945 02/14/13 0000 02/14/13 0359 02/14/13 0812  BP: 136/88 111/67 115/76 117/74  Pulse: 98 86 89 86  Temp: 97.3 F (36.3 C) 99.1 F (37.3 C) 98.5 F (36.9 C) 97.7 F (36.5 C)  TempSrc: Oral Oral Oral   Resp: 16 16 16 18   SpO2: 94% 95% 96% 94%    Patient resting in bed, comfortable. Has been up and ambulating. Dressing clean and dry. Voiding well. No further nausea or vomiting. Ate breakfast this morning. Making good progress.  Plan: We'll continue progress to postoperative recovery.  Hewitt Shorts, MD 02/14/2013, 8:44 AM

## 2013-02-15 LAB — URINALYSIS, ROUTINE W REFLEX MICROSCOPIC
Glucose, UA: NEGATIVE mg/dL
Hgb urine dipstick: NEGATIVE
Ketones, ur: 15 mg/dL — AB
Nitrite: POSITIVE — AB
Protein, ur: NEGATIVE mg/dL
Specific Gravity, Urine: 1.02 (ref 1.005–1.030)
Urobilinogen, UA: 1 mg/dL (ref 0.0–1.0)
pH: 6 (ref 5.0–8.0)

## 2013-02-15 LAB — URINE MICROSCOPIC-ADD ON: Bacteria, UA: NONE SEEN

## 2013-02-15 MED ORDER — HYDROCODONE-ACETAMINOPHEN 5-325 MG PO TABS: 1.0000 | ORAL_TABLET | ORAL | Status: DC | PRN

## 2013-02-15 NOTE — Discharge Summary (Signed)
Physician Discharge Summary  Patient ID: Lisa Moody MRN: [id number] DOB/AGE: 01/22/1946 67 y.o.  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 Postoperatively she's been able to mobilize actively in the halls. She did have some difficulties with nausea and vomiting initially, and has not yet had a bowel movement. She's had intermittent difficulties with hematuria. The Foley catheter when she arrived in the floor had blood in the tubing, but since the cath was then removed the urine at times been cleared, but at other times has been bloody. We obtaining a clean catch urine for urinalysis and urine culture. She's also going to be given a Ducalax suppository to help with the constipation. She is up and ambulate actively in the halls, her wound is healing well. She is asking to be discharged home. She's been given instructions regarding wound care and activities. We will contact her about the results of her urinalysis and urine culture, and will prescribe an antibiotic if a UTI is found. She is to return for followup with me in the office in 3 weeks. I've recommended urology evaluation of hematuria, which she and her husband will arrange as an outpatient with Dr. Vernie Ammons.  Discharge Exam: Blood pressure 115/71, pulse 95, temperature 99.6 F (37.6 C), temperature source Oral, resp. rate 18, SpO2 95.00%.  Disposition:  home   Future Appointments Provider Department Dept Phone   04/21/2013 3:15 PM Alison Murray, MD [facility] [phone]        Medication List    TAKE these medications       enoxaparin 40 MG/0.4ML injection  Commonly known as:  LOVENOX  Inject 40 mg into the skin daily.     fluticasone 50 MCG/ACT nasal spray  Commonly known as:  FLONASE  Place 1 spray into the nose 2 (two) times daily as needed for rhinitis.     HYDROcodone-acetaminophen 5-325 MG per tablet  Commonly known as:  NORCO/VICODIN  Take 1-2 tablets by mouth every 4 (four) hours as needed for pain.     ibuprofen 200 MG tablet  Commonly known as:  ADVIL,MOTRIN  Take 800 mg by mouth every 6 (six) hours as needed for pain.     PROTONIX 20 MG tablet  Generic drug:  pantoprazole  Take 20 mg by mouth daily as needed. Acid reflux.     rizatriptan 10 MG disintegrating tablet  Commonly known as:  MAXALT-MLT  Take 10 mg by mouth as needed for migraine. May repeat in 2 hours if needed         Signed: Hewitt Shorts, MD 02/15/2013, 8:06 AM

## 2013-02-15 NOTE — Progress Notes (Signed)
Pt. Alert and oriented,follows simple instructions, denies pain. Incision area without swelling, redness or S/S of infection. Voiding adequate clear yellow urine. Moving all extremities well and vitals stable and documented. Lumbar surgery notes instructions given to patient and family member for home safety and precautions. Pt. and family stated understanding of instructions given.  

## 2013-02-16 LAB — URINE CULTURE: Colony Count: 15000

## 2013-02-17 MED FILL — Sodium Chloride IV Soln 0.9%: INTRAVENOUS | Qty: 1000 | Status: AC

## 2013-02-17 MED FILL — Heparin Sodium (Porcine) Inj 1000 Unit/ML: INTRAMUSCULAR | Qty: 30 | Status: AC

## 2013-02-20 ENCOUNTER — Encounter: Payer: Self-pay | Admitting: *Deleted

## 2013-04-17 ENCOUNTER — Encounter: Payer: Self-pay | Admitting: Obstetrics and Gynecology

## 2013-04-21 ENCOUNTER — Ambulatory Visit: Payer: Self-pay | Admitting: Obstetrics and Gynecology

## 2013-04-21 ENCOUNTER — Encounter: Payer: Self-pay | Admitting: Obstetrics and Gynecology

## 2013-04-21 DIAGNOSIS — Z01419 Encounter for gynecological examination (general) (routine) without abnormal findings: Secondary | ICD-10-CM

## 2013-04-22 ENCOUNTER — Other Ambulatory Visit: Payer: Self-pay

## 2013-05-05 ENCOUNTER — Other Ambulatory Visit: Payer: Self-pay | Admitting: Dermatology

## 2013-05-18 ENCOUNTER — Other Ambulatory Visit: Payer: Self-pay | Admitting: Internal Medicine

## 2013-05-18 DIAGNOSIS — E785 Hyperlipidemia, unspecified: Secondary | ICD-10-CM

## 2013-05-20 ENCOUNTER — Other Ambulatory Visit: Payer: Self-pay | Admitting: Internal Medicine

## 2013-05-20 ENCOUNTER — Other Ambulatory Visit: Payer: BC Managed Care – PPO

## 2013-05-20 DIAGNOSIS — E785 Hyperlipidemia, unspecified: Secondary | ICD-10-CM

## 2013-05-21 LAB — NMR LIPOPROFILE WITH LIPIDS
Cholesterol, Total: 240 mg/dL — ABNORMAL HIGH (ref <200)
HDL Particle Number: 39 umol/L (ref >=30.5)
LDL Particle Number: 2081 nmol/L — ABNORMAL HIGH (ref <1000)
Large VLDL-P: 2.6 nmol/L (ref <=2.7)
Triglycerides: 95 mg/dL (ref <150)
VLDL Size: 44.9 nm (ref <=46.6)

## 2013-07-14 ENCOUNTER — Encounter: Payer: Self-pay | Admitting: Internal Medicine

## 2013-08-18 ENCOUNTER — Encounter: Payer: Self-pay | Admitting: Internal Medicine

## 2013-09-01 ENCOUNTER — Other Ambulatory Visit: Payer: Self-pay | Admitting: Dermatology

## 2013-09-03 ENCOUNTER — Encounter: Payer: Self-pay | Admitting: Internal Medicine

## 2013-09-03 DIAGNOSIS — C4490 Unspecified malignant neoplasm of skin, unspecified: Secondary | ICD-10-CM | POA: Insufficient documentation

## 2013-09-25 ENCOUNTER — Other Ambulatory Visit: Payer: Self-pay

## 2013-10-23 ENCOUNTER — Encounter: Payer: BC Managed Care – PPO | Admitting: Internal Medicine

## 2013-11-10 ENCOUNTER — Ambulatory Visit (INDEPENDENT_AMBULATORY_CARE_PROVIDER_SITE_OTHER): Payer: BC Managed Care – PPO | Admitting: Obstetrics and Gynecology

## 2013-11-10 ENCOUNTER — Encounter: Payer: Self-pay | Admitting: Obstetrics and Gynecology

## 2013-11-10 VITALS — BP 110/68 | HR 70 | Resp 16 | Ht 62.0 in | Wt 163.0 lb

## 2013-11-10 DIAGNOSIS — Z01419 Encounter for gynecological examination (general) (routine) without abnormal findings: Secondary | ICD-10-CM

## 2013-11-10 DIAGNOSIS — Z23 Encounter for immunization: Secondary | ICD-10-CM

## 2013-11-10 DIAGNOSIS — Z0000 Encounter for general adult medical examination without abnormal findings: Secondary | ICD-10-CM

## 2013-11-10 LAB — POCT URINALYSIS DIPSTICK
Bilirubin, UA: NEGATIVE
Glucose, UA: NEGATIVE
Nitrite, UA: NEGATIVE
Urobilinogen, UA: NEGATIVE

## 2013-11-10 NOTE — Progress Notes (Signed)
Patient ID: Lisa Moody, female   DOB: [birth year], 67 y.o.   MRN: [id number] GYNECOLOGY VISIT  PCP:   Marga Melnick, MD  Referring provider:   HPI: 67 y.o.   Married  Caucasian  female   G3P3 with No LMP recorded. Patient has had a hysterectomy.   here for   AEX.  History of pulmonary embolus in 2007.  This followed abdominoplasty and liposuction.  Not currently on coumadin or blood thinners.  Not on hormone therapy for years.   Some extremity pain since back surgery in 2014.  Bothers patient when sleeping.   Sees Dermatology regularly.   Hgb:    PCP Urine:  Neg  GYNECOLOGIC HISTORY: No LMP recorded. Patient has had a hysterectomy.   - 1980s for abnormal uterine bleeding and endometriosis.  Ovaries remain.  Sexually active:  yes Partner preference: female Contraception:  TAH  Menopausal hormone therapy: none DES exposure:   no Blood transfusions:   no Sexually transmitted diseases:   no GYN Procedures:  Tubal  Ligation, TAH Mammogram:  01/2013 wnl:[facility]               Pap:  04-16-01 wnl  History of abnormal pap smear:   no   OB History   Grav Para Term Preterm Abortions TAB SAB Ect Mult Living   3 3        3        LIFESTYLE: Exercise:    Walking and tennis         Tobacco:    no Alcohol:      socially Drug use:   no  OTHER HEALTH MAINTENANCE: Tetanus/TDap:   Unsure  Gardisil:               n/a Influenza:             08/2013 Zostavax:              never  Bone density:     02-13-13:osteopenia:[facility] Colonoscopy:     Due for colonoscopy in January 2015 with Dr. Yancey Flemings  Cholesterol check:   Borderline with PCP  Family History  Problem Relation Age of Onset  . Tics Father   . Lung cancer Father   . Breast cancer Mother   . Diabetes Paternal Grandmother     Patient Active Problem List   Diagnosis Date Noted  . Skin cancer 09/03/2013  . NONSPEC ELEVATION OF LEVELS OF TRANSAMINASE/LDH 10/18/2010  . DIVERTICULITIS-COLON  09/28/2010  . DEGENERATIVE JOINT DISEASE, LEFT KNEE 09/15/2010  . PULMONARY EMBOLISM, HX OF 09/15/2010  . RHINITIS 05/14/2008  . COLONIC POLYPS 01/22/2008  . ESOPHAGITIS 01/22/2008  . DIVERTICULAR DISEASE 01/22/2008  . CONSTIPATION, CHRONIC 01/22/2008  . DEPRESSION, HX OF 01/22/2008  . GASTROESOPHAGEAL REFLUX DISEASE, CHRONIC 08/13/2007  . HYPERLIPIDEMIA 01/06/2007   Past Medical History  Diagnosis Date  . Benign neoplasm of colon   . Esophagitis, unspecified   . Other and unspecified hyperlipidemia   . Other constipation   . Diverticulosis of colon (without mention of hemorrhage)   . Personal history of other mental disorder   . Other and unspecified hyperlipidemia   . Pulmonary embolism     2006  . Complication of anesthesia     anectine - caused her to have difficulty waking up and was on vent for few hours post op  . PONV (postoperative nausea and vomiting)   . Palpitations   . Esophageal reflux     few  times per month not routine  . Headache(784.0)   . Arthritis   . Endometriosis     reason for hysterectomy  . Osteopenia     Past Surgical History  Procedure Laterality Date  . Partial hysterectomy    . Appendectomy    . Tubal ligation    . Lumbar laminectomy    . Abdominoplasty    . Total knee arthroplasty    . Abdominal hysterectomy    . Joint replacement      knee replacement  . Liposuction    . Cardiac catheterization      09/06/10: normal coronaries and LV function (saw Dr. Alanda Amass in 2011)    ALLERGIES: Succinylcholine and Multi vitamin-fluoride  Current Outpatient Prescriptions  Medication Sig Dispense Refill  . ibuprofen (ADVIL,MOTRIN) 200 MG tablet Take 800 mg by mouth every 6 (six) hours as needed for pain.      . Lorcaserin HCl (BELVIQ) 10 MG TABS Take 1 tablet by mouth 2 (two) times daily.      . pantoprazole (PROTONIX) 20 MG tablet Take 20 mg by mouth daily as needed. Acid reflux.      . rizatriptan (MAXALT-MLT) 10 MG disintegrating tablet  Take 10 mg by mouth as needed for migraine. May repeat in 2 hours if needed       No current facility-administered medications for this visit.     ROS:  Pertinent items are noted in HPI.  SOCIAL HISTORY:  11 grandchildren.  Homemaker.  PHYSICAL EXAMINATION:    BP 110/68  Pulse 70  Resp 16  Ht 5\' 2"  (1.575 m)  Wt 163 lb (73.936 kg)  BMI 29.81 kg/m2   Wt Readings from Last 3 Encounters:  11/10/13 163 lb (73.936 kg)  02/05/13 166 lb 11.2 oz (75.615 kg)  10/23/12 156 lb (70.761 kg)     Ht Readings from Last 3 Encounters:  11/10/13 5\' 2"  (1.575 m)  02/05/13 5\' 2"  (1.575 m)  05/27/12 5' 2.5" (1.588 m)    General appearance: alert, cooperative and appears stated age Head: Normocephalic, without obvious abnormality, atraumatic Neck: no adenopathy, supple, symmetrical, trachea midline and thyroid not enlarged, symmetric, no tenderness/mass/nodules Lungs: clear to auscultation bilaterally Breasts: Inspection negative, No nipple retraction or dimpling, No nipple discharge or bleeding, No axillary or supraclavicular adenopathy, Normal to palpation without dominant masses Heart: regular rate and rhythm Abdomen: Transverse lower abdominal and periumbilical incisions, soft, non-tender; no masses,  no organomegaly Extremities: extremities normal, atraumatic, no cyanosis or edema Skin: Skin color, texture, turgor normal. No rashes or lesions.  Multiple darkly pigmented nevi. Lymph nodes: Cervical, supraclavicular, and axillary nodes normal. No abnormal inguinal nodes palpated Neurologic: Grossly normal  Pelvic: External genitalia:  no lesions              Urethra:  normal appearing urethra with no masses, tenderness or lesions              Bartholins and Skenes: normal                 Vagina: normal appearing vagina with normal color and discharge, no lesions,  Small left ovary vs. Loop of bowel palpable at left vaginal apex, nontender.              Cervix:  absent              Pap and  high risk HPV testing done: no.            Bimanual Exam:  Uterus:   absent                                      Adnexa: normal adnexa in size, nontender and no masses                                      Rectovaginal: Confirms bimanual examination.                                       Anus:  normal sphincter tone, no lesions  ASSESSMENT  Normal gynecologic exam. Status post TAH. History of pulmonary embolus. Osteopenia.  Multiple pigmented nevi.  Sees Dermatology regularly.  Extremity pain following back surgery.  PLAN  Mammogram yearly.  Pap smear and high risk HPV testing not indicated. Discussed weight bearing exercise, Calcium and vitamin D exposure. Repeat bone density in 2 years. Continue with regular dermatology visits. I recommended patient return to her back surgeon to discuss her pain issues. Return annually or prn    An After Visit Summary was printed and given to the patient.

## 2013-11-10 NOTE — Patient Instructions (Signed)
EXERCISE AND DIET:  We recommended that you start or continue a regular exercise program for good health. Regular exercise means any activity that makes your heart beat faster and makes you sweat.  We recommend exercising at least 30 minutes per day at least 3 days a week, preferably 4 or 5.  We also recommend a diet low in fat and sugar.  Inactivity, poor dietary choices and obesity can cause diabetes, heart attack, stroke, and kidney damage, among others.    ALCOHOL AND SMOKING:  Women should limit their alcohol intake to no more than 7 drinks/beers/glasses of wine (combined, not each!) per week. Moderation of alcohol intake to this level decreases your risk of breast cancer and liver damage. And of course, no recreational drugs are part of a healthy lifestyle.  And absolutely no smoking or even second hand smoke. Most people know smoking can cause heart and lung diseases, but did you know it also contributes to weakening of your bones? Aging of your skin?  Yellowing of your teeth and nails?  CALCIUM AND VITAMIN D:  Adequate intake of calcium and Vitamin D are recommended.  The recommendations for exact amounts of these supplements seem to change often, but generally speaking 600 mg of calcium (either carbonate or citrate) and 800 units of Vitamin D per day seems prudent. Certain women may benefit from higher intake of Vitamin D.  If you are among these women, your doctor will have told you during your visit.    PAP SMEARS:  Pap smears, to check for cervical cancer or precancers,  have traditionally been done yearly, although recent scientific advances have shown that most women can have pap smears less often.  However, every woman still should have a physical exam from her gynecologist every year. It will include a breast check, inspection of the vulva and vagina to check for abnormal growths or skin changes, a visual exam of the cervix, and then an exam to evaluate the size and shape of the uterus and  ovaries.  And after 66 years of age, a rectal exam is indicated to check for rectal cancers. We will also provide age appropriate advice regarding health maintenance, like when you should have certain vaccines, screening for sexually transmitted diseases, bone density testing, colonoscopy, mammograms, etc.   MAMMOGRAMS:  All women over 40 years old should have a yearly mammogram. Many facilities now offer a "3D" mammogram, which may cost around $50 extra out of pocket. If possible,  we recommend you accept the option to have the 3D mammogram performed.  It both reduces the number of women who will be called back for extra views which then turn out to be normal, and it is better than the routine mammogram at detecting truly abnormal areas.    COLONOSCOPY:  Colonoscopy to screen for colon cancer is recommended for all women at age 50.  We know, you hate the idea of the prep.  We agree, BUT, having colon cancer and not knowing it is worse!!  Colon cancer so often starts as a polyp that can be seen and removed at colonscopy, which can quite literally save your life!  And if your first colonoscopy is normal and you have no family history of colon cancer, most women don't have to have it again for 10 years.  Once every ten years, you can do something that may end up saving your life, right?  We will be happy to help you get it scheduled when you are ready.    Be sure to check your insurance coverage so you understand how much it will cost.  It may be covered as a preventative service at no cost, but you should check your particular policy.     Tetanus, Diphtheria, Pertussis (Tdap) Vaccine What You Need to Know WHY GET VACCINATED? Tetanus, diphtheria and pertussis can be very serious diseases, even for adolescents and adults. Tdap vaccine can protect us from these diseases. TETANUS (Lockjaw) causes painful muscle tightening and stiffness, usually all over the body.  It can lead to tightening of muscles in the  head and neck so you can't open your mouth, swallow, or sometimes even breathe. Tetanus kills about 1 out of 5 people who are infected. DIPHTHERIA can cause a thick coating to form in the back of the throat.  It can lead to breathing problems, paralysis, heart failure, and death. PERTUSSIS (Whooping Cough) causes severe coughing spells, which can cause difficulty breathing, vomiting and disturbed sleep.  It can also lead to weight loss, incontinence, and rib fractures. Up to 2 in 100 adolescents and 5 in 100 adults with pertussis are hospitalized or have complications, which could include pneumonia and death. These diseases are caused by bacteria. Diphtheria and pertussis are spread from person to person through coughing or sneezing. Tetanus enters the body through cuts, scratches, or wounds. Before vaccines, the [place] saw as many as 200,000 cases a year of diphtheria and pertussis, and hundreds of cases of tetanus. Since vaccination began, tetanus and diphtheria have dropped by about 99% and pertussis by about 80%. TDAP VACCINE Tdap vaccine can protect adolescents and adults from tetanus, diphtheria, and pertussis. One dose of Tdap is routinely given at age 11 or 12. People who did not get Tdap at that age should get it as soon as possible. Tdap is especially important for health care professionals and anyone having close contact with a baby younger than 12 months. Pregnant women should get a dose of Tdap during every pregnancy, to protect the newborn from pertussis. Infants are most at risk for severe, life-threatening complications from pertussis. A similar vaccine, called Td, protects from tetanus and diphtheria, but not pertussis. A Td booster should be given every 10 years. Tdap may be given as one of these boosters if you have not already gotten a dose. Tdap may also be given after a severe cut or burn to prevent tetanus infection. Your doctor can give you more information. Tdap may  safely be given at the same time as other vaccines. SOME PEOPLE SHOULD NOT GET THIS VACCINE  If you ever had a life-threatening allergic reaction after a dose of any tetanus, diphtheria, or pertussis containing vaccine, OR if you have a severe allergy to any part of this vaccine, you should not get Tdap. Tell your doctor if you have any severe allergies.  If you had a coma, or long or multiple seizures within 7 days after a childhood dose of DTP or DTaP, you should not get Tdap, unless a cause other than the vaccine was found. You can still get Td.  Talk to your doctor if you:  have epilepsy or another nervous system problem,  had severe pain or swelling after any vaccine containing diphtheria, tetanus or pertussis,  ever had Guillain-Barr Syndrome (GBS),  aren't feeling well on the day the shot is scheduled. RISKS OF A VACCINE REACTION With any medicine, including vaccines, there is a chance of side effects. These are usually mild and go away on their own, but   serious reactions are also possible. Brief fainting spells can follow a vaccination, leading to injuries from falling. Sitting or lying down for about 15 minutes can help prevent these. Tell your doctor if you feel dizzy or light-headed, or have vision changes or ringing in the ears. Mild problems following Tdap (Did not interfere with activities)  Pain where the shot was given (about 3 in 4 adolescents or 2 in 3 adults)  Redness or swelling where the shot was given (about 1 person in 5)  Mild fever of at least 100.4F (up to about 1 in 25 adolescents or 1 in 100 adults)  Headache (about 3 or 4 people in 10)  Tiredness (about 1 person in 3 or 4)  Nausea, vomiting, diarrhea, stomach ache (up to 1 in 4 adolescents or 1 in 10 adults)  Chills, body aches, sore joints, rash, swollen glands (uncommon) Moderate problems following Tdap (Interfered with activities, but did not require medical attention)  Pain where the shot was  given (about 1 in 5 adolescents or 1 in 100 adults)  Redness or swelling where the shot was given (up to about 1 in 16 adolescents or 1 in 25 adults)  Fever over 102F (about 1 in 100 adolescents or 1 in 250 adults)  Headache (about 3 in 20 adolescents or 1 in 10 adults)  Nausea, vomiting, diarrhea, stomach ache (up to 1 or 3 people in 100)  Swelling of the entire arm where the shot was given (up to about 3 in 100). Severe problems following Tdap (Unable to perform usual activities, required medical attention)  Swelling, severe pain, bleeding and redness in the arm where the shot was given (rare). A severe allergic reaction could occur after any vaccine (estimated less than 1 in a million doses). WHAT IF THERE IS A SERIOUS REACTION? What should I look for?  Look for anything that concerns you, such as signs of a severe allergic reaction, very high fever, or behavior changes. Signs of a severe allergic reaction can include hives, swelling of the face and throat, difficulty breathing, a fast heartbeat, dizziness, and weakness. These would start a few minutes to a few hours after the vaccination. What should I do?  If you think it is a severe allergic reaction or other emergency that can't wait, call 9-1-1 or get the person to the nearest hospital. Otherwise, call your doctor.  Afterward, the reaction should be reported to the "Vaccine Adverse Event Reporting System" (VAERS). Your doctor might file this report, or you can do it yourself through the VAERS web site at www.vaers.hhs.gov, or by calling 1-800-822-7967. VAERS is only for reporting reactions. They do not give medical advice.  THE NATIONAL VACCINE INJURY COMPENSATION PROGRAM The National Vaccine Injury Compensation Program (VICP) is a federal program that was created to compensate people who may have been injured by certain vaccines. Persons who believe they may have been injured by a vaccine can learn about the program and about  filing a claim by calling 1-800-338-2382 or visiting the VICP website at www.hrsa.gov/vaccinecompensation. HOW CAN I LEARN MORE?  Ask your doctor.  Call your local or state health department.  Contact the Centers for Disease Control and Prevention (CDC):  Call 1-800-232-4636 or visit CDC's website at www.cdc.gov/vaccines. CDC Tdap Vaccine VIS (03/28/12) Document Released: 05/07/2012 Document Revised: 03/03/2013 Document Reviewed: 02/26/2013 [employer] Patient Information 2014 [employer].  

## 2013-11-18 ENCOUNTER — Ambulatory Visit (AMBULATORY_SURGERY_CENTER): Payer: Self-pay

## 2013-11-18 ENCOUNTER — Telehealth: Payer: Self-pay

## 2013-11-18 VITALS — Ht 63.0 in | Wt 162.0 lb

## 2013-11-18 DIAGNOSIS — Z860100 Personal history of colon polyps, unspecified: Secondary | ICD-10-CM

## 2013-11-18 MED ORDER — MOVIPREP 100 G PO SOLR: 1.0000 | Freq: Once | ORAL | Status: DC

## 2013-11-18 NOTE — Telephone Encounter (Signed)
Pt with hx of anesthesia reaction to succs.

## 2013-11-19 NOTE — Telephone Encounter (Signed)
Lisa Moody,  Lisa Moody is cleared for LEC.  Thanks,  Jonny Ruiz

## 2013-11-26 ENCOUNTER — Telehealth: Payer: Self-pay | Admitting: Internal Medicine

## 2013-11-26 NOTE — Telephone Encounter (Signed)
Pt has not picked her prep up at pharmacy yet, so it isn't "lost".  Advised pt to go to [facility] on [address] to pick up prep.

## 2013-12-04 ENCOUNTER — Ambulatory Visit (AMBULATORY_SURGERY_CENTER): Payer: BC Managed Care – PPO | Admitting: Internal Medicine

## 2013-12-04 ENCOUNTER — Encounter: Payer: Self-pay | Admitting: Internal Medicine

## 2013-12-04 VITALS — BP 131/77 | HR 67 | Temp 98.0°F | Resp 15 | Ht 63.0 in | Wt 162.0 lb

## 2013-12-04 DIAGNOSIS — D126 Benign neoplasm of colon, unspecified: Secondary | ICD-10-CM

## 2013-12-04 DIAGNOSIS — K5730 Diverticulosis of large intestine without perforation or abscess without bleeding: Secondary | ICD-10-CM

## 2013-12-04 DIAGNOSIS — Z86010 Personal history of colonic polyps: Secondary | ICD-10-CM

## 2013-12-04 MED ORDER — SODIUM CHLORIDE 0.9 % IV SOLN: 500.0000 mL | INTRAVENOUS | Status: DC

## 2013-12-04 NOTE — Progress Notes (Signed)
No complaints noted in the recovery room. Maw   

## 2013-12-04 NOTE — Progress Notes (Signed)
A/ox3 pleased with MAC, report to Annette RN 

## 2013-12-04 NOTE — Progress Notes (Signed)
Called to room to assist during endoscopic procedure.  Patient ID and intended procedure confirmed with present staff. Received instructions for my participation in the procedure from the performing physician.  

## 2013-12-04 NOTE — Patient Instructions (Addendum)
YOU HAD AN ENDOSCOPIC PROCEDURE TODAY AT THE [facility]: Refer to the procedure report that was given to you for any specific questions about what was found during the examination.  If the procedure report does not answer your questions, please call your gastroenterologist to clarify.  If you requested that your care partner not be given the details of your procedure findings, then the procedure report has been included in a sealed envelope for you to review at your convenience later.  YOU SHOULD EXPECT: Some feelings of bloating in the abdomen. Passage of more gas than usual.  Walking can help get rid of the air that was put into your GI tract during the procedure and reduce the bloating. If you had a lower endoscopy (such as a colonoscopy or flexible sigmoidoscopy) you may notice spotting of blood in your stool or on the toilet paper. If you underwent a bowel prep for your procedure, then you may not have a normal bowel movement for a few days.  DIET: Your first meal following the procedure should be a light meal and then it is ok to progress to your normal diet.  A half-sandwich or bowl of soup is an example of a good first meal.  Heavy or fried foods are harder to digest and may make you feel nauseous or bloated.  Likewise meals heavy in dairy and vegetables can cause extra gas to form and this can also increase the bloating.  Drink plenty of fluids but you should avoid alcoholic beverages for 24 hours.  ACTIVITY: Your care partner should take you home directly after the procedure.  You should plan to take it easy, moving slowly for the rest of the day.  You can resume normal activity the day after the procedure however you should NOT DRIVE or use heavy machinery for 24 hours (because of the sedation medicines used during the test).    SYMPTOMS TO REPORT IMMEDIATELY: A gastroenterologist can be reached at any hour.  During normal business hours, 8:30 AM to 5:00 PM Monday through Friday,  call [phone].  After hours and on weekends, please call the GI answering service at [phone] who will take a message and have the physician on call contact you.   Following lower endoscopy (colonoscopy or flexible sigmoidoscopy):  Excessive amounts of blood in the stool  Significant tenderness or worsening of abdominal pains  Swelling of the abdomen that is new, acute  Fever of 100F or higher   FOLLOW UP: If any biopsies were taken you will be contacted by phone or by letter within the next 1-3 weeks.  Call your gastroenterologist if you have not heard about the biopsies in 3 weeks.  Our staff will call the home number listed on your records the next business day following your procedure to check on you and address any questions or concerns that you may have at that time regarding the information given to you following your procedure. This is a courtesy call and so if there is no answer at the home number and we have not heard from you through the emergency physician on call, we will assume that you have returned to your regular daily activities without incident.  SIGNATURES/CONFIDENTIALITY: You and/or your care partner have signed paperwork which will be entered into your electronic medical record.  These signatures attest to the fact that that the information above on your After Visit Summary has been reviewed and is understood.  Full responsibility of the confidentiality of   this discharge information lies with you and/or your care-partner.    You may resume your current medications today. Handouts were given to your care partner on polyps, diverticulosis and a high fiber diet with liberal fluid intake. Please call if any questions or concerns.

## 2013-12-04 NOTE — Op Note (Signed)
[facility]  [address] [place], [id number]   COLONOSCOPY PROCEDURE REPORT  PATIENT: Lisa, Moody  MR#: [id number] BIRTHDATE: [birth year] , 67  yrs. old GENDER: Female ENDOSCOPIST: Eustace Quail, MD REFERRED NW:[id number] Program Recall PROCEDURE DATE:  12/04/2013 PROCEDURE:   Colonoscopy with snare polypectomy x 2 First Screening Colonoscopy - Avg.  risk and is 50 yrs.  old or older - No.  Prior Negative Screening - Now for repeat screening. N/A  History of Adenoma - Now for follow-up colonoscopy & has been > or = to 3 yrs.  Yes hx of adenoma.  Has been 3 or more years since last colonoscopy.  Polyps Removed Today? Yes. ASA CLASS:   Class II INDICATIONS:Patient's personal history of adenomatous colon polyps. Index 2003 (neg); 11-2007 (small TAs) MEDICATIONS: MAC sedation, administered by CRNA and propofol (Diprivan) 300mg  IV DESCRIPTION OF PROCEDURE:   After the risks benefits and alternatives of the procedure were thoroughly explained, informed consent was obtained.  A digital rectal exam revealed no abnormalities of the rectum.   The LB [id number]  endoscope was introduced through the anus and advanced to the cecum, which was identified by both the appendix and ileocecal valve. No adverse events experienced.   The quality of the prep was excellent, using MoviPrep  The instrument was then slowly withdrawn as the colon was fully examined.  COLON FINDINGS: Two sessile polyps were found at the cecum  (84mm) and in the ascending colon (35mm).  A polypectomy was performed with a cold snare.  The resection was complete and the polyp tissue was completely retrieved.   Severe diverticulosis was noted throughout the entire examined colon.   The colon mucosa was otherwise normal. Retroflexed views revealed internal hemorrhoids. The time to cecum=4 minutes 08 seconds.  Withdrawal time=17 minutes 56 seconds. The scope was withdrawn and the procedure  completed. COMPLICATIONS: There were no complications.  ENDOSCOPIC IMPRESSION: 1.   Two sessile polyps were found at the cecum and in the ascending colon; polypectomy was performed with a cold snare 2.   Severe diverticulosis was noted throughout the entire examined colon 3.   The colon mucosa was otherwise normal  RECOMMENDATIONS: 1. Follow up colonoscopy in 5 years   eSigned:  Eustace Quail, MD 12/04/2013 10:51 AM   cc: Hendricks Limes, MD and The Patient

## 2013-12-05 ENCOUNTER — Telehealth: Payer: Self-pay | Admitting: *Deleted

## 2013-12-05 NOTE — Telephone Encounter (Signed)
  Follow up Call-  Call back number 12/04/2013  Post procedure Call Back phone  # [phone]  Permission to leave phone message Yes     Patient questions:  Do you have a fever, pain , or abdominal swelling? no Pain Score  0 *  Have you tolerated food without any problems? yes  Have you been able to return to your normal activities? yes  Do you have any questions about your discharge instructions: Diet   no Medications  no Follow up visit  no  Do you have questions or concerns about your Care? no  Actions: * If pain score is 4 or above: No action needed, pain <4.

## 2013-12-09 ENCOUNTER — Encounter: Payer: Self-pay | Admitting: Internal Medicine

## 2014-02-13 ENCOUNTER — Other Ambulatory Visit: Payer: Self-pay | Admitting: Dermatology

## 2014-02-17 ENCOUNTER — Encounter: Payer: Self-pay | Admitting: Internal Medicine

## 2014-02-17 DIAGNOSIS — D229 Melanocytic nevi, unspecified: Secondary | ICD-10-CM | POA: Insufficient documentation

## 2014-04-07 ENCOUNTER — Telehealth: Payer: Self-pay

## 2014-04-07 ENCOUNTER — Ambulatory Visit (INDEPENDENT_AMBULATORY_CARE_PROVIDER_SITE_OTHER): Payer: BC Managed Care – PPO | Admitting: Nurse Practitioner

## 2014-04-07 ENCOUNTER — Telehealth: Payer: Self-pay | Admitting: Nurse Practitioner

## 2014-04-07 ENCOUNTER — Telehealth: Payer: Self-pay | Admitting: Obstetrics and Gynecology

## 2014-04-07 ENCOUNTER — Ambulatory Visit: Payer: Medicare Other | Admitting: Nurse Practitioner

## 2014-04-07 ENCOUNTER — Encounter: Payer: Self-pay | Admitting: Nurse Practitioner

## 2014-04-07 VITALS — BP 120/70 | HR 90 | Temp 98.4°F | Wt 155.0 lb

## 2014-04-07 DIAGNOSIS — N9489 Other specified conditions associated with female genital organs and menstrual cycle: Secondary | ICD-10-CM

## 2014-04-07 LAB — POCT URINALYSIS DIPSTICK
BILIRUBIN UA: NEGATIVE
Blood, UA: NEGATIVE
GLUCOSE UA: NEGATIVE
KETONES UA: NEGATIVE
NITRITE UA: NEGATIVE
PH UA: 5
Urobilinogen, UA: NEGATIVE

## 2014-04-07 LAB — CBC WITH DIFFERENTIAL/PLATELET
BASOS ABS: 0 10*3/uL (ref 0.0–0.1)
EOS ABS: 0 10*3/uL (ref 0.0–0.7)
HCT: 40.2 % (ref 36.0–46.0)
Hemoglobin: 12.7 g/dL (ref 12.0–15.0)
Lymphocytes Relative: 21 % (ref 12–46)
Lymphs Abs: 1.6 10*3/uL (ref 0.7–4.0)
MCH: 28.3 pg (ref 26.0–34.0)
MCHC: 31.6 g/dL (ref 30.0–36.0)
MCV: 89.9 fL (ref 78.0–100.0)
Monocytes Absolute: 0.4 10*3/uL (ref 0.1–1.0)
Monocytes Relative: 6 % (ref 3–12)
NEUTROS PCT: 73 % (ref 43–77)
Neutro Abs: 5.4 10*3/uL (ref 1.7–7.7)
Platelets: 218 10*3/uL (ref 150–400)
RBC: 4.48 MIL/uL (ref 3.87–5.11)
RDW: 13.7 % (ref 11.5–15.5)
WBC: 7.4 10*3/uL (ref 4.0–10.5)

## 2014-04-07 MED ORDER — ESTRADIOL 10 MCG VA TABS: 1.0000 | ORAL_TABLET | VAGINAL | Status: DC

## 2014-04-07 NOTE — Telephone Encounter (Signed)
Spoke with patient. Patient states that she is having pain in her right lower stomach/groin and vaginal area. Patient states this has been going on for two weeks and began while moving. Thought it was related to back troubles but it has not gone away. Patient is taking aleve for pain that is a 5/10 constantly. Patient denies fevers, nausea, vomiting, and urinary/bowel symptoms. Patient states that the pain feels like "a throbbing pressure." Advised patient that we would like her to come in for OV for evaluation. Patient agreeable. Advised Dr.Silva is out of the office today but she could be seen by another provider today. Patient states "Are they doctors?" Advised patient that she can be seen by NP and if further evaluation is needed we have a doctor available who can come in and take a second look at patient. Patient agreeable. 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 Requesting appointment at 3:45. Patient requesting to see Milford Cage, FNP as she states she has seen her in the past. 4:15pm appointment scheduled with Milford Cage, FNP for today. Patient agreeable to date and time.  Routing to provider for final review. Patient agreeable to disposition. Will close encounter  CC: Milford Cage, FNP

## 2014-04-07 NOTE — Patient Instructions (Signed)
Will call with lab results.

## 2014-04-07 NOTE — Progress Notes (Signed)
Subjective:     Patient ID: Lisa Moody, female   DOB: 1946/11/11, 68 y.o.   MRN: [id number]  HPI  This 68 yo WM Fe presents with right lower pelvic and vaginal pain for past 2 weeks.  Seemed to be related to recent move and lifting of boxes.  They are having some upgrades at their home and had to move out on a temporary basis. This 'pain' occurred with sudden onset with getting out of a car when she first noted the pulling sensation in the vaginal area.  Some vaginal 'bulging' but able to feel this going back in.This occurred about the last week in April.  Some days very mild and some days more intense.  Also had low back pain that she felt was muscular.  Has history of diverticulitis and constipation right after the move.  She was able to treat with laxatives and now she is regular.  No evidence of mucous or blood. There was some nausea and bloating.  No urinary symptoms.    Review of Systems  Constitutional: Positive for fatigue. Negative for fever, chills and diaphoresis.  Respiratory: Negative.   Cardiovascular: Negative.   Gastrointestinal: Positive for nausea, abdominal pain and constipation. Negative for vomiting, diarrhea, blood in stool, anal bleeding and rectal pain.  Genitourinary: Positive for vaginal pain and pelvic pain. Negative for dysuria, urgency, frequency, hematuria, flank pain, vaginal bleeding, vaginal discharge, difficulty urinating and dyspareunia.  Musculoskeletal: Positive for arthralgias, back pain and myalgias.  Skin: Negative.   Neurological: Negative.   Psychiatric/Behavioral: Negative.        Objective:   Physical Exam  Constitutional: She is oriented to person, place, and time. She appears well-developed and well-nourished. No distress.  Pulmonary/Chest: Effort normal and breath sounds normal.  Abdominal: Soft. Bowel sounds are normal. She exhibits no distension and no mass. There is tenderness. There is no rebound and no guarding.  Pain is right mid to  lower quadrant without rebounding  Genitourinary:  No vaginal discharge.  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  No mass.  Rectal is normal.  Musculoskeletal:  ROM of hip and lower extremities without back pain.  Only causes pressure on right lower quadrant with external rotation.  Neurological: She is alert and oriented to person, place, and time.  Skin: Skin is warm and dry.  Psychiatric: She has a normal mood and affect. Her behavior is normal. Judgment and thought content normal.       Assessment:     Right lower quadrant pain - with recent trauma of lifting. 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  I questioned her about history of blood clots and she denied history so put in the RX for Vagifem - then after review found history of PE following surgery - so called the pharmacy and declined med's.  I tried to call the patient and no answer. We also discussed treatment with Advil and rest with legs elevated on a pillow for relief. Will follow urine culture

## 2014-04-07 NOTE — Telephone Encounter (Signed)
Patient states she is having lower abdominal  and vaginal discomfort and wants to see dr Quincy Simmonds

## 2014-04-07 NOTE — Telephone Encounter (Signed)
I called pharmacy and declined the original RX of Vagifem secondary to patient history of PE in the past.  I then tried to call patient and no answer or voice mail available.

## 2014-04-07 NOTE — Telephone Encounter (Signed)
Spoke with patient. Advised it is highly recommended that patient be seen prior to picking up grand kids from school. As this will give [place] more time for evaluation and availability of doctors and orders if we need them. Patient agreeable and states that she will be on her way now. Advised we will be working her in to [employer], [place] schedule. Patient agreeable. Appointment scheduled for 1:45pm with Milford Cage, FNP. Front notified of work in.  Routing to provider for final review. Patient agreeable to disposition. Will close encounter  CC: Milford Cage, FNP

## 2014-04-08 ENCOUNTER — Telehealth: Payer: Self-pay | Admitting: *Deleted

## 2014-04-08 LAB — URINE CULTURE
COLONY COUNT: NO GROWTH
Organism ID, Bacteria: NO GROWTH

## 2014-04-08 NOTE — Telephone Encounter (Signed)
Returning a call to Stephanie. °

## 2014-04-08 NOTE — Telephone Encounter (Signed)
I have attempted to contact this patient by phone with the following results: left message to return my call on answering machine (home).  

## 2014-04-08 NOTE — Telephone Encounter (Signed)
Message copied by Graylon Good on Wed Apr 08, 2014  8:34 AM ------      Message from: Antonietta Barcelona      Created: Tue Apr 07, 2014  4:41 PM       Please let patient know that CBC is normal and continue as planned. Rest and reduce lifting.  Please check on her tomorrow ------

## 2014-04-08 NOTE — Telephone Encounter (Signed)
RC to patient.  Notified of results.  She is feeling about the same as yesterday.  She will rest and reduce lifting as recommended.   Pt asks about vaginal estrogen RX.  States this was discussed at office visit.  Pt would like Rx sent to [facility]. Please advise.

## 2014-04-08 NOTE — Telephone Encounter (Signed)
We did discuss vaginal estrogen and I asked if she had any risk factors such as blood clots - she said no.  So I right away put order in as we talked.  Then when I went to finish her chart and reviewed every thing she had a history of PE in the past.  I immediately called the pharmacist and stopped the Vagifem order.  I then tried to call her and got no answer.  I made a phone note to that effect.  She is Dr. Elza Rafter patient and not sure if she would give this either.  She indicated that she wanted to return and see Dr. Quincy Simmonds for possible surgical intervention of her prolapse.  Perhaps she would like to make that appointment.

## 2014-04-09 NOTE — Telephone Encounter (Signed)
[name],  Please contact my patient and have her make an appointment to see me regarding pelvic organ prolapse.  Her Vagifem was cancelled at the pharmacy due to her pulmonary embolus history.   Cc - [name]

## 2014-04-10 NOTE — Telephone Encounter (Signed)
Pt making second call to ask why prescription was cancelled at pharmacy.  I was given call by front desk.  Advised pt of Dr Elza Rafter and Patty's  recommendation below.  Pt voices understanding of reasoning for cancelling estrogen and is agreeable with making an appointment to discuss this with Dr. Quincy Simmonds.  Pt was not home at the time and was unable to schedule an appt.  Pt states she will call back next week to schedule with Dr. Quincy Simmonds.  Pt also given urine culture results.  Sending to provider for review.  Pt agreeable with disposition.  Closing encounter.

## 2014-04-10 NOTE — Telephone Encounter (Signed)
Patient is calling asking why her prescription was canceled.

## 2014-04-13 NOTE — Progress Notes (Signed)
Encounter reviewed by Dr. Brook Silva.  

## 2014-04-16 ENCOUNTER — Other Ambulatory Visit: Payer: Self-pay | Admitting: Dermatology

## 2014-04-23 ENCOUNTER — Encounter: Payer: Self-pay | Admitting: Internal Medicine

## 2014-09-21 ENCOUNTER — Encounter: Payer: Self-pay | Admitting: Nurse Practitioner

## 2014-09-25 ENCOUNTER — Ambulatory Visit (INDEPENDENT_AMBULATORY_CARE_PROVIDER_SITE_OTHER): Payer: Self-pay | Admitting: *Deleted

## 2014-09-25 ENCOUNTER — Ambulatory Visit: Payer: Medicare Other

## 2014-09-25 DIAGNOSIS — Z23 Encounter for immunization: Secondary | ICD-10-CM

## 2014-11-05 ENCOUNTER — Ambulatory Visit (INDEPENDENT_AMBULATORY_CARE_PROVIDER_SITE_OTHER): Payer: 59 | Admitting: Internal Medicine

## 2014-11-05 ENCOUNTER — Encounter: Payer: Self-pay | Admitting: Internal Medicine

## 2014-11-05 VITALS — BP 120/80 | HR 80 | Temp 98.1°F

## 2014-11-05 DIAGNOSIS — J310 Chronic rhinitis: Secondary | ICD-10-CM

## 2014-11-05 DIAGNOSIS — R110 Nausea: Secondary | ICD-10-CM

## 2014-11-05 MED ORDER — RANITIDINE HCL 150 MG PO TABS: 150.0000 mg | ORAL_TABLET | Freq: Two times a day (BID) | ORAL | Status: DC

## 2014-11-05 NOTE — Progress Notes (Signed)
   Subjective:    Patient ID: Lisa Moody, female    DOB: 12-23-1945, 68 y.o.   MRN: [id number]  HPI   5-6 weeks ago she was seen at the urgent care and treated with amoxicillin for probable frontal sinusitis. She also received an injection ;she is unsure what that was.  The upper respiratory tract symptoms did respond significantly ; ut she had some residual otic discomfort on the left and intermittent frontal headache.  Symptoms have flared slightly last week. She was seen at the urgent care again and they prescribed Augmentin; she has nottaken that yet.  She complains of constant nausea. She also has postnasal drainage.   Review of Systems Facial pain , nasal purulence, dental pain,  or otic discharge denied. No fever , chills or sweats.  Extrinsic symptoms of itchy, watery eyes, sneezing, or angioedema are denied. There is no significant cough, sputum production, wheezing,or  paroxysmal nocturnal dyspnea.  She is under a great deal of stress remodeling her home.     Objective:   Physical Exam General appearance:good health ;well nourished; no acute distress or increased work of breathing is present.  No  lymphadenopathy about the head, neck, or axilla noted.   Eyes: No conjunctival inflammation or lid edema is present. There is no scleral icterus.  Ears:  External ear exam shows no significant lesions or deformities.  Otoscopic examination reveals clear canals, tympanic membranes are intact bilaterally without bulging, retraction, inflammation or discharge. L TM dull.  Nose:  External nasal examination shows no deformity or inflammation. Nasal mucosa are pink and moist without lesions or exudates. No septal dislocation or deviation.No obstruction to airflow.   Oral exam: Dental hygiene is good; lips and gums are healthy appearing.There is no oropharyngeal erythema or exudate noted.   Neck:  No deformities,  masses, or tenderness noted.     Heart:  Normal rate and regular  rhythm. S1 and S2 normal without gallop, murmur, click, rub or other extra sounds.   Lungs:Chest clear to auscultation; no wheezes, rhonchi,rales ,or rubs present.No increased work of breathing.    Extremities:  No cyanosis, edema, or clubbing  noted    Skin: Warm & dry w/o jaundice or tenting.        Assessment & Plan:  #1 non allergic rhinitis #2 nausea , probably from PNDrainage,reflux & stress See Orders & AVS

## 2014-11-05 NOTE — Progress Notes (Signed)
Pre visit review using our clinic review tool, if applicable. No additional management support is needed unless otherwise documented below in the visit note. 

## 2014-11-05 NOTE — Patient Instructions (Signed)
Plain Mucinex (NOT D) for thick secretions ;force NON dairy fluids .   Nasal cleansing in the shower as discussed with lather of mild shampoo.After 10 seconds wash off lather while  exhaling through nostrils. Make sure that all residual soap is removed to prevent irritation.  Flonase OR Nasacort AQ 1 spray in each nostril twice a day as needed. Use the "crossover" technique into opposite nostril spraying toward opposite ear @ 45 degree angle, not straight up into nostril.  Use a Neti pot daily only  as needed for significant sinus congestion; going from open side to congested side . Plain Allegra (NOT D )  160 daily , Loratidine 10 mg , OR Zyrtec 10 mg @ bedtime  as needed for itchy eyes & sneezing.  Reflux of gastric acid may be asymptomatic as this may occur mainly during sleep.The triggers for reflux  include stress; the "aspirin family" ; alcohol; peppermint; and caffeine (coffee, tea, cola, and chocolate). The aspirin family would include aspirin and the nonsteroidal agents such as ibuprofen &  Naproxen. Tylenol would not cause reflux. If having symptoms ; food & drink should be avoided for @ least 2 hours before going to bed.

## 2015-02-11 ENCOUNTER — Encounter (HOSPITAL_BASED_OUTPATIENT_CLINIC_OR_DEPARTMENT_OTHER): Payer: Self-pay | Admitting: *Deleted

## 2015-02-15 DIAGNOSIS — S46019A Strain of muscle(s) and tendon(s) of the rotator cuff of unspecified shoulder, initial encounter: Secondary | ICD-10-CM | POA: Diagnosis present

## 2015-02-15 DIAGNOSIS — Z9889 Other specified postprocedural states: Secondary | ICD-10-CM

## 2015-02-15 DIAGNOSIS — R112 Nausea with vomiting, unspecified: Secondary | ICD-10-CM | POA: Diagnosis present

## 2015-02-15 NOTE — H&P (Signed)
Lisa Moody is an 69 y.o. female.   Chief Complaint: right shoulder rotator cuff tear HPI: Lisa Moody is a 69 year old seen for follow-up evaluation from her persistent right shoulder pain. She underwent an MRI which has shown a complete rotator cuff tear.  We had reinjected her shoulder last week which helped temporarily but her pain has persistent.  She has pain with overhead use and activities, relieved by rest.    Past Medical History  Diagnosis Date  . Benign neoplasm of colon   . Esophagitis, unspecified   . Other and unspecified hyperlipidemia   . Other constipation   . Diverticulosis of colon (without mention of hemorrhage)   . Personal history of other mental disorder   . Other and unspecified hyperlipidemia   . Pulmonary embolism     2006  . Palpitations   . Esophageal reflux     few times per month not routine  . Headache(784.0)   . Arthritis   . Endometriosis     reason for hysterectomy  . Osteopenia   . Complication of anesthesia     anectine - caused her to have difficulty waking up and was on vent for few hours post op  . PONV (postoperative nausea and vomiting)     Past Surgical History  Procedure Laterality Date  . Partial hysterectomy    . Appendectomy    . Tubal ligation    . Lumbar laminectomy    . Abdominoplasty    . Total knee arthroplasty    . Abdominal hysterectomy    . Joint replacement      knee replacement  . Liposuction    . Cardiac catheterization      09/06/10: normal coronaries and LV function (saw Dr. Rollene Fare in 2011)  . Lumbar fusion  March 2014    Family History  Problem Relation Age of Onset  . Tics Father   . Lung cancer Father   . Breast cancer Mother   . Diabetes Paternal Grandmother   . Colon cancer Neg Hx   . Esophageal cancer Neg Hx   . Rectal cancer Neg Hx   . Stomach cancer Neg Hx    Social History:  reports that she has never smoked. She has never used smokeless tobacco. She reports that she drinks about 1.8 oz of  alcohol per week. She reports that she does not use illicit drugs.  Allergies:  Allergies  Allergen Reactions  . Succinylcholine     Respiratory suppression   . Multi Vitamin-Fluoride [Multi Vita-Bets-Fluoride] Nausea Only    No current facility-administered medications for this encounter.  Current outpatient prescriptions:  .  ibuprofen (ADVIL,MOTRIN) 200 MG tablet, Take 800 mg by mouth every 6 (six) hours as needed for pain., Disp: , Rfl:  .  pantoprazole (PROTONIX) 20 MG tablet, Take 20 mg by mouth daily as needed. Acid reflux., Disp: , Rfl:  .  ranitidine (ZANTAC) 150 MG tablet, Take 1 tablet (150 mg total) by mouth 2 (two) times daily., Disp: 60 tablet, Rfl: 2  No results found for this or any previous visit (from the past 27 hour(s)). No results found.  Review of Systems  Constitutional: Negative.   HENT: Negative.   Eyes: Negative.   Respiratory: Negative.   Cardiovascular: Negative.   Gastrointestinal: Negative.   Genitourinary: Negative.   Musculoskeletal: Positive for joint pain.       Right shoulder pain  Skin: Negative.   Neurological: Negative.   Endo/Heme/Allergies: Negative.   Psychiatric/Behavioral: Negative.  Blood pressure 130/85, height 5\' 3"  (1.6 m), weight 70.308 kg (155 lb). Physical Exam  Constitutional: She is oriented to person, place, and time. She appears well-developed and well-nourished.  HENT:  Head: Normocephalic and atraumatic.  Mouth/Throat: Oropharynx is clear and moist.  Eyes: Conjunctivae are normal. Pupils are equal, round, and reactive to light.  Neck: Neck supple.  Cardiovascular: Normal rate.   Respiratory: Effort normal.  GI: Soft.  Genitourinary:  Not pertinent to current symptomatology therefore not examined.  Musculoskeletal:  Examination of her right shoulder reveals pain with overhead use and positive impingement.  She has weakness on rotator cuff stressing.  No instability and full range of motion.  Examination of her  left shoulder reveals full range of motion without pain, swelling, weakness or instability. Vascular exam pulses 2+ and symmetric.    Neurological: She is alert and oriented to person, place, and time.  Skin: Skin is warm and dry.  Psychiatric: She has a normal mood and affect.     Assessment Principal Problem:   Traumatic rotator cuff tear right shoulder Active Problems:   GASTROESOPHAGEAL REFLUX DISEASE, CHRONIC   PULMONARY EMBOLISM, HX OF   PONV (postoperative nausea and vomiting)   Plan I have talked to her and her husband about this in detail.  I would recommend, with these findings and her significant persistent pain, that we proceed with right shoulder arthroscopy with rotator cuff repair.  Risks, complications and benefits of the surgery have been described to them in detail and they understand this completely.    Linda Hedges 02/15/2015, 4:47 PM

## 2015-02-16 ENCOUNTER — Encounter (HOSPITAL_BASED_OUTPATIENT_CLINIC_OR_DEPARTMENT_OTHER): Payer: Self-pay | Admitting: *Deleted

## 2015-02-16 NOTE — Progress Notes (Signed)
No labs needed

## 2015-02-17 ENCOUNTER — Encounter (HOSPITAL_BASED_OUTPATIENT_CLINIC_OR_DEPARTMENT_OTHER): Payer: Self-pay | Admitting: Certified Registered"

## 2015-02-19 ENCOUNTER — Ambulatory Visit (HOSPITAL_BASED_OUTPATIENT_CLINIC_OR_DEPARTMENT_OTHER): Admission: RE | Admit: 2015-02-19 | Payer: 59 | Source: Ambulatory Visit | Admitting: Orthopedic Surgery

## 2015-02-19 SURGERY — SHOULDER ARTHROSCOPY WITH ROTATOR CUFF REPAIR AND SUBACROMIAL DECOMPRESSION
Anesthesia: General | Site: Shoulder | Laterality: Right

## 2015-02-19 MED ORDER — MIDAZOLAM HCL 2 MG/2ML IJ SOLN
INTRAMUSCULAR | Status: AC
  Filled 2015-02-19: qty 2

## 2015-02-19 MED ORDER — FENTANYL CITRATE 0.05 MG/ML IJ SOLN
INTRAMUSCULAR | Status: AC
  Filled 2015-02-19: qty 6

## 2015-02-23 ENCOUNTER — Encounter (HOSPITAL_BASED_OUTPATIENT_CLINIC_OR_DEPARTMENT_OTHER): Payer: Self-pay | Admitting: *Deleted

## 2015-02-23 NOTE — Progress Notes (Signed)
Pt r/s surgery due to uri-better

## 2015-02-26 ENCOUNTER — Encounter (HOSPITAL_BASED_OUTPATIENT_CLINIC_OR_DEPARTMENT_OTHER)
Admission: RE | Disposition: A | Discharge status: Home / Self Care | Payer: Self-pay | Source: Ambulatory Visit | Attending: Orthopedic Surgery

## 2015-02-26 ENCOUNTER — Ambulatory Visit (HOSPITAL_BASED_OUTPATIENT_CLINIC_OR_DEPARTMENT_OTHER): Payer: 59 | Admitting: Certified Registered"

## 2015-02-26 ENCOUNTER — Ambulatory Visit (HOSPITAL_BASED_OUTPATIENT_CLINIC_OR_DEPARTMENT_OTHER)
Admission: RE | Admit: 2015-02-26 | Discharge: 2015-02-26 | Disposition: A | Discharge status: Home / Self Care | Payer: 59 | Source: Ambulatory Visit | Attending: Orthopedic Surgery | Admitting: Orthopedic Surgery

## 2015-02-26 ENCOUNTER — Encounter (HOSPITAL_BASED_OUTPATIENT_CLINIC_OR_DEPARTMENT_OTHER): Payer: Self-pay | Admitting: *Deleted

## 2015-02-26 DIAGNOSIS — Z86711 Personal history of pulmonary embolism: Secondary | ICD-10-CM | POA: Insufficient documentation

## 2015-02-26 DIAGNOSIS — Z86718 Personal history of other venous thrombosis and embolism: Secondary | ICD-10-CM

## 2015-02-26 DIAGNOSIS — Z96659 Presence of unspecified artificial knee joint: Secondary | ICD-10-CM | POA: Insufficient documentation

## 2015-02-26 DIAGNOSIS — W19XXXA Unspecified fall, initial encounter: Secondary | ICD-10-CM | POA: Diagnosis not present

## 2015-02-26 DIAGNOSIS — S46011A Strain of muscle(s) and tendon(s) of the rotator cuff of right shoulder, initial encounter: Secondary | ICD-10-CM | POA: Diagnosis present

## 2015-02-26 DIAGNOSIS — K219 Gastro-esophageal reflux disease without esophagitis: Secondary | ICD-10-CM | POA: Diagnosis not present

## 2015-02-26 DIAGNOSIS — S43431A Superior glenoid labrum lesion of right shoulder, initial encounter: Secondary | ICD-10-CM | POA: Diagnosis not present

## 2015-02-26 DIAGNOSIS — Z90711 Acquired absence of uterus with remaining cervical stump: Secondary | ICD-10-CM | POA: Insufficient documentation

## 2015-02-26 DIAGNOSIS — M7541 Impingement syndrome of right shoulder: Secondary | ICD-10-CM | POA: Diagnosis not present

## 2015-02-26 DIAGNOSIS — E785 Hyperlipidemia, unspecified: Secondary | ICD-10-CM | POA: Insufficient documentation

## 2015-02-26 DIAGNOSIS — Z9851 Tubal ligation status: Secondary | ICD-10-CM | POA: Insufficient documentation

## 2015-02-26 DIAGNOSIS — M1990 Unspecified osteoarthritis, unspecified site: Secondary | ICD-10-CM | POA: Diagnosis not present

## 2015-02-26 DIAGNOSIS — S46019A Strain of muscle(s) and tendon(s) of the rotator cuff of unspecified shoulder, initial encounter: Secondary | ICD-10-CM | POA: Diagnosis present

## 2015-02-26 HISTORY — PX: SHOULDER ARTHROSCOPY WITH ROTATOR CUFF REPAIR AND SUBACROMIAL DECOMPRESSION: SHX5686

## 2015-02-26 LAB — POCT HEMOGLOBIN-HEMACUE: Hemoglobin: 13.2 g/dL (ref 12.0–15.0)

## 2015-02-26 SURGERY — SHOULDER ARTHROSCOPY WITH ROTATOR CUFF REPAIR AND SUBACROMIAL DECOMPRESSION
Anesthesia: Regional | Site: Shoulder | Laterality: Right

## 2015-02-26 MED ORDER — CEFAZOLIN SODIUM-DEXTROSE 2-3 GM-% IV SOLR
2.0000 g | INTRAVENOUS | Status: AC
  Administered 2015-02-26: 2 g via INTRAVENOUS

## 2015-02-26 MED ORDER — OXYCODONE HCL 5 MG PO TABS: ORAL_TABLET | ORAL | Status: DC

## 2015-02-26 MED ORDER — CHLORHEXIDINE GLUCONATE 4 % EX LIQD: 60.0000 mL | Freq: Once | CUTANEOUS | Status: DC

## 2015-02-26 MED ORDER — ROCURONIUM BROMIDE 100 MG/10ML IV SOLN
INTRAVENOUS | Status: DC | PRN
  Administered 2015-02-26: 30 mg via INTRAVENOUS

## 2015-02-26 MED ORDER — CEFAZOLIN SODIUM-DEXTROSE 2-3 GM-% IV SOLR
INTRAVENOUS | Status: AC
Start: 2015-02-26 — End: 2015-02-26
  Filled 2015-02-26: qty 50

## 2015-02-26 MED ORDER — ONDANSETRON HCL 4 MG/2ML IJ SOLN: 4.0000 mg | Freq: Four times a day (QID) | INTRAMUSCULAR | Status: DC | PRN

## 2015-02-26 MED ORDER — EPINEPHRINE HCL 1 MG/ML IJ SOLN
INTRAMUSCULAR | Status: DC | PRN
  Administered 2015-02-26: 4 mg

## 2015-02-26 MED ORDER — NEOSTIGMINE METHYLSULFATE 10 MG/10ML IV SOLN
INTRAVENOUS | Status: DC | PRN
Start: 2015-02-26 — End: 2015-02-26
  Administered 2015-02-26: 3.5 mg via INTRAVENOUS

## 2015-02-26 MED ORDER — GLYCOPYRROLATE 0.2 MG/ML IJ SOLN
INTRAMUSCULAR | Status: DC | PRN
  Administered 2015-02-26: .5 mg via INTRAVENOUS
  Administered 2015-02-26: 0.2 mg via INTRAVENOUS

## 2015-02-26 MED ORDER — OXYCODONE HCL 5 MG PO TABS: 5.0000 mg | ORAL_TABLET | Freq: Once | ORAL | Status: DC | PRN

## 2015-02-26 MED ORDER — ONDANSETRON HCL 4 MG/2ML IJ SOLN
INTRAMUSCULAR | Status: DC | PRN
  Administered 2015-02-26: 4 mg via INTRAVENOUS

## 2015-02-26 MED ORDER — BUPIVACAINE-EPINEPHRINE (PF) 0.5% -1:200000 IJ SOLN
INTRAMUSCULAR | Status: DC | PRN
  Administered 2015-02-26: 30 mL via PERINEURAL

## 2015-02-26 MED ORDER — HYDROMORPHONE HCL 1 MG/ML IJ SOLN: 0.2500 mg | INTRAMUSCULAR | Status: DC | PRN

## 2015-02-26 MED ORDER — FENTANYL CITRATE 0.05 MG/ML IJ SOLN
INTRAMUSCULAR | Status: AC
  Filled 2015-02-26: qty 4

## 2015-02-26 MED ORDER — FENTANYL CITRATE 0.05 MG/ML IJ SOLN
INTRAMUSCULAR | Status: AC
  Filled 2015-02-26: qty 2

## 2015-02-26 MED ORDER — LACTATED RINGERS IV SOLN
INTRAVENOUS | Status: DC
  Administered 2015-02-26 (×2): via INTRAVENOUS

## 2015-02-26 MED ORDER — MIDAZOLAM HCL 2 MG/2ML IJ SOLN
INTRAMUSCULAR | Status: AC
  Filled 2015-02-26: qty 2

## 2015-02-26 MED ORDER — DEXAMETHASONE SODIUM PHOSPHATE 4 MG/ML IJ SOLN
INTRAMUSCULAR | Status: DC | PRN
  Administered 2015-02-26: 10 mg via INTRAVENOUS

## 2015-02-26 MED ORDER — PROPOFOL 10 MG/ML IV BOLUS
INTRAVENOUS | Status: DC | PRN
  Administered 2015-02-26: 130 mg via INTRAVENOUS

## 2015-02-26 MED ORDER — DIAZEPAM 2 MG PO TABS: 2.0000 mg | ORAL_TABLET | Freq: Three times a day (TID) | ORAL | Status: DC | PRN

## 2015-02-26 MED ORDER — FENTANYL CITRATE 0.05 MG/ML IJ SOLN
50.0000 ug | INTRAMUSCULAR | Status: DC | PRN
  Administered 2015-02-26: 50 ug via INTRAVENOUS

## 2015-02-26 MED ORDER — SODIUM CHLORIDE 0.9 % IR SOLN: Status: DC | PRN

## 2015-02-26 MED ORDER — LIDOCAINE HCL (CARDIAC) 20 MG/ML IV SOLN
INTRAVENOUS | Status: DC | PRN
  Administered 2015-02-26: 30 mg via INTRAVENOUS

## 2015-02-26 MED ORDER — OXYCODONE HCL 5 MG/5ML PO SOLN: 5.0000 mg | Freq: Once | ORAL | Status: DC | PRN

## 2015-02-26 MED ORDER — EPHEDRINE SULFATE 50 MG/ML IJ SOLN
INTRAMUSCULAR | Status: DC | PRN
  Administered 2015-02-26 (×2): 10 mg via INTRAVENOUS

## 2015-02-26 MED ORDER — MIDAZOLAM HCL 2 MG/2ML IJ SOLN
1.0000 mg | INTRAMUSCULAR | Status: DC | PRN
  Administered 2015-02-26: 1 mg via INTRAVENOUS

## 2015-02-26 MED ORDER — SODIUM CHLORIDE 0.9 % IR SOLN
Status: DC | PRN
  Administered 2015-02-26: 9500 mL

## 2015-02-26 MED ORDER — EPINEPHRINE HCL 1 MG/ML IJ SOLN
INTRAMUSCULAR | Status: AC
  Filled 2015-02-26: qty 1

## 2015-02-26 MED ORDER — ONDANSETRON 4 MG PO TBDP: ORAL_TABLET | ORAL | Status: DC

## 2015-02-26 MED ORDER — ENOXAPARIN SODIUM 40 MG/0.4ML ~~LOC~~ SOLN: 40.0000 mg | SUBCUTANEOUS | Status: DC

## 2015-02-26 SURGICAL SUPPLY — 80 items
ANCH SUT SWLK 19.1X4.75 (Anchor) ×1 IMPLANT
ANCHOR SUT BIO SW 4.75X19.1 (Anchor) ×2 IMPLANT
APL SKNCLS STERI-STRIP NONHPOA (GAUZE/BANDAGES/DRESSINGS)
BENZOIN TINCTURE PRP APPL 2/3 (GAUZE/BANDAGES/DRESSINGS) IMPLANT
BLADE CUDA 5.5 (BLADE) IMPLANT
BLADE CUTTER GATOR 3.5 (BLADE) ×3 IMPLANT
BLADE GREAT WHITE 4.2 (BLADE) IMPLANT
BLADE GREAT WHITE 4.2MM (BLADE)
BLADE SURG 15 STRL LF DISP TIS (BLADE) IMPLANT
BLADE SURG 15 STRL SS (BLADE)
BNDG COHESIVE 4X5 TAN STRL (GAUZE/BANDAGES/DRESSINGS) IMPLANT
BUR OVAL 6.0 (BURR) ×3 IMPLANT
CANNULA TWIST IN 8.25X7CM (CANNULA) ×4 IMPLANT
CLOSURE WOUND 1/2 X4 (GAUZE/BANDAGES/DRESSINGS)
DECANTER SPIKE VIAL GLASS SM (MISCELLANEOUS) IMPLANT
DRAPE SHOULDER BEACH CHAIR (DRAPES) ×3 IMPLANT
DRAPE U-SHAPE 47X51 STRL (DRAPES) ×6 IMPLANT
DRSG PAD ABDOMINAL 8X10 ST (GAUZE/BANDAGES/DRESSINGS) ×3 IMPLANT
DURAPREP 26ML APPLICATOR (WOUND CARE) ×3 IMPLANT
ELECT REM PT RETURN 9FT ADLT (ELECTROSURGICAL) ×3
ELECTRODE REM PT RTRN 9FT ADLT (ELECTROSURGICAL) ×1 IMPLANT
GAUZE SPONGE 4X4 12PLY STRL (GAUZE/BANDAGES/DRESSINGS) ×3 IMPLANT
GAUZE XEROFORM 1X8 LF (GAUZE/BANDAGES/DRESSINGS) ×3 IMPLANT
GLOVE BIO SURGEON STRL SZ 6.5 (GLOVE) ×2 IMPLANT
GLOVE BIO SURGEON STRL SZ7 (GLOVE) ×2 IMPLANT
GLOVE BIO SURGEONS STRL SZ 6.5 (GLOVE) ×2
GLOVE BIOGEL PI IND STRL 7.0 (GLOVE) IMPLANT
GLOVE BIOGEL PI IND STRL 7.5 (GLOVE) ×1 IMPLANT
GLOVE BIOGEL PI INDICATOR 7.0 (GLOVE)
GLOVE BIOGEL PI INDICATOR 7.5 (GLOVE) ×2
GLOVE SS BIOGEL STRL SZ 7.5 (GLOVE) ×1 IMPLANT
GLOVE SUPERSENSE BIOGEL SZ 7.5 (GLOVE) ×2
GOWN STRL REUS W/ TWL LRG LVL3 (GOWN DISPOSABLE) ×3 IMPLANT
GOWN STRL REUS W/TWL LRG LVL3 (GOWN DISPOSABLE) ×12
MANIFOLD NEPTUNE II (INSTRUMENTS) ×3 IMPLANT
NDL 1/2 CIR CATGUT .05X1.09 (NEEDLE) IMPLANT
NDL SAFETY ECLIPSE 18X1.5 (NEEDLE) ×1 IMPLANT
NDL SCORPION MULTI FIRE (NEEDLE) IMPLANT
NDL SUT 6 .5 CRC .975X.05 MAYO (NEEDLE) IMPLANT
NEEDLE 1/2 CIR CATGUT .05X1.09 (NEEDLE) IMPLANT
NEEDLE HYPO 18GX1.5 SHARP (NEEDLE) ×3
NEEDLE MAYO TAPER (NEEDLE)
NEEDLE SCORPION MULTI FIRE (NEEDLE) ×3 IMPLANT
PACK ARTHROSCOPY DSU (CUSTOM PROCEDURE TRAY) ×3 IMPLANT
PACK BASIN DAY SURGERY FS (CUSTOM PROCEDURE TRAY) ×3 IMPLANT
PAD ALCOHOL SWAB (MISCELLANEOUS) ×6 IMPLANT
PENCIL BUTTON HOLSTER BLD 10FT (ELECTRODE) IMPLANT
SET ARTHROSCOPY TUBING (MISCELLANEOUS) ×3
SET ARTHROSCOPY TUBING LN (MISCELLANEOUS) ×1 IMPLANT
SHEET MEDIUM DRAPE 40X70 STRL (DRAPES) IMPLANT
SLEEVE SCD COMPRESS KNEE MED (MISCELLANEOUS) IMPLANT
SLING ARM IMMOBILIZER MED (SOFTGOODS) IMPLANT
SLING ARM LRG ADULT FOAM STRAP (SOFTGOODS) IMPLANT
SLING ARM MED ADULT FOAM STRAP (SOFTGOODS) IMPLANT
SLING ARM XL FOAM STRAP (SOFTGOODS) IMPLANT
SLING ULTRA III MED (ORTHOPEDIC SUPPLIES) IMPLANT
SPONGE LAP 4X18 X RAY DECT (DISPOSABLE) IMPLANT
STRIP CLOSURE SKIN 1/2X4 (GAUZE/BANDAGES/DRESSINGS) IMPLANT
SUCTION FRAZIER TIP 10 FR DISP (SUCTIONS) IMPLANT
SUT ETHILON 3 0 PS 1 (SUTURE) ×3 IMPLANT
SUT FIBERWIRE #2 38 T-5 BLUE (SUTURE) ×6
SUT PDS AB 2-0 CT2 27 (SUTURE) IMPLANT
SUT PROLENE 3 0 PS 2 (SUTURE) IMPLANT
SUT TIGER TAPE 7 IN WHITE (SUTURE) IMPLANT
SUT VIC AB 0 SH 27 (SUTURE) IMPLANT
SUT VIC AB 2-0 PS2 27 (SUTURE) IMPLANT
SUT VIC AB 2-0 SH 27 (SUTURE)
SUT VIC AB 2-0 SH 27XBRD (SUTURE) IMPLANT
SUTURE FIBERWR #2 38 T-5 BLUE (SUTURE) IMPLANT
SYR 5ML LL (SYRINGE) ×3 IMPLANT
SYR BULB 3OZ (MISCELLANEOUS) IMPLANT
TAPE FIBER 2MM 7IN #2 BLUE (SUTURE) IMPLANT
TAPE HYPAFIX 6 X30' (GAUZE/BANDAGES/DRESSINGS)
TAPE HYPAFIX 6X30 (GAUZE/BANDAGES/DRESSINGS) IMPLANT
TAPE STRIPS DRAPE STRL (GAUZE/BANDAGES/DRESSINGS) ×3 IMPLANT
TOWEL OR 17X24 6PK STRL BLUE (TOWEL DISPOSABLE) ×3 IMPLANT
TUBE CONNECTING 20'X1/4 (TUBING)
TUBE CONNECTING 20X1/4 (TUBING) IMPLANT
WAND STAR VAC 90 (SURGICAL WAND) ×3 IMPLANT
WATER STERILE IRR 1000ML POUR (IV SOLUTION) ×3 IMPLANT

## 2015-02-26 NOTE — Progress Notes (Signed)
Assisted Dr. Hodierne with right, ultrasound guided, interscalene  block. Side rails up, monitors on throughout procedure. See vital signs in flow sheet. Tolerated Procedure well. 

## 2015-02-26 NOTE — Anesthesia Procedure Notes (Addendum)
Anesthesia Regional Block:  Interscalene brachial plexus block  Pre-Anesthetic Checklist: ,, timeout performed, Correct Patient, Correct Site, Correct Laterality, Correct Procedure, Correct Position, site marked, Risks and benefits discussed,  Surgical consent,  Pre-op evaluation,  At surgeon's request and post-op pain management  Laterality: Right  Prep: chloraprep       Needles:  Injection technique: Single-shot  Needle Type: Echogenic Stimulator Needle     Needle Length: 5cm 5 cm Needle Gauge: 22 and 22 G    Additional Needles:  Procedures: ultrasound guided (picture in chart) and nerve stimulator Interscalene brachial plexus block  Nerve Stimulator or Paresthesia:  Response: biceps flexion, 0.45 mA,   Additional Responses:   Narrative:  Start time: 02/26/2015 11:51 AM End time: 02/26/2015 12:00 PM Injection made incrementally with aspirations every 5 mL.  Performed by: Personally  Anesthesiologist: HODIERNE, ADAM  Additional Notes: Functioning IV was confirmed and monitors were applied.  A 46mm 22ga Arrow echogenic stimulator needle was used. Sterile prep and drape,hand hygiene and sterile gloves were used.  Negative aspiration and negative test dose prior to incremental administration of local anesthetic. The patient tolerated the procedure well.  Ultrasound guidance: relevent anatomy identified, needle position confirmed, local anesthetic spread visualized around nerve(s), vascular puncture avoided.  Image printed for medical record.    Procedure Name: Intubation Date/Time: 02/26/2015 12:23 PM Performed by: Halen Mossbarger D Pre-anesthesia Checklist: Patient identified, Emergency Drugs available, Suction available and Patient being monitored Patient Re-evaluated:Patient Re-evaluated prior to inductionOxygen Delivery Method: Circle System Utilized Preoxygenation: Pre-oxygenation with 100% oxygen Intubation Type: IV induction Ventilation: Mask ventilation without  difficulty Laryngoscope Size: Mac and 3 Grade View: Grade I Tube type: Oral Tube size: 7.0 mm Number of attempts: 1 Airway Equipment and Method: Stylet and Oral airway Placement Confirmation: ETT inserted through vocal cords under direct vision,  positive ETCO2 and breath sounds checked- equal and bilateral Secured at: 21 cm Tube secured with: Tape Dental Injury: Teeth and Oropharynx as per pre-operative assessment

## 2015-02-26 NOTE — H&P (View-Only) (Signed)
Lisa Moody is an 69 y.o. female.   Chief Complaint: right shoulder rotator cuff tear HPI: Lisa Moody is a 69 year old seen for follow-up evaluation from her persistent right shoulder pain. She underwent an MRI which has shown a complete rotator cuff tear.  We had reinjected her shoulder last week which helped temporarily but her pain has persistent.  She has pain with overhead use and activities, relieved by rest.    Past Medical History  Diagnosis Date  . Benign neoplasm of colon   . Esophagitis, unspecified   . Other and unspecified hyperlipidemia   . Other constipation   . Diverticulosis of colon (without mention of hemorrhage)   . Personal history of other mental disorder   . Other and unspecified hyperlipidemia   . Pulmonary embolism     2006  . Palpitations   . Esophageal reflux     few times per month not routine  . Headache(784.0)   . Arthritis   . Endometriosis     reason for hysterectomy  . Osteopenia   . Complication of anesthesia     anectine - caused her to have difficulty waking up and was on vent for few hours post op  . PONV (postoperative nausea and vomiting)     Past Surgical History  Procedure Laterality Date  . Partial hysterectomy    . Appendectomy    . Tubal ligation    . Lumbar laminectomy    . Abdominoplasty    . Total knee arthroplasty    . Abdominal hysterectomy    . Joint replacement      knee replacement  . Liposuction    . Cardiac catheterization      09/06/10: normal coronaries and LV function (saw Dr. Rollene Fare in 2011)  . Lumbar fusion  March 2014    Family History  Problem Relation Age of Onset  . Tics Father   . Lung cancer Father   . Breast cancer Mother   . Diabetes Paternal Grandmother   . Colon cancer Neg Hx   . Esophageal cancer Neg Hx   . Rectal cancer Neg Hx   . Stomach cancer Neg Hx    Social History:  reports that she has never smoked. She has never used smokeless tobacco. She reports that she drinks about 1.8 oz of  alcohol per week. She reports that she does not use illicit drugs.  Allergies:  Allergies  Allergen Reactions  . Succinylcholine     Respiratory suppression   . Multi Vitamin-Fluoride [Multi Vita-Bets-Fluoride] Nausea Only    No current facility-administered medications for this encounter.  Current outpatient prescriptions:  .  ibuprofen (ADVIL,MOTRIN) 200 MG tablet, Take 800 mg by mouth every 6 (six) hours as needed for pain., Disp: , Rfl:  .  pantoprazole (PROTONIX) 20 MG tablet, Take 20 mg by mouth daily as needed. Acid reflux., Disp: , Rfl:  .  ranitidine (ZANTAC) 150 MG tablet, Take 1 tablet (150 mg total) by mouth 2 (two) times daily., Disp: 60 tablet, Rfl: 2  No results found for this or any previous visit (from the past 47 hour(s)). No results found.  Review of Systems  Constitutional: Negative.   HENT: Negative.   Eyes: Negative.   Respiratory: Negative.   Cardiovascular: Negative.   Gastrointestinal: Negative.   Genitourinary: Negative.   Musculoskeletal: Positive for joint pain.       Right shoulder pain  Skin: Negative.   Neurological: Negative.   Endo/Heme/Allergies: Negative.   Psychiatric/Behavioral: Negative.  Blood pressure 130/85, height 5\' 3"  (1.6 m), weight 70.308 kg (155 lb). Physical Exam  Constitutional: She is oriented to person, place, and time. She appears well-developed and well-nourished.  HENT:  Head: Normocephalic and atraumatic.  Mouth/Throat: Oropharynx is clear and moist.  Eyes: Conjunctivae are normal. Pupils are equal, round, and reactive to light.  Neck: Neck supple.  Cardiovascular: Normal rate.   Respiratory: Effort normal.  GI: Soft.  Genitourinary:  Not pertinent to current symptomatology therefore not examined.  Musculoskeletal:  Examination of her right shoulder reveals pain with overhead use and positive impingement.  She has weakness on rotator cuff stressing.  No instability and full range of motion.  Examination of her  left shoulder reveals full range of motion without pain, swelling, weakness or instability. Vascular exam pulses 2+ and symmetric.    Neurological: She is alert and oriented to person, place, and time.  Skin: Skin is warm and dry.  Psychiatric: She has a normal mood and affect.     Assessment Principal Problem:   Traumatic rotator cuff tear right shoulder Active Problems:   GASTROESOPHAGEAL REFLUX DISEASE, CHRONIC   PULMONARY EMBOLISM, HX OF   PONV (postoperative nausea and vomiting)   Plan I have talked to her and her husband about this in detail.  I would recommend, with these findings and her significant persistent pain, that we proceed with right shoulder arthroscopy with rotator cuff repair.  Risks, complications and benefits of the surgery have been described to them in detail and they understand this completely.    Linda Hedges 02/15/2015, 4:47 PM

## 2015-02-26 NOTE — Discharge Instructions (Signed)
°  Post Anesthesia Home Care Instructions ° °Activity: °Get plenty of rest for the remainder of the day. A responsible adult should stay with you for 24 hours following the procedure.  °For the next 24 hours, DO NOT: °-Drive a car °-Operate machinery °-Drink alcoholic beverages °-Take any medication unless instructed by your physician °-Make any legal decisions or sign important papers. ° °Meals: °Start with liquid foods such as gelatin or soup. Progress to regular foods as tolerated. Avoid greasy, spicy, heavy foods. If nausea and/or vomiting occur, drink only clear liquids until the nausea and/or vomiting subsides. Call your physician if vomiting continues. ° °Special Instructions/Symptoms: °Your throat may feel dry or sore from the anesthesia or the breathing tube placed in your throat during surgery. If this causes discomfort, gargle with warm salt water. The discomfort should disappear within 24 hours. ° °If you had a scopolamine patch placed behind your ear for the management of post- operative nausea and/or vomiting: ° °1. The medication in the patch is effective for 72 hours, after which it should be removed.  Wrap patch in a tissue and discard in the trash. Wash hands thoroughly with soap and water. °2. You may remove the patch earlier than 72 hours if you experience unpleasant side effects which may include dry mouth, dizziness or visual disturbances. °3. Avoid touching the patch. Wash your hands with soap and water after contact with the patch. °  °Regional Anesthesia Blocks ° °1. Numbness or the inability to move the "blocked" extremity may last from 3-48 hours after placement. The length of time depends on the medication injected and your individual response to the medication. If the numbness is not going away after 48 hours, call your surgeon. ° °2. The extremity that is blocked will need to be protected until the numbness is gone and the  Strength has returned. Because you cannot feel it, you will need  to take extra care to avoid injury. Because it may be weak, you may have difficulty moving it or using it. You may not know what position it is in without looking at it while the block is in effect. ° °3. For blocks in the legs and feet, returning to weight bearing and walking needs to be done carefully. You will need to wait until the numbness is entirely gone and the strength has returned. You should be able to move your leg and foot normally before you try and bear weight or walk. You will need someone to be with you when you first try to ensure you do not fall and possibly risk injury. ° °4. Bruising and tenderness at the needle site are common side effects and will resolve in a few days. ° °5. Persistent numbness or new problems with movement should be communicated to the surgeon or the [facility] ([phone])/ [facility] ([phone]). °

## 2015-02-26 NOTE — Anesthesia Postprocedure Evaluation (Signed)
Anesthesia Post Note  Patient: Lisa Moody  Procedure(s) Performed: Procedure(s) (LRB): RIGHT SHOULDER ARTHROSCOPY DEBRIDEMENT WITH SUBACROMIAL DECOMPRESSION, , ROTATOR CUFF REPAIR  (Right)  Anesthesia type: general  Patient location: PACU  Post pain: Pain level controlled  Post assessment: Patient's Cardiovascular Status Stable  Last Vitals:  Filed Vitals:   02/26/15 1430  BP: 141/78  Pulse: 107  Temp:   Resp: 17    Post vital signs: Reviewed and stable  Level of consciousness: sedated  Complications: No apparent anesthesia complications

## 2015-02-26 NOTE — Interval H&P Note (Signed)
History and Physical Interval Note:  02/26/2015 12:11 PM  Lisa Moody  has presented today for surgery, with the diagnosis of other articular cartilage disorders, right shoulder, Primary osteoarthitis, right shoulder, buritis of right shoulder, complete rotator cuff tear or rupture  The various methods of treatment have been discussed with the patient and family. After consideration of risks, benefits and other options for treatment, the patient has consented to  Procedure(s): RIGHT SHOULDER ARTHROSCOPY DEBRIDEMENT WITH SUBACROMIAL DECOMPRESSION, DISTAL CLAVICLE EXCISION, ROTATOR CUFF REPAIR  (Right) as a surgical intervention .  The patient's history has been reviewed, patient examined, no change in status, stable for surgery.  I have reviewed the patient's chart and labs.  Questions were answered to the patient's satisfaction.     Elsie Saas A

## 2015-02-26 NOTE — Transfer of Care (Signed)
Immediate Anesthesia Transfer of Care Note  Patient: Lisa Moody  Procedure(s) Performed: Procedure(s): RIGHT SHOULDER ARTHROSCOPY DEBRIDEMENT WITH SUBACROMIAL DECOMPRESSION, , ROTATOR CUFF REPAIR  (Right)  Patient Location: PACU  Anesthesia Type:GA combined with regional for post-op pain  Level of Consciousness: awake, alert , oriented and patient cooperative  Airway & Oxygen Therapy: Patient Spontanous Breathing and Patient connected to face mask oxygen  Post-op Assessment: Report given to RN and Post -op Vital signs reviewed and stable  Post vital signs: Reviewed and stable  Last Vitals:  Filed Vitals:   02/26/15 1201  BP:   Pulse: 93  Temp:   Resp: 22    Complications: No apparent anesthesia complications

## 2015-02-26 NOTE — Anesthesia Preprocedure Evaluation (Signed)
Anesthesia Evaluation  Patient identified by MRN, date of birth, ID band Patient awake    Reviewed: Allergy & Precautions, NPO status , Patient's Chart, lab work & pertinent test results  History of Anesthesia Complications (+) PONV and PSEUDOCHOLINESTERASE DEFICIENCY  Airway Mallampati: II   Neck ROM: full    Dental   Pulmonary neg pulmonary ROS,  breath sounds clear to auscultation        Cardiovascular Rhythm:regular Rate:Normal     Neuro/Psych  Headaches,    GI/Hepatic GERD-  ,  Endo/Other    Renal/GU      Musculoskeletal  (+) Arthritis -,   Abdominal   Peds  Hematology   Anesthesia Other Findings   Reproductive/Obstetrics                             Anesthesia Physical Anesthesia Plan  ASA: II  Anesthesia Plan: General and Regional   Post-op Pain Management: MAC Combined w/ Regional for Post-op pain   Induction: Intravenous  Airway Management Planned: Oral ETT  Additional Equipment:   Intra-op Plan:   Post-operative Plan: Extubation in OR  Informed Consent: I have reviewed the patients History and Physical, chart, labs and discussed the procedure including the risks, benefits and alternatives for the proposed anesthesia with the patient or authorized representative who has indicated his/her understanding and acceptance.     Plan Discussed with: CRNA, Anesthesiologist and Surgeon  Anesthesia Plan Comments:         Anesthesia Quick Evaluation

## 2015-03-01 ENCOUNTER — Encounter (HOSPITAL_BASED_OUTPATIENT_CLINIC_OR_DEPARTMENT_OTHER): Payer: Self-pay | Admitting: Orthopedic Surgery

## 2015-03-01 NOTE — Op Note (Signed)
NAMEANNALEAH, Lisa Moody               ACCOUNT NO.:  [id number]  MEDICAL RECORD NO.:  [id number]  LOCATION:                                 FACILITY:  PHYSICIAN:  Shanard Treto A. Noemi Moody, M.D. DATE OF BIRTH:  10/15/1939  DATE OF PROCEDURE:  02/26/2015 DATE OF DISCHARGE:  02/26/2015                              OPERATIVE REPORT   PREOPERATIVE DIAGNOSES: 1. Right shoulder acute traumatic rotator cuff tear. 2. Right shoulder acute traumatic partial labrum tear. 3. Right shoulder chronic nontraumatic impingement.  POSTOPERATIVE DIAGNOSES: 1. Right shoulder acute traumatic rotator cuff tear. 2. Right shoulder acute traumatic partial labrum tear. 3. Right shoulder chronic nontraumatic impingement.  PROCEDURES: 1. Right shoulder examination under anesthesia followed by     arthroscopically-assisted rotator cuff repair using Arthrex     SwiveLock anchor x1 with fiber tape and FiberWire x2. 2. Right shoulder partial labrum tear debridement. 3. Right shoulder subacromial decompression.  SURGEON:  Audree Camel. Noemi Moody, M.D.  ASSISTANT:  Kirstin Shepperson, PA-C.  ANESTHESIA:  General.  OPERATIVE TIME:  1 hour.  COMPLICATIONS:  None.  INDICATION FOR PROCEDURE:  Lisa Moody is a 69 year old woman who injured her right shoulder in a fall 3 months ago.  Exam and MRI has revealed a large rotator cuff tear, partial labrum tear with impingement.  She has failed conservative care and is now to undergo arthroscopy.  DESCRIPTION OF PROCEDURE:  Lisa Moody was brought into the operating room on February 26, 2015, after an interscalene block was placed in the holding room by Anesthesia.  She was placed on the operative table in supine position.  She received antibiotics preoperatively for prophylaxis.  After being placed under general anesthesia, her right shoulder was examined.  She had full range of motion on her shoulder with stable ligamentous exam.  She was then placed in a beach-chair position and her  shoulder and arm were prepped using sterile DuraPrep and draped using sterile technique.  Time-out procedure was called and the correct right shoulder identified.  Initially, through a posterior arthroscopic portal, the arthroscope with a pump attached was placed into an anterior portal and arthroscopic probe was placed.  On initial inspection, the articular cartilage in the glenohumeral joint was found to be intact.  She had tearing of the anterior, superior, and posterior labrum 25-30%, which was debrided.  The anterior-inferior labrum and anterior-inferior glenohumeral ligament complex were intact.  Biceps tendon anchor was slightly hypermobile, but was otherwise well attached. The biceps tendon was intact.  The rotator cuff showed a large complete tear of the supraspinatus and the infraspinatus.  The rest of the rotator cuff was intact.  Inferior capsular recess was free of pathology.  Subacromial space was entered and a lateral arthroscopic portal was made.  A large amount of bursitis was resected.  Impingement was noted as the acromion was digging into the rotator cuff tear and the subacromial decompression was carried out removing 6-8 mm of the undersurface of the anterior, anterolateral, and anteromedial acromion and CA ligament release carried out as well.  The [facility] joint was not pathologic and thus was not resected.  At this point, through an accessory lateral portal, the rotator cuff  tear was repaired primarily with 1 fiber tape in a mattress suture technique and then a set of mattress FiberWires, one posterior and one anterior.  All of the sutures were then placed in a SwiveLock, which was deployed in the lateral aspect of the greater tuberosity, thus holding the rotator cuff back down in firm and tight position with an excellent anatomic fixation. The shoulder could then be brought through full range of motion with no impingement on the repair.  At this point, it was felt that  all pathology have been satisfactorily addressed.  The instruments were removed.  Portals were closed with 3-0 nylon suture.  Sterile dressings and a sling applied, and the patient was awakened and taken to the recovery room in stable condition.  FOLLOWUP CARE:  Lisa Moody will be followed as an outpatient, on OxyContin and Valium with early physical therapy.  She will be seen back in the office in a week for sutures out and follow up.     Lisa Moody, M.D.     RAW/MEDQ  D:  02/26/2015  T:  02/27/2015  Job:  [id number]

## 2015-05-17 ENCOUNTER — Other Ambulatory Visit: Payer: Self-pay

## 2015-07-13 ENCOUNTER — Ambulatory Visit (INDEPENDENT_AMBULATORY_CARE_PROVIDER_SITE_OTHER): Payer: 59 | Admitting: Podiatry

## 2015-07-13 ENCOUNTER — Ambulatory Visit (INDEPENDENT_AMBULATORY_CARE_PROVIDER_SITE_OTHER): Payer: 59

## 2015-07-13 DIAGNOSIS — M2040 Other hammer toe(s) (acquired), unspecified foot: Secondary | ICD-10-CM | POA: Diagnosis not present

## 2015-07-13 DIAGNOSIS — M2012 Hallux valgus (acquired), left foot: Secondary | ICD-10-CM

## 2015-07-13 DIAGNOSIS — M21629 Bunionette of unspecified foot: Secondary | ICD-10-CM

## 2015-07-13 DIAGNOSIS — M21611 Bunion of right foot: Secondary | ICD-10-CM

## 2015-07-13 DIAGNOSIS — M2011 Hallux valgus (acquired), right foot: Secondary | ICD-10-CM

## 2015-07-13 DIAGNOSIS — M205X9 Other deformities of toe(s) (acquired), unspecified foot: Secondary | ICD-10-CM

## 2015-07-13 NOTE — Progress Notes (Signed)
Subjective:     Patient ID: Lisa Moody, female   DOB: 1946/11/10, 69 y.o.   MRN: [id number]  HPI patient presents with a long-term history of structural bunion deformity right over left that has become increasingly symptomatic over the last several years. Patient has tried wider shoes soaks without relief of symptoms and is having increased difficulty wearing shoe gear with any degree of comfort   Review of Systems  All other systems reviewed and are negative.      Objective:   Physical Exam  Constitutional: She is oriented to person, place, and time.  Cardiovascular: Intact distal pulses.   Musculoskeletal: Normal range of motion.  Neurological: She is oriented to person, place, and time.  Skin: Skin is warm.  Nursing note and vitals reviewed.  neurovascular status found to be intact with muscle strength adequate range of motion within normal limits. Patient was noted to have negative Homans sign and no history of DVT and presents with hyperostosis medial aspect first metatarsal head foot with redness around the joint and deviation of the hallux against the second toe with distal lateral movement of the second toe right foot and prominent fifth metatarsal head right with redness. Mild deformity on the left but not to the same degree. 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 Today I have recommended structural correction and patient is motivated for surgery and is recommended for Austin-type osteotomy of the right first metatarsal along with metatarsal osteotomy of the fifth right and distal arthroplasty digit 2 right to derotate the digit. We reviewed what would be done and  she schedules for the end of September and will reappoint mid-September for consultation to further review. At that time I will discuss her history of pulmonary embolism and we will decide on prophylaxis.

## 2015-07-13 NOTE — Progress Notes (Signed)
   Subjective:    Patient ID: Lisa Moody, female    DOB: Aug 12, 1946, 69 y.o.   MRN: [id number]  HPI  Pt presents with bilateral bunions right over left painful and has worsened in pain over time, also c/o ball of foot pain on right foot  Review of Systems  All other systems reviewed and are negative.      Objective:   Physical Exam        Assessment & Plan:

## 2015-07-21 ENCOUNTER — Telehealth: Payer: Self-pay | Admitting: *Deleted

## 2015-07-21 NOTE — Telephone Encounter (Signed)
Pt states she is scheduled of surgery 08/17/2015 and is hosting a wedding party at her house on 08/29/2015, and she felt she wanted to cancel the surgery.  Pt was transferred to [facility].

## 2015-07-21 NOTE — Telephone Encounter (Signed)
"  I need you to call me.  I need to reschedule a surgery that has been planned for me.  Thank you."

## 2015-07-23 NOTE — Telephone Encounter (Signed)
I attempted to return patient's call.  I left her a message to call me back on Wednesday.

## 2015-07-28 NOTE — Telephone Encounter (Signed)
I'm returning your call.  "Yes, I was told I needed to call you about canceling my surgery.  I can't find a good time to do it at this time.  I also need you to cancel my pre-op appointment.  I'll call back later to reschedule when I can find a good time."  Okay, I take care of that.    I called and left a message for Renee at the surgical center to cancel patient's surgery for 08/17/2015.

## 2015-08-04 ENCOUNTER — Ambulatory Visit: Payer: 59 | Admitting: Podiatry

## 2015-08-23 ENCOUNTER — Other Ambulatory Visit: Payer: Self-pay

## 2015-11-01 ENCOUNTER — Other Ambulatory Visit: Payer: Self-pay | Admitting: *Deleted

## 2015-11-01 MED ORDER — PANTOPRAZOLE SODIUM 20 MG PO TBEC: 20.0000 mg | DELAYED_RELEASE_TABLET | Freq: Every day | ORAL | Status: DC | PRN

## 2015-11-01 NOTE — Telephone Encounter (Signed)
#  10 of Pantoprazole given only.  Instructed to get future refills from PCP.

## 2016-07-20 ENCOUNTER — Other Ambulatory Visit: Payer: Self-pay | Admitting: Orthopedic Surgery

## 2016-07-20 DIAGNOSIS — M25511 Pain in right shoulder: Secondary | ICD-10-CM

## 2016-08-03 ENCOUNTER — Ambulatory Visit
Admission: RE | Admit: 2016-08-03 | Discharge: 2016-08-03 | Disposition: A | Discharge status: Home / Self Care | Payer: 59 | Source: Ambulatory Visit | Attending: Orthopedic Surgery | Admitting: Orthopedic Surgery

## 2016-08-03 DIAGNOSIS — M25511 Pain in right shoulder: Secondary | ICD-10-CM

## 2016-08-03 MED ORDER — IOPAMIDOL (ISOVUE-M 200) INJECTION 41%: 1.0000 mL | Freq: Once | INTRAMUSCULAR | Status: DC

## 2016-08-03 MED ORDER — METHYLPREDNISOLONE ACETATE 40 MG/ML INJ SUSP (RADIOLOG
120.0000 mg | Freq: Once | INTRAMUSCULAR | Status: AC
  Administered 2016-08-03: 120 mg via INTRA_ARTICULAR

## 2016-08-03 MED ORDER — IOPAMIDOL (ISOVUE-M 200) INJECTION 41%
15.0000 mL | Freq: Once | INTRAMUSCULAR | Status: AC
  Administered 2016-08-03: 15 mL via INTRA_ARTICULAR

## 2016-08-03 MED ORDER — METHYLPREDNISOLONE ACETATE 40 MG/ML INJ SUSP (RADIOLOG: 120.0000 mg | Freq: Once | INTRAMUSCULAR | Status: DC

## 2016-08-11 DIAGNOSIS — M25511 Pain in right shoulder: Secondary | ICD-10-CM

## 2016-08-11 DIAGNOSIS — G8929 Other chronic pain: Secondary | ICD-10-CM | POA: Insufficient documentation

## 2016-08-13 DIAGNOSIS — M75121 Complete rotator cuff tear or rupture of right shoulder, not specified as traumatic: Secondary | ICD-10-CM | POA: Insufficient documentation

## 2016-08-14 ENCOUNTER — Telehealth: Payer: Self-pay | Admitting: Obstetrics and Gynecology

## 2016-08-14 ENCOUNTER — Other Ambulatory Visit: Payer: Self-pay | Admitting: Obstetrics and Gynecology

## 2016-08-14 DIAGNOSIS — M8580 Other specified disorders of bone density and structure, unspecified site: Secondary | ICD-10-CM

## 2016-08-14 DIAGNOSIS — Z1231 Encounter for screening mammogram for malignant neoplasm of breast: Secondary | ICD-10-CM

## 2016-08-14 NOTE — Telephone Encounter (Signed)
Patient called and requested an order for a bone density be sent to [facility]. She has a mammogram on 08/23/16 and hopes to have them both at the same time.

## 2016-08-14 NOTE — Telephone Encounter (Signed)
Spoke with patient. Advised patient order placed for Bone density to be done at [facility].     Routing to provider for final review. Patient is agreeable to disposition. Will close encounter.

## 2016-08-21 ENCOUNTER — Ambulatory Visit (INDEPENDENT_AMBULATORY_CARE_PROVIDER_SITE_OTHER): Payer: 59 | Admitting: Neurology

## 2016-08-21 ENCOUNTER — Encounter: Payer: Self-pay | Admitting: Neurology

## 2016-08-21 VITALS — BP 112/68 | HR 78 | Resp 16 | Ht 62.0 in | Wt 174.0 lb

## 2016-08-21 DIAGNOSIS — R0683 Snoring: Secondary | ICD-10-CM | POA: Diagnosis not present

## 2016-08-21 DIAGNOSIS — R51 Headache: Secondary | ICD-10-CM | POA: Diagnosis not present

## 2016-08-21 DIAGNOSIS — G4719 Other hypersomnia: Secondary | ICD-10-CM | POA: Diagnosis not present

## 2016-08-21 DIAGNOSIS — R635 Abnormal weight gain: Secondary | ICD-10-CM

## 2016-08-21 DIAGNOSIS — E669 Obesity, unspecified: Secondary | ICD-10-CM

## 2016-08-21 DIAGNOSIS — G4710 Hypersomnia, unspecified: Secondary | ICD-10-CM

## 2016-08-21 DIAGNOSIS — R519 Headache, unspecified: Secondary | ICD-10-CM

## 2016-08-21 NOTE — Patient Instructions (Signed)

## 2016-08-21 NOTE — Progress Notes (Signed)
Subjective:    Patient ID: Lisa Moody is a 70 y.o. female.  HPI     Star Age, MD, PhD [facility] [address], [address].O. [place], [place] [address]  Dear Dr. Joylene Draft,  I saw your patient, Lisa Moody, upon your kind request in my neurologic clinic today for initial consultation of her sleep disorder, in particular, concern for underlying obstructive sleep apnea. The patient is unaccompanied today. As you know, Lisa Moody is a 70 year old right-handed woman with an underlying medical history of history of PE in 2008, hyperlipidemia, diverticulitis, recurrent headaches, arthritis, low back pain, status post lumbar laminectomy, status post left knee replacement surgery, s/p R rotator cuff surgery in 4/16, and history of obesity, who reports snoring and excessive daytime somnolence, as well as morning headaches and nocturia. Her Epworth sleepiness score is 10 out of 24 today, her fatigue score is 23 out of 63. I reviewed your office note from 08/02/2016, which you kindly included. Her BP at your office was elevated for her, borderline in general terms, but she recalls having had a HA that day. She will need rotator cuff surgery on the R. Had prior surgery.  Bedtime is between 10 and 10:30 PM. Wakeup time is around 7:30, on weekends or vacation around 8:30. She does like to nap, tries to limit her nap to less than 1 hour. She denies restless leg symptoms. She is not aware of any family history of OSA. She is a nonsmoker, drinks alcohol socially, mainly on weekends and drinks caffeine in the form of coffee, 2 cups per day. She has 3 grown children and 11 grandchildren. She does not drink sodas. She is trying to lose weight. She has joined [employer].  Her Past Medical History Is Significant For: Past Medical History:  Diagnosis Date  . Arthritis   . Benign neoplasm of colon   . Complication of anesthesia    anectine - caused her to have difficulty  waking up and was on vent for few hours post op  . Diverticulosis of colon (without mention of hemorrhage)   . Endometriosis    reason for hysterectomy  . Esophageal reflux    few times per month not routine  . Esophagitis, unspecified   . Headache(784.0)   . Osteopenia   . Other and unspecified hyperlipidemia   . Other and unspecified hyperlipidemia   . Other constipation   . Palpitations   . Personal history of other mental disorder   . PONV (postoperative nausea and vomiting)   . Pulmonary embolism ([place])    2006    Her Past Surgical History Is Significant For: Past Surgical History:  Procedure Laterality Date  . ABDOMINAL HYSTERECTOMY    . ABDOMINOPLASTY    . APPENDECTOMY    . CARDIAC CATHETERIZATION     09/06/10: normal coronaries and LV function (saw Dr. Rollene Fare in 2011)  . COLONOSCOPY    . JOINT REPLACEMENT     knee replacement  . LIPOSUCTION    . LUMBAR FUSION  March 2014  . LUMBAR LAMINECTOMY    . PARTIAL HYSTERECTOMY    . SHOULDER ARTHROSCOPY WITH ROTATOR CUFF REPAIR AND SUBACROMIAL DECOMPRESSION Right 02/26/2015   Procedure: RIGHT SHOULDER ARTHROSCOPY DEBRIDEMENT WITH SUBACROMIAL DECOMPRESSION, , ROTATOR CUFF REPAIR ;  Surgeon: Elsie Saas, MD;  Location: [facility];  Service: Orthopedics;  Laterality: Right;  . TOTAL KNEE ARTHROPLASTY  2010   left  . TUBAL LIGATION  Her Family History Is Significant For: Family History  Problem Relation Age of Onset  . Tics Father   . Lung cancer Father   . Breast cancer Mother   . Diabetes Mother   . Diabetes Paternal Grandmother   . Colon cancer Neg Hx   . Esophageal cancer Neg Hx   . Rectal cancer Neg Hx   . Stomach cancer Neg Hx     Her Social History Is Significant For: Social History   Social History  . Marital status: Married    Spouse name: N/A  . Number of children: 3  . Years of education: college   Occupational History  . retired Unemployed   Social History Main Topics  .  Smoking status: Never Smoker  . Smokeless tobacco: Never Used  . Alcohol use 1.8 oz/week    3 Glasses of wine per week     Comment: socially  . Drug use: No  . Sexual activity: Yes    Partners: Male    Birth control/ protection: Surgical     Comment: TAH   Other Topics Concern  . None   Social History Narrative   Drinks 2 cup coffee a day     Her Allergies Are:  Allergies  Allergen Reactions  . Succinylcholine     Respiratory suppression   . Multi Vitamin-Fluoride [Multivitamins-Fluoride] Nausea Only  :   Her Current Medications Are:  Outpatient Encounter Prescriptions as of 08/21/2016  Medication Sig  . atorvastatin (LIPITOR) 10 MG tablet Take 10 mg by mouth daily.  . pantoprazole (PROTONIX) 20 MG tablet Take 1 tablet (20 mg total) by mouth daily as needed. Acid reflux.  . [DISCONTINUED] acetaminophen (TYLENOL) 500 MG tablet Take 500 mg by mouth every 6 (six) hours as needed.  . [DISCONTINUED] cefdinir (OMNICEF) 300 MG capsule Take 300 mg by mouth 2 (two) times daily.  . [DISCONTINUED] diazepam (VALIUM) 2 MG tablet Take 1 tablet (2 mg total) by mouth every 8 (eight) hours as needed for muscle spasms. (Patient not taking: Reported on 07/13/2015)  . [DISCONTINUED] enoxaparin (LOVENOX) 40 MG/0.4ML injection Inject 0.4 mLs (40 mg total) into the skin daily. (Patient not taking: Reported on 07/13/2015)  . [DISCONTINUED] fluticasone (FLONASE) 50 MCG/ACT nasal spray Place into both nostrils daily.  . [DISCONTINUED] guaiFENesin (MUCINEX) 600 MG 12 hr tablet Take by mouth 2 (two) times daily.  . [DISCONTINUED] ondansetron (ZOFRAN ODT) 4 MG disintegrating tablet 1-2 tablet po q 8 hr prn spasm (Patient not taking: Reported on 07/13/2015)  . [DISCONTINUED] oxyCODONE (ROXICODONE) 5 MG immediate release tablet 1-2 tablets every 4-6 hrs as needed for pain (Patient not taking: Reported on 07/13/2015)   No facility-administered encounter medications on file as of 69/12/2015.   :  Review of  Systems:  Out of a complete 14 point review of systems, all are reviewed and negative with the exception of these symptoms as listed below: Review of Systems  Neurological:       Occasionally has trouble falling asleep, snoring, wakes up feeling tired, morning headaches, daytime tiredness, takes naps.   Epworth Sleepiness Scale 0= would never doze 1= slight chance of dozing 2= moderate chance of dozing 3= high chance of dozing  Sitting and reading:2 Watching TV:1 Sitting inactive in a public place (ex. Theater or meeting):0 As a passenger in a car for an hour without a break:1 Lying down to rest in the afternoon:3 Sitting and talking to someone:0 Sitting quietly after lunch (no alcohol):3 In a car, while  stopped in traffic:0 Total:10   Objective:  Neurologic Exam  Physical Exam Physical Examination:   Vitals:   08/21/16 1412  BP: 112/68  Pulse: 78  Resp: 16   General Examination: The patient is a very pleasant 70 y.o. female in no acute distress. She appears well-developed and well-nourished and well groomed.   HEENT: Normocephalic, atraumatic, pupils are equal, round and reactive to light and accommodation. Funduscopic exam is normal with sharp disc margins noted. Extraocular tracking is good without limitation to gaze excursion or nystagmus noted. Normal smooth pursuit is noted. Hearing is grossly intact. Tympanic membranes are clear bilaterally. Face is symmetric with normal facial animation and normal facial sensation. Speech is clear with no dysarthria noted. There is no hypophonia. There is no lip, neck/head, jaw or voice tremor. Neck is supple with full range of passive and active motion. There are no carotid bruits on auscultation. Oropharynx exam reveals: mild mouth dryness, adequate dental hygiene and moderate airway crowding, due to smaller airway entry, redundant soft palate. Tonsils are small. Uvula is also small.. Mallampati is class II. Tongue protrudes centrally  and palate elevates symmetrically. Neck size is 14 7/8 inches. She has a Mild overbite.  Chest: Clear to auscultation without wheezing, rhonchi or crackles noted.  Heart: S1+S2+0, regular and normal without murmurs, rubs or gallops noted.   Abdomen: Soft, non-tender and non-distended with normal bowel sounds appreciated on auscultation.  Extremities: There is no pitting edema in the distal lower extremities bilaterally. Pedal pulses are intact.  Skin: Warm and dry without trophic changes noted.  Musculoskeletal: exam reveals no obvious joint deformities, tenderness or joint swelling or erythema.   Neurologically:  Mental status: The patient is awake, alert and oriented in all 4 spheres. Her immediate and remote memory, attention, language skills and fund of knowledge are appropriate. There is no evidence of aphasia, agnosia, apraxia or anomia. Speech is clear with normal prosody and enunciation. Thought process is linear. Mood is normal and affect is normal.  Cranial nerves II - XII are as described above under HEENT exam. In addition: shoulder shrug is normal with equal shoulder height noted. Motor exam: Normal bulk, strength and tone is noted. There is no drift, tremor or rebound. Romberg is negative. Reflexes are 2+ throughout. Fine motor skills and coordination: intact with normal finger taps, normal hand movements, normal rapid alternating patting, normal foot taps and normal foot agility.  Cerebellar testing: No dysmetria or intention tremor on finger to nose testing. Heel to shin is unremarkable bilaterally. There is no truncal or gait ataxia.  Sensory exam: intact to light touch, pinprick, vibration, temperature sense in the upper and lower extremities.  Gait, station and balance: She stands easily. No veering to one side is noted. No leaning to one side is noted. Posture is age-appropriate and stance is narrow based. Gait shows normal stride length and pace. No trouble turning. Tandem  walk is unremarkable.         Assessment and Plan:  In summary, KASHYRA BONTEMPO is a very pleasant 70 y.o.-year old female with an underlying medical history of history of PE in 2008, hyperlipidemia, diverticulitis, recurrent headaches, arthritis, low back pain, status post lumbar laminectomy, status post left knee replacement surgery, and history of obesity, whose history and physical exam are concerning for obstructive sleep apnea (OSA). I had a long chat with the patient about my findings and the diagnosis of OSA, its prognosis and treatment options. We talked about medical treatments, surgical interventions  and non-pharmacological approaches. I explained in particular the risks and ramifications of untreated moderate to severe OSA, especially with respect to developing cardiovascular disease down the Road, including congestive heart failure, difficult to treat hypertension, cardiac arrhythmias, or stroke. Even type 2 diabetes has, in part, been linked to untreated OSA. Symptoms of untreated OSA include daytime sleepiness, memory problems, mood irritability and mood disorder such as depression and anxiety, lack of energy, as well as recurrent headaches, especially morning headaches. We talked about trying to maintain a healthy lifestyle in general, as well as the importance of weight control. I encouraged the patient to eat healthy, exercise daily and keep well hydrated, to keep a scheduled bedtime and wake time routine, to not skip any meals and eat healthy snacks in between meals. I advised the patient not to drive when feeling sleepy. I recommended the following at this time: sleep study with potential positive airway pressure titration. (We will score hypopneas at 4% and split the sleep study into diagnostic and treatment portion, if the estimated. 2 hour AHI is >20/h).   I explained the sleep test procedure to the patient and also outlined possible surgical and non-surgical treatment options of OSA,  including the use of a custom-made dental device (which would require a referral to a specialist dentist or oral surgeon), upper airway surgical options, such as pillar implants, radiofrequency surgery, tongue base surgery, and UPPP (which would involve a referral to an ENT surgeon). Rarely, jaw surgery such as mandibular advancement may be considered.  I also explained the CPAP treatment option to the patient, who indicated that she would be willing to try CPAP if the need arises. I explained the importance of being compliant with PAP treatment, not only for insurance purposes but primarily to improve Her symptoms, and for the patient's long term health benefit, including to reduce Her cardiovascular risks. I answered all her questions today and the patient was in agreement. I would like to see her back after the sleep study is completed and encouraged her to call with any interim questions, concerns, problems or updates.   Thank you very much for allowing me to participate in the care of this nice patient. If I can be of any further assistance to you please do not hesitate to call me at [phone].  Sincerely,   Star Age, MD, PhD

## 2016-08-23 ENCOUNTER — Telehealth: Payer: Self-pay | Admitting: *Deleted

## 2016-08-23 ENCOUNTER — Ambulatory Visit
Admission: RE | Admit: 2016-08-23 | Discharge: 2016-08-23 | Disposition: A | Discharge status: Home / Self Care | Payer: 59 | Source: Ambulatory Visit | Attending: Obstetrics and Gynecology | Admitting: Obstetrics and Gynecology

## 2016-08-23 DIAGNOSIS — Z1231 Encounter for screening mammogram for malignant neoplasm of breast: Secondary | ICD-10-CM

## 2016-08-23 NOTE — Telephone Encounter (Signed)
Spoke with patient. Patient states she was at [facility] for scheduled MMG today. Patient states does not have a "order" for Bone density as requested on 08/14/16. Verified order for bone density was placed per telephone encounter conversation dated 08/14/16 as seen below; verified requested orders with [facility]. Patient did not call to schedule the bone density following conversation on 08/14/16. I apologized to patient as there was a misunderstanding. Patient states she thought once the order was sent the bone density would be scheduled. [facility] unable to accommodate working patient in for bone density today following MMG, patient to call and schedule per patient. Patient scheduled for bone density for 08/29/16 at 2:00pm.    Routing to provider for final review. Patient is agreeable to disposition. Will close encounter.              08/14/16 2:43 PM  You routed this conversation to Nunzio Cobbs, MD  Me     08/14/16 2:41 PM  Note    Spoke with patient. Advised patient order placed for Bone density to be done at [facility].     Routing to provider for final review. Patient is agreeable to disposition. Will close encounter.          08/14/16 2:40 PM    You contacted Belva Bertin       08/14/16 2:20 PM  Lavella Hammock Curl routed this conversation to [place]    08/14/16 2:19 PM  Note    Patient called and requested an order for a bone density be sent to [facility]. She has a mammogram on 08/23/16 and hopes to have them both at the same time.

## 2016-08-29 ENCOUNTER — Ambulatory Visit
Admission: RE | Admit: 2016-08-29 | Discharge: 2016-08-29 | Disposition: A | Discharge status: Home / Self Care | Payer: 59 | Source: Ambulatory Visit | Attending: Obstetrics and Gynecology | Admitting: Obstetrics and Gynecology

## 2016-08-29 DIAGNOSIS — M8580 Other specified disorders of bone density and structure, unspecified site: Secondary | ICD-10-CM

## 2016-09-06 ENCOUNTER — Telehealth: Payer: Self-pay | Admitting: Neurology

## 2016-09-06 DIAGNOSIS — R635 Abnormal weight gain: Secondary | ICD-10-CM

## 2016-09-06 DIAGNOSIS — G4710 Hypersomnia, unspecified: Secondary | ICD-10-CM

## 2016-09-06 DIAGNOSIS — R0683 Snoring: Secondary | ICD-10-CM

## 2016-09-06 DIAGNOSIS — G4719 Other hypersomnia: Secondary | ICD-10-CM

## 2016-09-06 DIAGNOSIS — E669 Obesity, unspecified: Secondary | ICD-10-CM

## 2016-09-06 DIAGNOSIS — R519 Headache, unspecified: Secondary | ICD-10-CM

## 2016-09-06 DIAGNOSIS — R51 Headache: Secondary | ICD-10-CM

## 2016-09-06 NOTE — Telephone Encounter (Signed)
[employer] denied [place] however suggested HST which doesn't need prior authorization.  Can I get an order for a HST?

## 2016-09-06 NOTE — Telephone Encounter (Signed)
pls order HST, thx

## 2016-09-06 NOTE — Telephone Encounter (Signed)
Order placed

## 2016-09-14 ENCOUNTER — Telehealth: Payer: Self-pay | Admitting: Obstetrics and Gynecology

## 2016-09-14 NOTE — Telephone Encounter (Signed)
Left voicemail to call and reschedule cancelled appointment.

## 2016-10-02 ENCOUNTER — Ambulatory Visit: Payer: 59 | Admitting: Obstetrics and Gynecology

## 2016-11-22 DIAGNOSIS — M25611 Stiffness of right shoulder, not elsewhere classified: Secondary | ICD-10-CM | POA: Diagnosis not present

## 2016-11-22 DIAGNOSIS — M75121 Complete rotator cuff tear or rupture of right shoulder, not specified as traumatic: Secondary | ICD-10-CM | POA: Diagnosis not present

## 2016-11-22 DIAGNOSIS — M25511 Pain in right shoulder: Secondary | ICD-10-CM | POA: Diagnosis not present

## 2016-11-24 DIAGNOSIS — M75121 Complete rotator cuff tear or rupture of right shoulder, not specified as traumatic: Secondary | ICD-10-CM | POA: Diagnosis not present

## 2016-11-24 DIAGNOSIS — M25511 Pain in right shoulder: Secondary | ICD-10-CM | POA: Diagnosis not present

## 2016-11-24 DIAGNOSIS — M25611 Stiffness of right shoulder, not elsewhere classified: Secondary | ICD-10-CM | POA: Diagnosis not present

## 2016-11-27 DIAGNOSIS — M25611 Stiffness of right shoulder, not elsewhere classified: Secondary | ICD-10-CM | POA: Diagnosis not present

## 2016-11-27 DIAGNOSIS — M25511 Pain in right shoulder: Secondary | ICD-10-CM | POA: Diagnosis not present

## 2016-11-27 DIAGNOSIS — M75121 Complete rotator cuff tear or rupture of right shoulder, not specified as traumatic: Secondary | ICD-10-CM | POA: Diagnosis not present

## 2016-11-30 DIAGNOSIS — M75121 Complete rotator cuff tear or rupture of right shoulder, not specified as traumatic: Secondary | ICD-10-CM | POA: Diagnosis not present

## 2016-11-30 DIAGNOSIS — M25511 Pain in right shoulder: Secondary | ICD-10-CM | POA: Diagnosis not present

## 2016-11-30 DIAGNOSIS — M25611 Stiffness of right shoulder, not elsewhere classified: Secondary | ICD-10-CM | POA: Diagnosis not present

## 2016-12-04 DIAGNOSIS — R531 Weakness: Secondary | ICD-10-CM | POA: Diagnosis not present

## 2016-12-04 DIAGNOSIS — M25611 Stiffness of right shoulder, not elsewhere classified: Secondary | ICD-10-CM | POA: Diagnosis not present

## 2016-12-04 DIAGNOSIS — M25511 Pain in right shoulder: Secondary | ICD-10-CM | POA: Diagnosis not present

## 2016-12-08 DIAGNOSIS — M75121 Complete rotator cuff tear or rupture of right shoulder, not specified as traumatic: Secondary | ICD-10-CM | POA: Diagnosis not present

## 2016-12-08 DIAGNOSIS — M25511 Pain in right shoulder: Secondary | ICD-10-CM | POA: Diagnosis not present

## 2016-12-08 DIAGNOSIS — R531 Weakness: Secondary | ICD-10-CM | POA: Diagnosis not present

## 2016-12-08 DIAGNOSIS — M25611 Stiffness of right shoulder, not elsewhere classified: Secondary | ICD-10-CM | POA: Diagnosis not present

## 2016-12-11 DIAGNOSIS — M25611 Stiffness of right shoulder, not elsewhere classified: Secondary | ICD-10-CM | POA: Diagnosis not present

## 2016-12-11 DIAGNOSIS — M25511 Pain in right shoulder: Secondary | ICD-10-CM | POA: Diagnosis not present

## 2016-12-11 DIAGNOSIS — R531 Weakness: Secondary | ICD-10-CM | POA: Diagnosis not present

## 2016-12-11 DIAGNOSIS — M75121 Complete rotator cuff tear or rupture of right shoulder, not specified as traumatic: Secondary | ICD-10-CM | POA: Diagnosis not present

## 2016-12-15 DIAGNOSIS — M25611 Stiffness of right shoulder, not elsewhere classified: Secondary | ICD-10-CM | POA: Diagnosis not present

## 2016-12-15 DIAGNOSIS — M75121 Complete rotator cuff tear or rupture of right shoulder, not specified as traumatic: Secondary | ICD-10-CM | POA: Diagnosis not present

## 2016-12-15 DIAGNOSIS — M25511 Pain in right shoulder: Secondary | ICD-10-CM | POA: Diagnosis not present

## 2016-12-18 DIAGNOSIS — M75121 Complete rotator cuff tear or rupture of right shoulder, not specified as traumatic: Secondary | ICD-10-CM | POA: Diagnosis not present

## 2016-12-18 DIAGNOSIS — M25511 Pain in right shoulder: Secondary | ICD-10-CM | POA: Diagnosis not present

## 2016-12-18 DIAGNOSIS — M25611 Stiffness of right shoulder, not elsewhere classified: Secondary | ICD-10-CM | POA: Diagnosis not present

## 2016-12-20 DIAGNOSIS — M25511 Pain in right shoulder: Secondary | ICD-10-CM | POA: Diagnosis not present

## 2016-12-20 DIAGNOSIS — M25611 Stiffness of right shoulder, not elsewhere classified: Secondary | ICD-10-CM | POA: Diagnosis not present

## 2016-12-20 DIAGNOSIS — M75121 Complete rotator cuff tear or rupture of right shoulder, not specified as traumatic: Secondary | ICD-10-CM | POA: Diagnosis not present

## 2016-12-25 DIAGNOSIS — M25611 Stiffness of right shoulder, not elsewhere classified: Secondary | ICD-10-CM | POA: Diagnosis not present

## 2016-12-25 DIAGNOSIS — M75121 Complete rotator cuff tear or rupture of right shoulder, not specified as traumatic: Secondary | ICD-10-CM | POA: Diagnosis not present

## 2016-12-25 DIAGNOSIS — R030 Elevated blood-pressure reading, without diagnosis of hypertension: Secondary | ICD-10-CM | POA: Diagnosis not present

## 2016-12-25 DIAGNOSIS — M25511 Pain in right shoulder: Secondary | ICD-10-CM | POA: Diagnosis not present

## 2016-12-25 DIAGNOSIS — R7301 Impaired fasting glucose: Secondary | ICD-10-CM | POA: Diagnosis not present

## 2016-12-25 DIAGNOSIS — E784 Other hyperlipidemia: Secondary | ICD-10-CM | POA: Diagnosis not present

## 2016-12-26 DIAGNOSIS — R030 Elevated blood-pressure reading, without diagnosis of hypertension: Secondary | ICD-10-CM | POA: Diagnosis not present

## 2016-12-26 DIAGNOSIS — R7301 Impaired fasting glucose: Secondary | ICD-10-CM | POA: Diagnosis not present

## 2016-12-26 DIAGNOSIS — E784 Other hyperlipidemia: Secondary | ICD-10-CM | POA: Diagnosis not present

## 2016-12-27 DIAGNOSIS — R531 Weakness: Secondary | ICD-10-CM | POA: Diagnosis not present

## 2016-12-27 DIAGNOSIS — M25611 Stiffness of right shoulder, not elsewhere classified: Secondary | ICD-10-CM | POA: Diagnosis not present

## 2016-12-27 DIAGNOSIS — M25511 Pain in right shoulder: Secondary | ICD-10-CM | POA: Diagnosis not present

## 2017-01-01 DIAGNOSIS — M75121 Complete rotator cuff tear or rupture of right shoulder, not specified as traumatic: Secondary | ICD-10-CM | POA: Diagnosis not present

## 2017-01-01 DIAGNOSIS — M25511 Pain in right shoulder: Secondary | ICD-10-CM | POA: Diagnosis not present

## 2017-01-01 DIAGNOSIS — M25611 Stiffness of right shoulder, not elsewhere classified: Secondary | ICD-10-CM | POA: Diagnosis not present

## 2017-01-03 DIAGNOSIS — M75121 Complete rotator cuff tear or rupture of right shoulder, not specified as traumatic: Secondary | ICD-10-CM | POA: Diagnosis not present

## 2017-01-03 DIAGNOSIS — M25511 Pain in right shoulder: Secondary | ICD-10-CM | POA: Diagnosis not present

## 2017-01-03 DIAGNOSIS — M25611 Stiffness of right shoulder, not elsewhere classified: Secondary | ICD-10-CM | POA: Diagnosis not present

## 2017-01-16 DIAGNOSIS — M25511 Pain in right shoulder: Secondary | ICD-10-CM | POA: Diagnosis not present

## 2017-01-16 DIAGNOSIS — M25611 Stiffness of right shoulder, not elsewhere classified: Secondary | ICD-10-CM | POA: Diagnosis not present

## 2017-01-16 DIAGNOSIS — M75121 Complete rotator cuff tear or rupture of right shoulder, not specified as traumatic: Secondary | ICD-10-CM | POA: Diagnosis not present

## 2017-01-18 DIAGNOSIS — K5792 Diverticulitis of intestine, part unspecified, without perforation or abscess without bleeding: Secondary | ICD-10-CM | POA: Diagnosis not present

## 2017-01-18 DIAGNOSIS — R5383 Other fatigue: Secondary | ICD-10-CM | POA: Diagnosis not present

## 2017-01-24 DIAGNOSIS — H04123 Dry eye syndrome of bilateral lacrimal glands: Secondary | ICD-10-CM | POA: Diagnosis not present

## 2017-01-24 DIAGNOSIS — H4322 Crystalline deposits in vitreous body, left eye: Secondary | ICD-10-CM | POA: Diagnosis not present

## 2017-01-24 DIAGNOSIS — H2513 Age-related nuclear cataract, bilateral: Secondary | ICD-10-CM | POA: Diagnosis not present

## 2017-01-31 DIAGNOSIS — Z09 Encounter for follow-up examination after completed treatment for conditions other than malignant neoplasm: Secondary | ICD-10-CM | POA: Diagnosis not present

## 2017-01-31 DIAGNOSIS — M75121 Complete rotator cuff tear or rupture of right shoulder, not specified as traumatic: Secondary | ICD-10-CM | POA: Diagnosis not present

## 2017-02-01 ENCOUNTER — Other Ambulatory Visit: Payer: Self-pay | Admitting: Internal Medicine

## 2017-02-01 DIAGNOSIS — K5792 Diverticulitis of intestine, part unspecified, without perforation or abscess without bleeding: Secondary | ICD-10-CM

## 2017-02-02 ENCOUNTER — Ambulatory Visit
Admission: RE | Admit: 2017-02-02 | Discharge: 2017-02-02 | Disposition: A | Discharge status: Home / Self Care | Payer: 59 | Source: Ambulatory Visit | Attending: Internal Medicine | Admitting: Internal Medicine

## 2017-02-02 DIAGNOSIS — K5792 Diverticulitis of intestine, part unspecified, without perforation or abscess without bleeding: Secondary | ICD-10-CM

## 2017-02-02 DIAGNOSIS — K5730 Diverticulosis of large intestine without perforation or abscess without bleeding: Secondary | ICD-10-CM | POA: Diagnosis not present

## 2017-02-02 MED ORDER — IOPAMIDOL (ISOVUE-300) INJECTION 61%
100.0000 mL | Freq: Once | INTRAVENOUS | Status: AC | PRN
  Administered 2017-02-02: 100 mL via INTRAVENOUS

## 2017-02-20 ENCOUNTER — Telehealth: Payer: Self-pay

## 2017-02-20 NOTE — Telephone Encounter (Signed)
Pt refused HST setup on 09/13/17

## 2017-02-21 ENCOUNTER — Encounter: Payer: Self-pay | Admitting: Internal Medicine

## 2017-02-21 ENCOUNTER — Ambulatory Visit (INDEPENDENT_AMBULATORY_CARE_PROVIDER_SITE_OTHER): Payer: 59 | Admitting: Internal Medicine

## 2017-02-21 VITALS — BP 100/72 | HR 90 | Resp 18 | Ht 62.0 in | Wt 165.0 lb

## 2017-02-21 DIAGNOSIS — K5732 Diverticulitis of large intestine without perforation or abscess without bleeding: Secondary | ICD-10-CM

## 2017-02-21 NOTE — Patient Instructions (Signed)
Please follow up as needed 

## 2017-02-21 NOTE — Progress Notes (Signed)
HISTORY OF PRESENT ILLNESS:  Lisa Moody is a 71 y.o. female with past medical history as listed below who has been followed in this office for GERD and diverticulosis complicated by diverticulitis. The patient was last seen a little over 3 years ago when she underwent surveillance colonoscopy on 12/04/2013. She was found to have severe pandiverticulosis. 2 polyps were removed one of which was a tubular adenoma. Follow-up in 5 years recommended. She presents today with a chief complaint of diverticulitis. She did have a clinical episode of diverticulitis in 2013 which responded to appropriate antibiotic therapy. No problems until early March, just prior to anticipated trip to [place], she developed left-sided abdominal discomfort reminiscent of her previous episode of diverticulitis. She was treated with antibiotics and improved. However developed some recurrent symptoms for which she was placed on additional antibiotics including Flagyl. She was set up for a contrast-enhanced CT scan of the abdomen and pelvis which was completed 02/02/2017. In addition to diffuse diverticulosis she was found to have 2 separate areas of inflammatory stranding around the colon at the hepatic flexure and proximal descending region consistent with uncomplicated diverticulitis. She has modified her diet. She has completed antibiotics. Problems with pain have resolved without recurrence. She did lose about 8 pounds. No new GI complaints except for some nausea. PPI controls reflux symptoms.  REVIEW OF SYSTEMS:  All non-GI ROS negative except for remote back pain  Past Medical History:  Diagnosis Date  . Arthritis   . Benign neoplasm of colon   . Complication of anesthesia    anectine - caused her to have difficulty waking up and was on vent for few hours post op  . Diverticulitis   . Diverticulosis of colon (without mention of hemorrhage)   . Endometriosis    reason for hysterectomy  . Esophageal reflux    few times  per month not routine  . Esophagitis, unspecified   . Headache(784.0)   . Osteopenia   . Other and unspecified hyperlipidemia   . Other and unspecified hyperlipidemia   . Other constipation   . Palpitations   . Personal history of other mental disorder   . PONV (postoperative nausea and vomiting)   . Pulmonary embolism ([place])    2006    Past Surgical History:  Procedure Laterality Date  . ABDOMINAL HYSTERECTOMY    . ABDOMINOPLASTY    . APPENDECTOMY    . CARDIAC CATHETERIZATION     09/06/10: normal coronaries and LV function (saw Dr. Rollene Fare in 2011)  . COLONOSCOPY    . JOINT REPLACEMENT     knee replacement  . LIPOSUCTION    . LUMBAR FUSION  March 2014  . LUMBAR LAMINECTOMY    . PARTIAL HYSTERECTOMY    . SHOULDER ARTHROSCOPY WITH ROTATOR CUFF REPAIR AND SUBACROMIAL DECOMPRESSION Right 02/26/2015   Procedure: RIGHT SHOULDER ARTHROSCOPY DEBRIDEMENT WITH SUBACROMIAL DECOMPRESSION, , ROTATOR CUFF REPAIR ;  Surgeon: Elsie Saas, MD;  Location: [facility];  Service: Orthopedics;  Laterality: Right;  . TOTAL KNEE ARTHROPLASTY  2010   left  . TUBAL LIGATION      Social History LYNIAH FUJITA  reports that she has never smoked. She has never used smokeless tobacco. She reports that she drinks about 1.8 oz of alcohol per week . She reports that she does not use drugs.  family history includes Breast cancer in her mother; Diabetes in her mother and paternal grandmother; Lung cancer in her father; Tics in her father.  Allergies  Allergen Reactions  . Anectine  [Succinylcholine Chloride] Anaphylaxis  . Succinylcholine     Respiratory suppression   . Multi Vitamin-Fluoride [Multivitamins-Fluoride] Nausea Only       PHYSICAL EXAMINATION: Vital signs: BP 100/72   Pulse 90   Resp 18   Ht 5\' 2"  (1.575 m)   Wt 165 lb (74.8 kg)   BMI 30.18 kg/m   Constitutional: generally well-appearing, no acute distress Psychiatric: alert and oriented x3, cooperative Eyes:  extraocular movements intact, anicteric, conjunctiva pink Mouth: oral pharynx moist, no lesions Neck: supple without thyromegaly Lymph: no lymphadenopathy Cardiovascular: heart regular rate and rhythm, no murmur Lungs: clear to auscultation bilaterally Abdomen: soft, nontender, nondistended, no obvious ascites, no peritoneal signs, normal bowel sounds, no organomegaly Rectal: Omitted Extremities: no clubbing cyanosis or lower extremity edema bilaterally Skin: no lesions on visible extremities Neuro: No focal deficits. Normal DTRs. Cranial nerves intact  ASSESSMENT:  #1. Acute diverticulitis. Resolved after 2 courses of antibiotics. No complicating features #2. Pandiverticulosis #3. History of adenomatous colon polyps. Last colonoscopy January 2015 #4. GERD. Managed with PPI   PLAN:  #1. Discussed diverticular disease #2. Literature on diverticular disease provided #3. High-fiber diet #4. Liberalize diet #5. Contact GI or her PCP for recurrent symptoms or questions #6. Reflux precautions #7. Continue PPI. Lowest dose to control symptoms complex #8. Surveillance colonoscopy around January 2020 #5. Ongoing general medical care with Dr. Joylene Draft

## 2017-04-23 DIAGNOSIS — L820 Inflamed seborrheic keratosis: Secondary | ICD-10-CM | POA: Diagnosis not present

## 2017-04-23 DIAGNOSIS — L821 Other seborrheic keratosis: Secondary | ICD-10-CM | POA: Diagnosis not present

## 2017-04-23 DIAGNOSIS — Z85828 Personal history of other malignant neoplasm of skin: Secondary | ICD-10-CM | POA: Diagnosis not present

## 2017-04-23 DIAGNOSIS — D2239 Melanocytic nevi of other parts of face: Secondary | ICD-10-CM | POA: Diagnosis not present

## 2017-05-09 DIAGNOSIS — M75121 Complete rotator cuff tear or rupture of right shoulder, not specified as traumatic: Secondary | ICD-10-CM | POA: Diagnosis not present

## 2017-05-09 DIAGNOSIS — Z9889 Other specified postprocedural states: Secondary | ICD-10-CM | POA: Diagnosis not present

## 2017-07-04 ENCOUNTER — Telehealth: Payer: Self-pay | Admitting: Obstetrics and Gynecology

## 2017-07-04 NOTE — Telephone Encounter (Signed)
Patient is experiencing a dull ache and discomfort in her vaginal area.

## 2017-07-04 NOTE — Telephone Encounter (Signed)
Spoke with patient. Reports vaginal "achiness", like a "toothache" that has been ongoing for a couple of weeks. Patient states this happened once several years ago and it went away. Reports chronic lower back pain, unable to tell if any difference.   Denies urinary complaints, fever/chills, N/V, discharge/odor/bleeding.  Recommended OV for further evaluation. Last AEX 11/10/13. Patient scheduled for OV on 07/09/17 at 10:30am with Dr. Quincy Simmonds. Patient declined earlier appointments offered on 8/15 & 8/16.   Advised patient should symptoms worsen, return call to office for earlier appointment or see care at local ER. Will review with Dr. Quincy Simmonds and return call with any additional recommendations, patient is agreeable.   Confirmed Insurance on file is up to date.  Routing to provider for final review. Patient is agreeable to disposition. Will close encounter.

## 2017-07-06 NOTE — Progress Notes (Signed)
GYNECOLOGY  VISIT   HPI: 71 y.o.   Married  Caucasian  female   G3P3 with No LMP recorded. Patient has had a hysterectomy.   here for vaginal ache for over a month; Per patient no discharge, itchiness, burning, or urinary discomfort. Pain occurs most of the time.  Feels better when she is off her feet and worse when she is moving around more.  Ibuprofen relieves the pain a little bit.  Denies bowel problems, blood in stool, changes in the bowel function.  This happened in the past when she saw Dr. Joan Flores.  She did physical therapy in the past and did not enjoy it.   Thinks her bladder may be dropped.  Hx back issues.  Hx lumbar spine surgeries.   Has diverticulitis.   No new partner.  No vaginal internal productions.   Asking for antidepressant.  Took in the past.  Has blue moments.  Used Wellbutrin for this years ago by Dr. Marvia Pickles.  Concerned about weight gain because she cannot exercise.  Several friends died.  Denies suicidal ideation or homicidal ideation.   Urine dip - negative.  GYNECOLOGIC HISTORY: No LMP recorded. Patient has had a hysterectomy. Contraception:  Tubal/Hysterectomy Menopausal hormone therapy:  none Last mammogram: 08-23-16 Density B/Neg/BiRads1:TBC Last pap smear: 2002 Neg        OB History    Gravida Para Term Preterm AB Living   3 3       3    SAB TAB Ectopic Multiple Live Births                     Patient Active Problem List   Diagnosis Date Noted  . Traumatic rotator cuff tear right shoulder 02/15/2015  . PONV (postoperative nausea and vomiting)   . Melanocytic nevus 02/17/2014  . Skin cancer 09/03/2013  . NONSPEC ELEVATION OF LEVELS OF TRANSAMINASE/LDH 10/18/2010  . DIVERTICULITIS-COLON 09/28/2010  . DEGENERATIVE JOINT DISEASE, LEFT KNEE 09/15/2010  . PULMONARY EMBOLISM, HX OF 09/15/2010  . RHINITIS 05/14/2008  . COLONIC POLYPS 01/22/2008  . ESOPHAGITIS 01/22/2008  . DIVERTICULAR DISEASE 01/22/2008  . CONSTIPATION, CHRONIC  01/22/2008  . DEPRESSION, HX OF 01/22/2008  . GASTROESOPHAGEAL REFLUX DISEASE, CHRONIC 08/13/2007  . HYPERLIPIDEMIA 01/06/2007    Past Medical History:  Diagnosis Date  . Arthritis   . Benign neoplasm of colon   . Complication of anesthesia    anectine - caused her to have difficulty waking up and was on vent for few hours post op  . Diverticulitis   . Diverticulosis of colon (without mention of hemorrhage)   . Endometriosis    reason for hysterectomy.  ovaries remain.   . Esophageal reflux    few times per month not routine  . Esophagitis, unspecified   . Headache(784.0)   . Osteopenia   . Other and unspecified hyperlipidemia   . Other and unspecified hyperlipidemia   . Other constipation   . Palpitations   . Personal history of other mental disorder   . PONV (postoperative nausea and vomiting)   . Pulmonary embolism ([place])    2006    Past Surgical History:  Procedure Laterality Date  . ABDOMINAL HYSTERECTOMY    . ABDOMINOPLASTY    . APPENDECTOMY    . CARDIAC CATHETERIZATION     09/06/10: normal coronaries and LV function (saw Dr. Rollene Fare in 2011)  . COLONOSCOPY    . JOINT REPLACEMENT     knee replacement  . LIPOSUCTION    .  LUMBAR FUSION  March 2014  . LUMBAR LAMINECTOMY    . PARTIAL HYSTERECTOMY    . SHOULDER ARTHROSCOPY WITH ROTATOR CUFF REPAIR AND SUBACROMIAL DECOMPRESSION Right 02/26/2015   Procedure: RIGHT SHOULDER ARTHROSCOPY DEBRIDEMENT WITH SUBACROMIAL DECOMPRESSION, , ROTATOR CUFF REPAIR ;  Surgeon: Elsie Saas, MD;  Location: [facility];  Service: Orthopedics;  Laterality: Right;  . TOTAL KNEE ARTHROPLASTY  2010   left  . TUBAL LIGATION      Current Outpatient Prescriptions  Medication Sig Dispense Refill  . atorvastatin (LIPITOR) 10 MG tablet Take 10 mg by mouth daily.    Marland Kitchen docusate sodium (COLACE) 100 MG capsule Take 100 mg by mouth at bedtime.    Marland Kitchen ezetimibe (ZETIA) 10 MG tablet Take 10 mg by mouth daily.    . pantoprazole  (PROTONIX) 20 MG tablet Take 1 tablet (20 mg total) by mouth daily as needed. Acid reflux. 10 tablet 0  . polyethylene glycol (MIRALAX / GLYCOLAX) packet Take 17 g by mouth daily.     No current facility-administered medications for this visit.      ALLERGIES: Anectine  [succinylcholine chloride]; Succinylcholine; and Multi vitamin-fluoride [multivitamins-fluoride]  Family History  Problem Relation Age of Onset  . Tics Father   . Lung cancer Father   . Breast cancer Mother   . Diabetes Mother   . Diabetes Paternal Grandmother   . Colon cancer Neg Hx   . Esophageal cancer Neg Hx   . Rectal cancer Neg Hx   . Stomach cancer Neg Hx     Social History   Social History  . Marital status: Married    Spouse name: N/A  . Number of children: 3  . Years of education: college   Occupational History  . retired Unemployed   Social History Main Topics  . Smoking status: Never Smoker  . Smokeless tobacco: Never Used  . Alcohol use 1.8 oz/week    3 Glasses of wine per week     Comment: socially  . Drug use: No  . Sexual activity: Yes    Partners: Male    Birth control/ protection: Surgical     Comment: TAH   Other Topics Concern  . Not on file   Social History Narrative   Drinks 2 cup coffee a day     ROS:  Pertinent items are noted in HPI.  PHYSICAL EXAMINATION:    BP 130/72 (BP Location: Right Arm, Patient Position: Sitting, Cuff Size: Normal)   Pulse 88   Resp 16   Wt 177 lb (80.3 kg)   BMI 32.37 kg/m     General appearance: alert, cooperative and appears stated age   Lungs CTA bilaterally.  Cor S1S2 RRR.  Abdomen:  abdominoplasty incisions, soft, nontender, no masses.  Pelvic: External genitalia:  no lesions              Urethra:  normal appearing urethra with no masses, tenderness or lesions              Bartholins and Skenes: normal                 Vagina: normal appearing vagina with normal color and discharge, no lesions.  Third degree cystocele, first  degree vault prolapse, first degree rectocele.   Left vaginal cuff mass - ovary versus bowel.              Cervix:  Absent.  Bimanual Exam:  Uterus:   Absent.               Adnexa: no mass, fullness, tenderness              Rectal exam: Yes.  .  Confirms.              Anus:  normal sphincter tone, no lesions  Chaperone was present for exam.  ASSESSMENT  Status post hysterectomy for endometriosis.  Ovaries remain.  Hx diverticulitis.  Hx PE post op in past.  Cystocele/rectocele.  Pelvic mass.  Mood disorder.  Depression symptoms.   PLAN  Return for pelvic ultrasound.  Discussed pessary versus surgery for prolapse.  She declines the latter.  She may have a pessary fitting in the future.  ACOG HO on prolapse. Wellbutrin 150 mg XL daily.  Follow up in 6 weeks for annual exam and recheck on depression tx.  I did give her information for counseling through [facility].  An After Visit Summary was printed and given to the patient.  _25_____ minutes face to face time of which over 50% was spent in counseling.

## 2017-07-09 ENCOUNTER — Encounter: Payer: Self-pay | Admitting: Obstetrics and Gynecology

## 2017-07-09 ENCOUNTER — Ambulatory Visit (INDEPENDENT_AMBULATORY_CARE_PROVIDER_SITE_OTHER): Payer: 59 | Admitting: Obstetrics and Gynecology

## 2017-07-09 VITALS — BP 130/72 | HR 88 | Resp 16 | Wt 177.0 lb

## 2017-07-09 DIAGNOSIS — F39 Unspecified mood [affective] disorder: Secondary | ICD-10-CM | POA: Diagnosis not present

## 2017-07-09 DIAGNOSIS — R1900 Intra-abdominal and pelvic swelling, mass and lump, unspecified site: Secondary | ICD-10-CM

## 2017-07-09 DIAGNOSIS — N8110 Cystocele, unspecified: Secondary | ICD-10-CM

## 2017-07-09 MED ORDER — BUPROPION HCL ER (XL) 150 MG PO TB24: 150.0000 mg | ORAL_TABLET | Freq: Every day | ORAL | 1 refills | Status: DC

## 2017-07-09 NOTE — Patient Instructions (Signed)
Bupropion extended-release tablets (Depression/Mood Disorders) What is this medicine? BUPROPION (byoo PROE pee on) is used to treat depression. This medicine may be used for other purposes; ask your health care provider or pharmacist if you have questions. COMMON BRAND NAME(S): Aplenzin, Budeprion XL, Forfivo XL, Wellbutrin XL What should I tell my health care provider before I take this medicine? They need to know if you have any of these conditions: -an eating disorder, such as anorexia or bulimia -bipolar disorder or psychosis -diabetes or high blood sugar, treated with medication -glaucoma -head injury or brain tumor -heart disease, previous heart attack, or irregular heart beat -high blood pressure -kidney or liver disease -seizures (convulsions) -suicidal thoughts or a previous suicide attempt -Tourette's syndrome -weight loss -an unusual or allergic reaction to bupropion, other medicines, foods, dyes, or preservatives -breast-feeding -pregnant or trying to become pregnant How should I use this medicine? Take this medicine by mouth with a glass of water. Follow the directions on the prescription label. You can take it with or without food. If it upsets your stomach, take it with food. Do not crush, chew, or cut these tablets. This medicine is taken once daily at the same time each day. Do not take your medicine more often than directed. Do not stop taking this medicine suddenly except upon the advice of your doctor. Stopping this medicine too quickly may cause serious side effects or your condition may worsen. A special MedGuide will be given to you by the pharmacist with each prescription and refill. Be sure to read this information carefully each time. Talk to your pediatrician regarding the use of this medicine in children. Special care may be needed. Overdosage: If you think you have taken too much of this medicine contact a poison control center or emergency room at once. NOTE:  This medicine is only for you. Do not share this medicine with others. What if I miss a dose? If you miss a dose, skip the missed dose and take your next tablet at the regular time. Do not take double or extra doses. What may interact with this medicine? Do not take this medicine with any of the following medications: -linezolid -MAOIs like Azilect, Carbex, Eldepryl, Marplan, Nardil, and Parnate -methylene blue (injected into a vein) -other medicines that contain bupropion like Zyban This medicine may also interact with the following medications: -alcohol -certain medicines for anxiety or sleep -certain medicines for blood pressure like metoprolol, propranolol -certain medicines for depression or psychotic disturbances -certain medicines for HIV or AIDS like efavirenz, lopinavir, nelfinavir, ritonavir -certain medicines for irregular heart beat like propafenone, flecainide -certain medicines for Parkinson's disease like amantadine, levodopa -certain medicines for seizures like carbamazepine, phenytoin, phenobarbital -cimetidine -clopidogrel -cyclophosphamide -digoxin -furazolidone -isoniazid -nicotine -orphenadrine -procarbazine -steroid medicines like prednisone or cortisone -stimulant medicines for attention disorders, weight loss, or to stay awake -tamoxifen -theophylline -thiotepa -ticlopidine -tramadol -warfarin This list may not describe all possible interactions. Give your health care provider a list of all the medicines, herbs, non-prescription drugs, or dietary supplements you use. Also tell them if you smoke, drink alcohol, or use illegal drugs. Some items may interact with your medicine. What should I watch for while using this medicine? Tell your doctor if your symptoms do not get better or if they get worse. Visit your doctor or health care professional for regular checks on your progress. Because it may take several weeks to see the full effects of this medicine, it  is important to continue your treatment as   prescribed by your doctor. Patients and their families should watch out for new or worsening thoughts of suicide or depression. Also watch out for sudden changes in feelings such as feeling anxious, agitated, panicky, irritable, hostile, aggressive, impulsive, severely restless, overly excited and hyperactive, or not being able to sleep. If this happens, especially at the beginning of treatment or after a change in dose, call your health care professional. Avoid alcoholic drinks while taking this medicine. Drinking large amounts of alcoholic beverages, using sleeping or anxiety medicines, or quickly stopping the use of these agents while taking this medicine may increase your risk for a seizure. Do not drive or use heavy machinery until you know how this medicine affects you. This medicine can impair your ability to perform these tasks. Do not take this medicine close to bedtime. It may prevent you from sleeping. Your mouth may get dry. Chewing sugarless gum or sucking hard candy, and drinking plenty of water may help. Contact your doctor if the problem does not go away or is severe. The tablet shell for some brands of this medicine does not dissolve. This is normal. The tablet shell may appear whole in the stool. This is not a cause for concern. What side effects may I notice from receiving this medicine? Side effects that you should report to your doctor or health care professional as soon as possible: -allergic reactions like skin rash, itching or hives, swelling of the face, lips, or tongue -breathing problems -changes in vision -confusion -elevated mood, decreased need for sleep, racing thoughts, impulsive behavior -fast or irregular heartbeat -hallucinations, loss of contact with reality -increased blood pressure -redness, blistering, peeling or loosening of the skin, including inside the mouth -seizures -suicidal thoughts or other mood  changes -unusually weak or tired -vomiting Side effects that usually do not require medical attention (report to your doctor or health care professional if they continue or are bothersome): -constipation -headache -loss of appetite -nausea -tremors -weight loss This list may not describe all possible side effects. Call your doctor for medical advice about side effects. You may report side effects to FDA at 1-800-FDA-1088. Where should I keep my medicine? Keep out of the reach of children. Store at room temperature between 15 and 30 degrees C (59 and 86 degrees F). Throw away any unused medicine after the expiration date. NOTE: This sheet is a summary. It may not cover all possible information. If you have questions about this medicine, talk to your doctor, pharmacist, or health care provider.  2018 Elsevier/Gold Standard (2016-04-28 13:55:13)  

## 2017-07-10 ENCOUNTER — Telehealth: Payer: Self-pay | Admitting: Obstetrics and Gynecology

## 2017-07-10 NOTE — Telephone Encounter (Signed)
Spoke with patient regarding benefit for recommended ultrasound. Patient understood and agreeable. Patient ready to schedule. Patient scheduled 07/12/17 with Dr Quincy Simmonds. Patient aware of appointment date, arrival time and cancellation policy. No further questions.   Routing to Dr Quincy Simmonds

## 2017-07-10 NOTE — Telephone Encounter (Signed)
Thank you for the update.  Encounter closed. 

## 2017-07-12 ENCOUNTER — Encounter: Payer: Self-pay | Admitting: Obstetrics and Gynecology

## 2017-07-12 ENCOUNTER — Ambulatory Visit (INDEPENDENT_AMBULATORY_CARE_PROVIDER_SITE_OTHER): Payer: 59

## 2017-07-12 ENCOUNTER — Ambulatory Visit (INDEPENDENT_AMBULATORY_CARE_PROVIDER_SITE_OTHER): Payer: 59 | Admitting: Obstetrics and Gynecology

## 2017-07-12 VITALS — BP 122/80 | HR 76 | Ht 62.0 in | Wt 177.0 lb

## 2017-07-12 DIAGNOSIS — N393 Stress incontinence (female) (male): Secondary | ICD-10-CM | POA: Insufficient documentation

## 2017-07-12 DIAGNOSIS — R102 Pelvic and perineal pain: Secondary | ICD-10-CM | POA: Diagnosis not present

## 2017-07-12 DIAGNOSIS — N816 Rectocele: Secondary | ICD-10-CM | POA: Insufficient documentation

## 2017-07-12 DIAGNOSIS — N8110 Cystocele, unspecified: Secondary | ICD-10-CM | POA: Insufficient documentation

## 2017-07-12 DIAGNOSIS — R1900 Intra-abdominal and pelvic swelling, mass and lump, unspecified site: Secondary | ICD-10-CM | POA: Diagnosis not present

## 2017-07-12 NOTE — Progress Notes (Signed)
Encounter reviewed by Dr. Alyshia Kernan Amundson C. Silva.  

## 2017-07-12 NOTE — Progress Notes (Signed)
GYNECOLOGY  VISIT   HPI: 71 y.o.   Married  Caucasian  female   G3P3 with No LMP recorded. Patient has had a hysterectomy.   here for pelvic ultrasound for left vaginal cuff mass.    Just seen on 07/09/17 for pelvic pressure and was diagnosed with cystocele/rectocele and vaginal cuff mass.  Occasionally has leakage of urine if waits too long to empty bladder or with sneeze or cough.  Cannot make it on time to toilet.   Asking what kind of surgery she would need if she wanted it.   Urine dip is negative.   She reported depression symptoms, and was started on Wellbutrin this week.   GYNECOLOGIC HISTORY: No LMP recorded. Patient has had a hysterectomy. Contraception:  Tubal/Hysterectomy Menopausal hormone therapy:  none Last mammogram:  08-23-16 Density B/Neg/BiRads1:TBC Last pap smear: 2002 Neg        OB History    Gravida Para Term Preterm AB Living   3 3       3    SAB TAB Ectopic Multiple Live Births                     Patient Active Problem List   Diagnosis Date Noted  . Traumatic rotator cuff tear right shoulder 02/15/2015  . PONV (postoperative nausea and vomiting)   . Melanocytic nevus 02/17/2014  . Skin cancer 09/03/2013  . NONSPEC ELEVATION OF LEVELS OF TRANSAMINASE/LDH 10/18/2010  . DIVERTICULITIS-COLON 09/28/2010  . DEGENERATIVE JOINT DISEASE, LEFT KNEE 09/15/2010  . PULMONARY EMBOLISM, HX OF 09/15/2010  . RHINITIS 05/14/2008  . COLONIC POLYPS 01/22/2008  . ESOPHAGITIS 01/22/2008  . DIVERTICULAR DISEASE 01/22/2008  . CONSTIPATION, CHRONIC 01/22/2008  . DEPRESSION, HX OF 01/22/2008  . GASTROESOPHAGEAL REFLUX DISEASE, CHRONIC 08/13/2007  . HYPERLIPIDEMIA 01/06/2007    Past Medical History:  Diagnosis Date  . Arthritis   . Benign neoplasm of colon   . Complication of anesthesia    anectine - caused her to have difficulty waking up and was on vent for few hours post op  . Diverticulitis   . Diverticulosis of colon (without mention of hemorrhage)   .  Endometriosis    reason for hysterectomy.  ovaries remain.   . Esophageal reflux    few times per month not routine  . Esophagitis, unspecified   . Headache(784.0)   . Osteopenia   . Other and unspecified hyperlipidemia   . Other and unspecified hyperlipidemia   . Other constipation   . Palpitations   . Personal history of other mental disorder   . PONV (postoperative nausea and vomiting)   . Pulmonary embolism ([place])    2006    Past Surgical History:  Procedure Laterality Date  . ABDOMINAL HYSTERECTOMY    . ABDOMINOPLASTY    . APPENDECTOMY    . CARDIAC CATHETERIZATION     09/06/10: normal coronaries and LV function (saw Dr. Rollene Fare in 2011)  . COLONOSCOPY    . JOINT REPLACEMENT     knee replacement  . LIPOSUCTION    . LUMBAR FUSION  March 2014  . LUMBAR LAMINECTOMY    . PARTIAL HYSTERECTOMY    . SHOULDER ARTHROSCOPY WITH ROTATOR CUFF REPAIR AND SUBACROMIAL DECOMPRESSION Right 02/26/2015   Procedure: RIGHT SHOULDER ARTHROSCOPY DEBRIDEMENT WITH SUBACROMIAL DECOMPRESSION, , ROTATOR CUFF REPAIR ;  Surgeon: Elsie Saas, MD;  Location: [facility];  Service: Orthopedics;  Laterality: Right;  . TOTAL KNEE ARTHROPLASTY  2010   left  . TUBAL LIGATION  Current Outpatient Prescriptions  Medication Sig Dispense Refill  . atorvastatin (LIPITOR) 10 MG tablet Take 10 mg by mouth daily.    Marland Kitchen buPROPion (WELLBUTRIN XL) 150 MG 24 hr tablet Take 1 tablet (150 mg total) by mouth daily. 30 tablet 1  . docusate sodium (COLACE) 100 MG capsule Take 100 mg by mouth at bedtime.    Marland Kitchen ezetimibe (ZETIA) 10 MG tablet Take 10 mg by mouth daily.    . pantoprazole (PROTONIX) 20 MG tablet Take 1 tablet (20 mg total) by mouth daily as needed. Acid reflux. 10 tablet 0  . polyethylene glycol (MIRALAX / GLYCOLAX) packet Take 17 g by mouth daily.     No current facility-administered medications for this visit.      ALLERGIES: Anectine  [succinylcholine chloride]; Succinylcholine; and  Multi vitamin-fluoride [multivitamins-fluoride]  Family History  Problem Relation Age of Onset  . Tics Father   . Lung cancer Father   . Breast cancer Mother   . Diabetes Mother   . Diabetes Paternal Grandmother   . Colon cancer Neg Hx   . Esophageal cancer Neg Hx   . Rectal cancer Neg Hx   . Stomach cancer Neg Hx     Social History   Social History  . Marital status: Married    Spouse name: N/A  . Number of children: 3  . Years of education: college   Occupational History  . retired Unemployed   Social History Main Topics  . Smoking status: Never Smoker  . Smokeless tobacco: Never Used  . Alcohol use 1.8 oz/week    3 Glasses of wine per week     Comment: socially  . Drug use: No  . Sexual activity: Yes    Partners: Male    Birth control/ protection: Surgical     Comment: TAH   Other Topics Concern  . Not on file   Social History Narrative   Drinks 2 cup coffee a day     ROS:  Pertinent items are noted in HPI.  PHYSICAL EXAMINATION:    BP 122/80 (BP Location: Right Arm, Patient Position: Sitting, Cuff Size: Small)   Pulse 76   Ht 5\' 2"  (1.575 m)   Wt 177 lb (80.3 kg)   BMI 32.37 kg/m     General appearance: alert, cooperative and appears stated age  Pelvic ultrasound: Normal vaginal cuff.  Uterus absent.  Normal ovaries.  No free fluid.   ASSESSMENT  Pelvic pressure. No evidence of pelvic mass.  Cystocele and rectocele.  Stress incontinence. Hx diverticulitis.  Depression.  PLAN  Reassurance regarding pelvic US findings.  I suspect the area I felt was a loop of bowel.  I recommended urodynamic testing if patient wishes to pursue surgery.  I would anticipated a potential anterior and posterior colporrhaphy with potential TVT Exact midurethral sling and cystoscopy.  Follow up in October for recheck of response to Wellbutrin XL.    An After Visit Summary was printed and given to the patient.  _15_____ minutes face to face time of which  over 50% was spent in counseling.

## 2017-07-25 DIAGNOSIS — Z09 Encounter for follow-up examination after completed treatment for conditions other than malignant neoplasm: Secondary | ICD-10-CM | POA: Diagnosis not present

## 2017-07-25 DIAGNOSIS — Z9889 Other specified postprocedural states: Secondary | ICD-10-CM | POA: Insufficient documentation

## 2017-08-17 NOTE — Progress Notes (Signed)
71 y.o. G3P3 Married Caucasian female here for annual exam and follow up.  Patient states Wellbutrin is helping her depression. Still having some blue days but mostly not.  Some weight gain.  Weight Watchers not working well.   Vaginal discomfort is gone.  Bowel and bladder function is normal.  Labs with PCP. Has appt with PCP in Nov. 2018.   PCP:  Crist Infante, MD   No LMP recorded. Patient has had a hysterectomy.           Sexually active: Yes.   female The current method of family planning is status post hysterectomy.    Exercising: Yes.    Walking and tennis Smoker:  no  Health Maintenance: Pap: 2002 Neg History of abnormal Pap:  no MMG: 08-23-16 Density B/Neg/BiRads1:TBC.  She will schedule. Colonoscopy: 12-04-13 polyps with Dr.Perry;next due 2020 BMD:  10-10-17Result: Osteopenia of hip:TBC TDaP: 11-10-13 Gardasil:   no HIV: no Hep C:no Screening Labs:  Hb today: PCP, Urine today: collected   reports that she has never smoked. She has never used smokeless tobacco. She reports that she drinks alcohol. She reports that she does not use drugs.  Past Medical History:  Diagnosis Date  . Arthritis   . Benign neoplasm of colon   . Complication of anesthesia    anectine - caused her to have difficulty waking up and was on vent for few hours post op  . Diverticulitis   . Diverticulosis of colon (without mention of hemorrhage)   . Endometriosis    reason for hysterectomy.  ovaries remain.   . Esophageal reflux    few times per month not routine  . Esophagitis, unspecified   . Headache(784.0)   . Osteopenia   . Other and unspecified hyperlipidemia   . Other and unspecified hyperlipidemia   . Other constipation   . Palpitations   . Personal history of other mental disorder   . PONV (postoperative nausea and vomiting)   . Pulmonary embolism ([place])    2006    Past Surgical History:  Procedure Laterality Date  . ABDOMINAL HYSTERECTOMY    . ABDOMINOPLASTY    .  APPENDECTOMY    . CARDIAC CATHETERIZATION     09/06/10: normal coronaries and LV function (saw Dr. Rollene Fare in 2011)  . COLONOSCOPY    . JOINT REPLACEMENT     knee replacement  . LIPOSUCTION    . LUMBAR FUSION  March 2014  . LUMBAR LAMINECTOMY    . PARTIAL HYSTERECTOMY    . SHOULDER ARTHROSCOPY WITH ROTATOR CUFF REPAIR AND SUBACROMIAL DECOMPRESSION Right 02/26/2015   Procedure: RIGHT SHOULDER ARTHROSCOPY DEBRIDEMENT WITH SUBACROMIAL DECOMPRESSION, , ROTATOR CUFF REPAIR ;  Surgeon: Elsie Saas, MD;  Location: [facility];  Service: Orthopedics;  Laterality: Right;  . TOTAL KNEE ARTHROPLASTY  2010   left  . TUBAL LIGATION      Current Outpatient Prescriptions  Medication Sig Dispense Refill  . atorvastatin (LIPITOR) 10 MG tablet Take 10 mg by mouth daily.    Marland Kitchen buPROPion (WELLBUTRIN XL) 150 MG 24 hr tablet Take 1 tablet (150 mg total) by mouth daily. 30 tablet 1  . docusate sodium (COLACE) 100 MG capsule Take 100 mg by mouth at bedtime.    Marland Kitchen ezetimibe (ZETIA) 10 MG tablet Take 10 mg by mouth daily.    . pantoprazole (PROTONIX) 20 MG tablet Take 1 tablet (20 mg total) by mouth daily as needed. Acid reflux. 10 tablet 0  . polyethylene glycol (MIRALAX /  GLYCOLAX) packet Take 17 g by mouth daily.     No current facility-administered medications for this visit.     Family History  Problem Relation Age of Onset  . Tics Father   . Lung cancer Father   . Breast cancer Mother   . Diabetes Mother   . Diabetes Paternal Grandmother   . Colon cancer Neg Hx   . Esophageal cancer Neg Hx   . Rectal cancer Neg Hx   . Stomach cancer Neg Hx     ROS:  Pertinent items are noted in HPI.  Otherwise, a comprehensive ROS was negative.  Exam:   BP 124/80 (BP Location: Right Arm, Patient Position: Sitting, Cuff Size: Normal)   Pulse 80   Resp 16   Ht 5\' 2"  (1.575 m)   Wt 170 lb 12.8 oz (77.5 kg)   BMI 31.24 kg/m     General appearance: alert, cooperative and appears stated  age Head: Normocephalic, without obvious abnormality, atraumatic Neck: no adenopathy, supple, symmetrical, trachea midline and thyroid normal to inspection and palpation Lungs: clear to auscultation bilaterally Breasts: normal appearance, no masses or tenderness, No nipple retraction or dimpling, No nipple discharge or bleeding, No axillary or supraclavicular adenopathy Heart: regular rate and rhythm Abdomen: soft, non-tender; no masses, no organomegaly Extremities: extremities normal, atraumatic, no cyanosis or edema Skin: Skin color, texture, turgor normal. No rashes or lesions Lymph nodes: Cervical, supraclavicular, and axillary nodes normal. No abnormal inguinal nodes palpated Neurologic: Grossly normal  Pelvic: External genitalia:  no lesions              Urethra:  normal appearing urethra with no masses, tenderness or lesions              Bartholins and Skenes: normal                 Vagina: normal appearing vagina with normal color and discharge, no lesions.  Second degree cystocele and first degree rectocele.              Cervix:  Absent.               Pap taken: No. Bimanual Exam:  Uterus:  Absent.              Adnexa: no mass, fullness, tenderness              Rectal exam: Yes.  .  Confirms.              Anus:  normal sphincter tone, no lesions  Chaperone was present for exam.  Assessment:   Well woman visit with normal exam. Status post hysterectomy for endometriosis.  Ovaries remain.  Hx diverticulitis.  Hx PE post op in past.  Cystocele/rectocele. Asymptomatic.  Mood disorder.  Depression symptoms improved on Wellbutrin XL 150 mg.  Osteopenia.  Difficulty with losing weight.   Plan: Mammogram screening discussed.  She will schedule.  Recommended self breast awareness. Pap and HR HPV as above. Guidelines for Calcium, Vitamin D, regular exercise program including cardiovascular and weight bearing exercise. Refill of Wellbutrin XL 150 mg daily.  She will let me know  if she needs to increase the dose. Declines counseling.  Call for difficulty controlling urine, bowel movements, having recurrent UTIs, or any other concern.  BMD next year.  We talked about [facility] at [facility].  Follow up annually and prn.    After visit summary provided.

## 2017-08-20 ENCOUNTER — Ambulatory Visit (INDEPENDENT_AMBULATORY_CARE_PROVIDER_SITE_OTHER): Payer: 59 | Admitting: Obstetrics and Gynecology

## 2017-08-20 ENCOUNTER — Encounter: Payer: Self-pay | Admitting: Obstetrics and Gynecology

## 2017-08-20 VITALS — BP 124/80 | HR 80 | Resp 16 | Ht 62.0 in | Wt 170.8 lb

## 2017-08-20 DIAGNOSIS — Z01419 Encounter for gynecological examination (general) (routine) without abnormal findings: Secondary | ICD-10-CM | POA: Diagnosis not present

## 2017-08-20 MED ORDER — BUPROPION HCL ER (XL) 150 MG PO TB24: 150.0000 mg | ORAL_TABLET | Freq: Every day | ORAL | 3 refills | Status: DC

## 2017-08-20 NOTE — Patient Instructions (Signed)

## 2017-08-27 DIAGNOSIS — L570 Actinic keratosis: Secondary | ICD-10-CM | POA: Diagnosis not present

## 2017-08-27 DIAGNOSIS — D045 Carcinoma in situ of skin of trunk: Secondary | ICD-10-CM | POA: Diagnosis not present

## 2017-09-05 DIAGNOSIS — D045 Carcinoma in situ of skin of trunk: Secondary | ICD-10-CM | POA: Diagnosis not present

## 2017-09-06 ENCOUNTER — Ambulatory Visit: Payer: 59 | Admitting: Certified Nurse Midwife

## 2017-09-17 DIAGNOSIS — Z0000 Encounter for general adult medical examination without abnormal findings: Secondary | ICD-10-CM | POA: Diagnosis not present

## 2017-09-17 DIAGNOSIS — R82998 Other abnormal findings in urine: Secondary | ICD-10-CM | POA: Diagnosis not present

## 2017-09-20 DIAGNOSIS — K5792 Diverticulitis of intestine, part unspecified, without perforation or abscess without bleeding: Secondary | ICD-10-CM | POA: Diagnosis not present

## 2017-09-20 DIAGNOSIS — E7849 Other hyperlipidemia: Secondary | ICD-10-CM | POA: Diagnosis not present

## 2017-09-20 DIAGNOSIS — R0789 Other chest pain: Secondary | ICD-10-CM | POA: Diagnosis not present

## 2017-09-20 DIAGNOSIS — Z0000 Encounter for general adult medical examination without abnormal findings: Secondary | ICD-10-CM | POA: Diagnosis not present

## 2017-09-24 DIAGNOSIS — Z1212 Encounter for screening for malignant neoplasm of rectum: Secondary | ICD-10-CM | POA: Diagnosis not present

## 2017-12-04 DIAGNOSIS — M1711 Unilateral primary osteoarthritis, right knee: Secondary | ICD-10-CM | POA: Diagnosis not present

## 2017-12-04 DIAGNOSIS — S83241A Other tear of medial meniscus, current injury, right knee, initial encounter: Secondary | ICD-10-CM | POA: Diagnosis not present

## 2017-12-10 DIAGNOSIS — Z85828 Personal history of other malignant neoplasm of skin: Secondary | ICD-10-CM | POA: Diagnosis not present

## 2017-12-10 DIAGNOSIS — L821 Other seborrheic keratosis: Secondary | ICD-10-CM | POA: Diagnosis not present

## 2017-12-10 DIAGNOSIS — L570 Actinic keratosis: Secondary | ICD-10-CM | POA: Diagnosis not present

## 2018-01-23 DIAGNOSIS — Z09 Encounter for follow-up examination after completed treatment for conditions other than malignant neoplasm: Secondary | ICD-10-CM | POA: Diagnosis not present

## 2018-01-23 DIAGNOSIS — Z9889 Other specified postprocedural states: Secondary | ICD-10-CM | POA: Diagnosis not present

## 2018-01-23 DIAGNOSIS — M75121 Complete rotator cuff tear or rupture of right shoulder, not specified as traumatic: Secondary | ICD-10-CM | POA: Diagnosis not present

## 2018-04-03 DIAGNOSIS — M48061 Spinal stenosis, lumbar region without neurogenic claudication: Secondary | ICD-10-CM | POA: Diagnosis not present

## 2018-04-03 DIAGNOSIS — M5136 Other intervertebral disc degeneration, lumbar region: Secondary | ICD-10-CM | POA: Diagnosis not present

## 2018-04-03 DIAGNOSIS — M4726 Other spondylosis with radiculopathy, lumbar region: Secondary | ICD-10-CM | POA: Diagnosis not present

## 2018-05-15 DIAGNOSIS — C44722 Squamous cell carcinoma of skin of right lower limb, including hip: Secondary | ICD-10-CM | POA: Diagnosis not present

## 2018-05-15 DIAGNOSIS — L820 Inflamed seborrheic keratosis: Secondary | ICD-10-CM | POA: Diagnosis not present

## 2018-05-15 DIAGNOSIS — D2371 Other benign neoplasm of skin of right lower limb, including hip: Secondary | ICD-10-CM | POA: Diagnosis not present

## 2018-05-15 DIAGNOSIS — D2271 Melanocytic nevi of right lower limb, including hip: Secondary | ICD-10-CM | POA: Diagnosis not present

## 2018-05-15 DIAGNOSIS — L814 Other melanin hyperpigmentation: Secondary | ICD-10-CM | POA: Diagnosis not present

## 2018-05-15 DIAGNOSIS — D485 Neoplasm of uncertain behavior of skin: Secondary | ICD-10-CM | POA: Diagnosis not present

## 2018-05-29 DIAGNOSIS — C44722 Squamous cell carcinoma of skin of right lower limb, including hip: Secondary | ICD-10-CM | POA: Diagnosis not present

## 2018-05-29 DIAGNOSIS — L820 Inflamed seborrheic keratosis: Secondary | ICD-10-CM | POA: Diagnosis not present

## 2018-07-02 DIAGNOSIS — M5136 Other intervertebral disc degeneration, lumbar region: Secondary | ICD-10-CM | POA: Diagnosis not present

## 2018-07-02 DIAGNOSIS — R030 Elevated blood-pressure reading, without diagnosis of hypertension: Secondary | ICD-10-CM | POA: Diagnosis not present

## 2018-07-02 DIAGNOSIS — M5416 Radiculopathy, lumbar region: Secondary | ICD-10-CM | POA: Diagnosis not present

## 2018-07-04 DIAGNOSIS — M48061 Spinal stenosis, lumbar region without neurogenic claudication: Secondary | ICD-10-CM | POA: Diagnosis not present

## 2018-07-04 DIAGNOSIS — M5136 Other intervertebral disc degeneration, lumbar region: Secondary | ICD-10-CM | POA: Diagnosis not present

## 2018-07-04 DIAGNOSIS — M4726 Other spondylosis with radiculopathy, lumbar region: Secondary | ICD-10-CM | POA: Diagnosis not present

## 2018-07-16 DIAGNOSIS — D1801 Hemangioma of skin and subcutaneous tissue: Secondary | ICD-10-CM | POA: Diagnosis not present

## 2018-07-16 DIAGNOSIS — L821 Other seborrheic keratosis: Secondary | ICD-10-CM | POA: Diagnosis not present

## 2018-07-16 DIAGNOSIS — Z85828 Personal history of other malignant neoplasm of skin: Secondary | ICD-10-CM | POA: Diagnosis not present

## 2018-07-24 DIAGNOSIS — L570 Actinic keratosis: Secondary | ICD-10-CM | POA: Diagnosis not present

## 2018-07-24 DIAGNOSIS — C44722 Squamous cell carcinoma of skin of right lower limb, including hip: Secondary | ICD-10-CM | POA: Diagnosis not present

## 2018-07-24 DIAGNOSIS — J209 Acute bronchitis, unspecified: Secondary | ICD-10-CM | POA: Diagnosis not present

## 2018-07-24 DIAGNOSIS — J45998 Other asthma: Secondary | ICD-10-CM | POA: Diagnosis not present

## 2018-07-24 DIAGNOSIS — Z6827 Body mass index (BMI) 27.0-27.9, adult: Secondary | ICD-10-CM | POA: Diagnosis not present

## 2018-08-28 ENCOUNTER — Ambulatory Visit: Payer: 59 | Admitting: Obstetrics and Gynecology

## 2018-08-28 ENCOUNTER — Encounter: Payer: Self-pay | Admitting: Obstetrics and Gynecology

## 2018-08-28 ENCOUNTER — Other Ambulatory Visit: Payer: Self-pay | Admitting: Obstetrics and Gynecology

## 2018-08-28 VITALS — BP 132/78 | HR 78 | Ht 62.0 in | Wt 154.0 lb

## 2018-08-28 DIAGNOSIS — Z1231 Encounter for screening mammogram for malignant neoplasm of breast: Secondary | ICD-10-CM

## 2018-08-28 DIAGNOSIS — Z1239 Encounter for other screening for malignant neoplasm of breast: Secondary | ICD-10-CM

## 2018-08-28 DIAGNOSIS — Z01419 Encounter for gynecological examination (general) (routine) without abnormal findings: Secondary | ICD-10-CM | POA: Diagnosis not present

## 2018-08-28 MED ORDER — BUPROPION HCL ER (XL) 150 MG PO TB24: 150.0000 mg | ORAL_TABLET | Freq: Every day | ORAL | 3 refills | Status: DC

## 2018-08-28 NOTE — Patient Instructions (Signed)

## 2018-08-28 NOTE — Progress Notes (Signed)
72 y.o. G3P3 Married Caucasian female here for annual exam.    Doing well on Wellbutrin overall.  Sometimes she feels queasy when she takes it.  Tried to do higher dose of 300 and she had more nausea.  She states she felt blue but not depressed prior to taking it.   Loosing weight and doing a program called [employer].   Taking Miralax for constipation.  Bladder control is doing well but not perfect per patient.   Has yearly physical with PCP in December.   PCP:  Crist Infante, MD   No LMP recorded. Patient has had a hysterectomy.           Sexually active: Yes.    The current method of family planning is status post hysterectomy.    Exercising: Yes.    Home exercise routine includes 2-3 days a week. Smoker:  no  Health Maintenance: Pap:  2002 Neg History of abnormal Pap:  no MMG: 08-23-16 Density B/Neg/BiRads1 Colonoscopy:  12-04-13 polyps with Dr.Perry;next due 2020 BMD: 08-29-16  Result :Osteopenia of hip TDaP:  11-10-13 Gardasil:   no HIV: no Hep C: no Screening Labs:  PCP.    reports that she has never smoked. She has never used smokeless tobacco. She reports that she drinks alcohol. She reports that she does not use drugs.  Past Medical History:  Diagnosis Date  . Arthritis   . Benign neoplasm of colon   . Complication of anesthesia    anectine - caused her to have difficulty waking up and was on vent for few hours post op  . Diverticulitis   . Diverticulosis of colon (without mention of hemorrhage)   . Endometriosis    reason for hysterectomy.  ovaries remain.   . Esophageal reflux    few times per month not routine  . Esophagitis, unspecified   . Headache(784.0)   . Osteopenia   . Other and unspecified hyperlipidemia   . Other and unspecified hyperlipidemia   . Other constipation   . Palpitations   . Personal history of other mental disorder   . PONV (postoperative nausea and vomiting)   . Pulmonary embolism ([place])    2006    Past Surgical History:   Procedure Laterality Date  . ABDOMINAL HYSTERECTOMY    . ABDOMINOPLASTY    . APPENDECTOMY    . CARDIAC CATHETERIZATION     09/06/10: normal coronaries and LV function (saw Dr. Rollene Fare in 2011)  . COLONOSCOPY    . JOINT REPLACEMENT     knee replacement  . LIPOSUCTION    . LUMBAR FUSION  March 2014  . LUMBAR LAMINECTOMY    . PARTIAL HYSTERECTOMY    . SHOULDER ARTHROSCOPY WITH ROTATOR CUFF REPAIR AND SUBACROMIAL DECOMPRESSION Right 02/26/2015   Procedure: RIGHT SHOULDER ARTHROSCOPY DEBRIDEMENT WITH SUBACROMIAL DECOMPRESSION, , ROTATOR CUFF REPAIR ;  Surgeon: Elsie Saas, MD;  Location: [facility];  Service: Orthopedics;  Laterality: Right;  . TOTAL KNEE ARTHROPLASTY  2010   left  . TUBAL LIGATION      Current Outpatient Medications  Medication Sig Dispense Refill  . atorvastatin (LIPITOR) 10 MG tablet Take 10 mg by mouth daily.    Marland Kitchen buPROPion (WELLBUTRIN XL) 150 MG 24 hr tablet Take 1 tablet (150 mg total) by mouth daily. 90 tablet 3  . docusate sodium (COLACE) 100 MG capsule Take 100 mg by mouth at bedtime.    Marland Kitchen ezetimibe (ZETIA) 10 MG tablet Take 10 mg by mouth daily.    Marland Kitchen  pantoprazole (PROTONIX) 20 MG tablet Take 1 tablet (20 mg total) by mouth daily as needed. Acid reflux. 10 tablet 0  . polyethylene glycol (MIRALAX / GLYCOLAX) packet Take 17 g by mouth daily.     No current facility-administered medications for this visit.     Family History  Problem Relation Age of Onset  . Tics Father   . Lung cancer Father   . Breast cancer Mother   . Diabetes Mother   . Diabetes Paternal Grandmother   . Colon cancer Neg Hx   . Esophageal cancer Neg Hx   . Rectal cancer Neg Hx   . Stomach cancer Neg Hx     Review of Systems  Constitutional: Negative.   HENT: Negative.   Eyes: Negative.   Respiratory: Negative.   Cardiovascular: Negative.   Gastrointestinal: Positive for constipation.  Endocrine: Negative.   Genitourinary: Negative.   Musculoskeletal:  Negative.   Skin: Negative.   Allergic/Immunologic: Negative.   Neurological: Positive for headaches.  Hematological: Negative.   Psychiatric/Behavioral: Negative.   All other systems reviewed and are negative.   Exam:   BP 132/78   Pulse 78   Ht 5\' 2"  (1.575 m)   Wt 154 lb (69.9 kg)   SpO2 99%   BMI 28.17 kg/m     General appearance: alert, cooperative and appears stated age Head: Normocephalic, without obvious abnormality, atraumatic Neck: no adenopathy, supple, symmetrical, trachea midline and thyroid normal to inspection and palpation Lungs: clear to auscultation bilaterally Breasts: normal appearance, no masses or tenderness, No nipple retraction or dimpling, No nipple discharge or bleeding, No axillary or supraclavicular adenopathy Heart: regular rate and rhythm Abdomen: soft, non-tender; no masses, no organomegaly Extremities: extremities normal, atraumatic, no cyanosis or edema Skin: Skin color, texture, turgor normal.  Multiple nevi and seborrheic keratosis areas. Lymph nodes: Cervical, supraclavicular, and axillary nodes normal. No abnormal inguinal nodes palpated Neurologic: Grossly normal  Pelvic: External genitalia:  no lesions              Urethra:  normal appearing urethra with no masses, tenderness or lesions              Bartholins and Skenes: normal                 Vagina: normal appearing vagina with normal color and discharge, no lesions.  Bladder and vaginal apex well supported.  Second degree rectocele.              Cervix:  absent              Pap taken: No. Bimanual Exam:  Uterus:   absent              Adnexa: no mass, fullness, tenderness              Rectal exam: Yes.  .  Confirms.              Anus:  normal sphincter tone, no lesions  Chaperone was present for exam.  Assessment:   Well woman visit with normal exam. Status post hysterectomy for endometriosis. Ovaries remain.  Hx diverticulitis.  Hx PE post op in past.  Rectocele.  Mood  disorder. Depression symptoms improved on Wellbutrin XL 150 mg.  Osteopenia.   Plan: Mammogram screening.  We will schedule.  Recommended self breast awareness. Pap and HR HPV as above. Guidelines for Calcium, Vitamin D, regular exercise program including cardiovascular and weight bearing exercise. Will refill Wellbutrin 150 mc  XL.  90 day with 3 refills.  She declines changing the dosage.  Labs with PCP.  BMD with PCP.  We discussed her rectocele. Use Miralax and avoid straining.  Return if symptoms are progressive.   Follow up annually and prn.   After visit summary provided.

## 2018-09-12 DIAGNOSIS — M4726 Other spondylosis with radiculopathy, lumbar region: Secondary | ICD-10-CM | POA: Diagnosis not present

## 2018-09-12 DIAGNOSIS — M5136 Other intervertebral disc degeneration, lumbar region: Secondary | ICD-10-CM | POA: Diagnosis not present

## 2018-09-12 DIAGNOSIS — M48061 Spinal stenosis, lumbar region without neurogenic claudication: Secondary | ICD-10-CM | POA: Diagnosis not present

## 2018-10-04 ENCOUNTER — Ambulatory Visit
Admission: RE | Admit: 2018-10-04 | Discharge: 2018-10-04 | Disposition: A | Discharge status: Home / Self Care | Payer: 59 | Source: Ambulatory Visit | Attending: Obstetrics and Gynecology | Admitting: Obstetrics and Gynecology

## 2018-10-04 DIAGNOSIS — Z1231 Encounter for screening mammogram for malignant neoplasm of breast: Secondary | ICD-10-CM

## 2018-10-22 DIAGNOSIS — Z0000 Encounter for general adult medical examination without abnormal findings: Secondary | ICD-10-CM | POA: Diagnosis not present

## 2018-10-22 DIAGNOSIS — R82998 Other abnormal findings in urine: Secondary | ICD-10-CM | POA: Diagnosis not present

## 2018-10-29 DIAGNOSIS — Z0000 Encounter for general adult medical examination without abnormal findings: Secondary | ICD-10-CM | POA: Diagnosis not present

## 2018-10-29 DIAGNOSIS — J45998 Other asthma: Secondary | ICD-10-CM | POA: Diagnosis not present

## 2018-10-29 DIAGNOSIS — K5792 Diverticulitis of intestine, part unspecified, without perforation or abscess without bleeding: Secondary | ICD-10-CM | POA: Diagnosis not present

## 2018-10-29 DIAGNOSIS — Z1389 Encounter for screening for other disorder: Secondary | ICD-10-CM | POA: Diagnosis not present

## 2018-10-29 DIAGNOSIS — M859 Disorder of bone density and structure, unspecified: Secondary | ICD-10-CM | POA: Diagnosis not present

## 2018-10-29 DIAGNOSIS — R0789 Other chest pain: Secondary | ICD-10-CM | POA: Diagnosis not present

## 2018-10-30 DIAGNOSIS — Z1212 Encounter for screening for malignant neoplasm of rectum: Secondary | ICD-10-CM | POA: Diagnosis not present

## 2018-11-07 ENCOUNTER — Other Ambulatory Visit: Payer: Self-pay | Admitting: Obstetrics and Gynecology

## 2018-11-26 DIAGNOSIS — S83241A Other tear of medial meniscus, current injury, right knee, initial encounter: Secondary | ICD-10-CM | POA: Diagnosis not present

## 2018-11-26 DIAGNOSIS — Z96652 Presence of left artificial knee joint: Secondary | ICD-10-CM | POA: Diagnosis not present

## 2018-12-02 DIAGNOSIS — M2011 Hallux valgus (acquired), right foot: Secondary | ICD-10-CM | POA: Diagnosis not present

## 2019-03-27 DIAGNOSIS — M5116 Intervertebral disc disorders with radiculopathy, lumbar region: Secondary | ICD-10-CM | POA: Diagnosis not present

## 2019-03-27 DIAGNOSIS — M4726 Other spondylosis with radiculopathy, lumbar region: Secondary | ICD-10-CM | POA: Diagnosis not present

## 2019-03-27 DIAGNOSIS — M48061 Spinal stenosis, lumbar region without neurogenic claudication: Secondary | ICD-10-CM | POA: Diagnosis not present

## 2019-07-15 ENCOUNTER — Other Ambulatory Visit: Payer: Self-pay | Admitting: Neurosurgery

## 2019-07-15 DIAGNOSIS — M48062 Spinal stenosis, lumbar region with neurogenic claudication: Secondary | ICD-10-CM

## 2019-08-07 ENCOUNTER — Ambulatory Visit
Admission: RE | Admit: 2019-08-07 | Discharge: 2019-08-07 | Disposition: A | Discharge status: Home / Self Care | Payer: 59 | Source: Ambulatory Visit | Attending: Neurosurgery | Admitting: Neurosurgery

## 2019-08-07 ENCOUNTER — Other Ambulatory Visit: Payer: Self-pay

## 2019-08-07 DIAGNOSIS — M48062 Spinal stenosis, lumbar region with neurogenic claudication: Secondary | ICD-10-CM

## 2019-08-07 MED ORDER — GADOBENATE DIMEGLUMINE 529 MG/ML IV SOLN
15.0000 mL | Freq: Once | INTRAVENOUS | Status: AC | PRN
  Administered 2019-08-07: 15 mL via INTRAVENOUS

## 2019-09-01 ENCOUNTER — Ambulatory Visit: Payer: 59 | Admitting: Obstetrics and Gynecology

## 2019-09-10 ENCOUNTER — Ambulatory Visit: Payer: 59 | Admitting: Internal Medicine

## 2019-09-10 ENCOUNTER — Encounter: Payer: Self-pay | Admitting: Internal Medicine

## 2019-09-10 ENCOUNTER — Other Ambulatory Visit: Payer: Self-pay | Admitting: Internal Medicine

## 2019-09-10 VITALS — BP 120/84 | HR 88 | Temp 98.4°F | Ht 61.75 in | Wt 180.0 lb

## 2019-09-10 DIAGNOSIS — R110 Nausea: Secondary | ICD-10-CM | POA: Diagnosis not present

## 2019-09-10 DIAGNOSIS — K219 Gastro-esophageal reflux disease without esophagitis: Secondary | ICD-10-CM | POA: Diagnosis not present

## 2019-09-10 DIAGNOSIS — Z1231 Encounter for screening mammogram for malignant neoplasm of breast: Secondary | ICD-10-CM

## 2019-09-10 DIAGNOSIS — R109 Unspecified abdominal pain: Secondary | ICD-10-CM | POA: Diagnosis not present

## 2019-09-10 MED ORDER — PANTOPRAZOLE SODIUM 40 MG PO TBEC: 40.0000 mg | DELAYED_RELEASE_TABLET | Freq: Two times a day (BID) | ORAL | 6 refills | Status: DC

## 2019-09-10 NOTE — Patient Instructions (Signed)
We have sent the following medications to your pharmacy for you to pick up at your convenience:   Pantoprazole  You have been scheduled for an abdominal ultrasound at [facility] (1st floor of hospital) on 09/15/2019 at 8:00am. Please arrive 15 minutes prior to your appointment for registration. Make certain not to have anything to eat or drink 6 hours prior to your appointment. Should you need to reschedule your appointment, please contact radiology at [phone]. This test typically takes about 30 minutes to perform.   You have been scheduled for an endoscopy. Please follow written instructions given to you at your visit today. If you use inhalers (even only as needed), please bring them with you on the day of your procedure.

## 2019-09-10 NOTE — Progress Notes (Signed)
HISTORY OF PRESENT ILLNESS:  Lisa Moody is a 73 y.o. female with past medical history as listed below who has a history of GERD, diverticulosis complicated by diverticulitis, and adenomatous colon polyps.  She presents today for evaluation of nausea.  She reports about a 6-week history of nausea or queasiness.  This may occur at any time during the day but not while sleeping.  Often exacerbated by certain activities such as driving in the car or riding on a boat.  She does notice that eating, particularly something salty, seems to help.  She has had significant weight gain over the past 6 months.  She does take pantoprazole 40 mg daily for GERD.  She has experienced some breakthrough GERD symptoms.  For the most part she does not have abdominal pain though describes 2 episodes of abdominal cramping followed by the need to defecate.  No diarrhea.  No fevers.  She denies any problems with inner ear.  She denies dizziness, vertigo, or change in headache pattern.  She did undergo upper endoscopy in January 2009.  Reflux esophagitis noted.  Her last colonoscopy was January 2015 with tubular adenomas.  Follow-up in 5 years recommended.  She did vomit on one occasion.  No regurgitation issues for the most part.  She has been under significant stress.  Her medications are stable and without change.  Review of laboratory and x-ray file shows no recent relevant abnormalities  REVIEW OF SYSTEMS:  All non-GI ROS negative unless otherwise stated in the HPI except for back pain  Past Medical History:  Diagnosis Date  . Arthritis   . Benign neoplasm of colon   . Complication of anesthesia    anectine - caused her to have difficulty waking up and was on vent for few hours post op  . Diverticulitis   . Diverticulosis of colon (without mention of hemorrhage)   . Endometriosis    reason for hysterectomy.  ovaries remain.   . Esophageal reflux    few times per month not routine  . Esophagitis, unspecified   .  Headache(784.0)   . Osteopenia   . Other and unspecified hyperlipidemia   . Other and unspecified hyperlipidemia   . Other constipation   . Palpitations   . Personal history of other mental disorder   . PONV (postoperative nausea and vomiting)   . Pulmonary embolism ([place])    2006    Past Surgical History:  Procedure Laterality Date  . ABDOMINAL HYSTERECTOMY    . ABDOMINOPLASTY    . APPENDECTOMY    . CARDIAC CATHETERIZATION     09/06/10: normal coronaries and LV function (saw Dr. Rollene Fare in 2011)  . COLONOSCOPY    . JOINT REPLACEMENT     knee replacement  . LIPOSUCTION    . LUMBAR FUSION  March 2014  . LUMBAR LAMINECTOMY    . PARTIAL HYSTERECTOMY    . SHOULDER ARTHROSCOPY WITH ROTATOR CUFF REPAIR AND SUBACROMIAL DECOMPRESSION Right 02/26/2015   Procedure: RIGHT SHOULDER ARTHROSCOPY DEBRIDEMENT WITH SUBACROMIAL DECOMPRESSION, , ROTATOR CUFF REPAIR ;  Surgeon: Elsie Saas, MD;  Location: [place];  Service: Orthopedics;  Laterality: Right;  . TOTAL KNEE ARTHROPLASTY  2010   left  . TUBAL LIGATION      Social History DAWNA MARTINE  reports that she has never smoked. She has never used smokeless tobacco. She reports current alcohol use. She reports that she does not use drugs.  family history includes Breast cancer in her mother; Diabetes  in her mother and paternal grandmother; Lung cancer in her father; Tics in her father.  Allergies  Allergen Reactions  . Anectine  [Succinylcholine Chloride] Anaphylaxis  . Succinylcholine     Respiratory suppression   . Multi Vitamin-Fluoride [Multivitamins-Fluoride] Nausea Only       PHYSICAL EXAMINATION: Vital signs: BP 120/84 (BP Location: Left Arm, Patient Position: Sitting, Cuff Size: Normal)   Pulse 88   Temp 98.4 F (36.9 C)   Ht 5' 1.75" (1.568 m) Comment: height measured without shoes  Wt 180 lb (81.6 kg)   BMI 33.19 kg/m   Constitutional: generally well-appearing, no acute distress Psychiatric:  alert and oriented x3, cooperative Eyes: extraocular movements intact, anicteric, conjunctiva pink Mouth: oral pharynx moist, no lesions Neck: supple no lymphadenopathy Cardiovascular: heart regular rate and rhythm, no murmur Lungs: clear to auscultation bilaterally Abdomen: soft, nontender, nondistended, no obvious ascites, no peritoneal signs, normal bowel sounds, no organomegaly Rectal: Omitted Extremities: no clubbing, cyanosis, or lower extremity edema bilaterally Skin: no lesions on visible extremities Neuro: No focal deficits.  Cranial nerves intact  ASSESSMENT:  1.  6-week history of nausea without vomiting.  Possible GI etiologies include GERD exacerbated by weight gain, peptic ulcer disease, idiopathic gastroparesis, occult biliary disease.  Possible non-GI issues include functional such as stress or possibly vestibular with the motion component as described. 2.  GERD.  Some breakthrough symptoms despite once daily PPI.  Likely exacerbated by weight gain 3.  History of adenomatous colon polyps.  Due for surveillance 4.  History of diverticulitis.   PLAN:  1.  Increase pantoprazole to 40 mg twice daily 2.  Pantoprazole 40 mg twice daily prescribed 3.  Zofran 4 mg every 4 hours as needed for nausea prescribed with refills 4.  Schedule upper endoscopy to evaluate symptoms.The nature of the procedure, as well as the risks, benefits, and alternatives were carefully and thoroughly reviewed with the patient. Ample time for discussion and questions allowed. The patient understood, was satisfied, and agreed to proceed. 5.  Schedule ultrasound to evaluate episodes of pain in chronic nausea 6.  Surveillance colonoscopy.  This may be scheduled with endoscopy or at another time.  I will leave this up to the patient.The nature of the procedure, as well as the risks, benefits, and alternatives were carefully and thoroughly reviewed with the patient. Ample time for discussion and questions  allowed. The patient understood, was satisfied, and agreed to proceed. 7.  Reflux precautions with attention to weight loss

## 2019-09-11 ENCOUNTER — Ambulatory Visit: Payer: 59 | Admitting: Internal Medicine

## 2019-09-12 ENCOUNTER — Encounter: Payer: Self-pay | Admitting: Internal Medicine

## 2019-09-15 ENCOUNTER — Other Ambulatory Visit: Payer: Self-pay | Admitting: Obstetrics and Gynecology

## 2019-09-15 ENCOUNTER — Ambulatory Visit (HOSPITAL_COMMUNITY): Payer: 59

## 2019-09-15 ENCOUNTER — Telehealth: Payer: Self-pay

## 2019-09-15 NOTE — Telephone Encounter (Signed)
Covid-19 screening questions   Do you now or have you had a fever in the last 14 days? NO   Do you have any respiratory symptoms of shortness of breath or cough now or in the last 14 days? NO  Do you have any family members or close contacts with diagnosed or suspected Covid-19 in the past 14 days? NO  Have you been tested for Covid-19 and found to be positive? NO        

## 2019-09-16 ENCOUNTER — Other Ambulatory Visit: Payer: Self-pay

## 2019-09-16 ENCOUNTER — Ambulatory Visit (AMBULATORY_SURGERY_CENTER): Payer: 59 | Admitting: Internal Medicine

## 2019-09-16 ENCOUNTER — Other Ambulatory Visit (INDEPENDENT_AMBULATORY_CARE_PROVIDER_SITE_OTHER): Payer: 59

## 2019-09-16 ENCOUNTER — Encounter: Payer: Self-pay | Admitting: Internal Medicine

## 2019-09-16 ENCOUNTER — Telehealth: Payer: Self-pay | Admitting: Internal Medicine

## 2019-09-16 ENCOUNTER — Other Ambulatory Visit: Payer: Self-pay | Admitting: Gastroenterology

## 2019-09-16 VITALS — BP 151/87 | HR 71 | Temp 98.4°F | Resp 16 | Ht 61.0 in | Wt 180.0 lb

## 2019-09-16 DIAGNOSIS — K219 Gastro-esophageal reflux disease without esophagitis: Secondary | ICD-10-CM

## 2019-09-16 DIAGNOSIS — R109 Unspecified abdominal pain: Secondary | ICD-10-CM | POA: Diagnosis not present

## 2019-09-16 DIAGNOSIS — R110 Nausea: Secondary | ICD-10-CM

## 2019-09-16 DIAGNOSIS — K2100 Gastro-esophageal reflux disease with esophagitis, without bleeding: Secondary | ICD-10-CM

## 2019-09-16 LAB — CBC WITH DIFFERENTIAL/PLATELET
Basophils Absolute: 0 10*3/uL (ref 0.0–0.1)
Basophils Relative: 0.9 % (ref 0.0–3.0)
Eosinophils Absolute: 0.2 10*3/uL (ref 0.0–0.7)
Eosinophils Relative: 4.1 % (ref 0.0–5.0)
HCT: 40.1 % (ref 36.0–46.0)
Hemoglobin: 13 g/dL (ref 12.0–15.0)
Lymphocytes Relative: 33.7 % (ref 12.0–46.0)
Lymphs Abs: 1.3 10*3/uL (ref 0.7–4.0)
MCHC: 32.3 g/dL (ref 30.0–36.0)
MCV: 91.9 fl (ref 78.0–100.0)
Monocytes Absolute: 0.4 10*3/uL (ref 0.1–1.0)
Monocytes Relative: 10.5 % (ref 3.0–12.0)
Neutro Abs: 2 10*3/uL (ref 1.4–7.7)
Neutrophils Relative %: 50.8 % (ref 43.0–77.0)
Platelets: 195 10*3/uL (ref 150.0–400.0)
RBC: 4.37 Mil/uL (ref 3.87–5.11)
RDW: 13.5 % (ref 11.5–15.5)
WBC: 3.9 10*3/uL — ABNORMAL LOW (ref 4.0–10.5)

## 2019-09-16 LAB — COMPREHENSIVE METABOLIC PANEL
ALT: 20 U/L (ref 0–35)
AST: 20 U/L (ref 0–37)
Albumin: 3.9 g/dL (ref 3.5–5.2)
Alkaline Phosphatase: 68 U/L (ref 39–117)
BUN: 16 mg/dL (ref 6–23)
CO2: 27 mEq/L (ref 19–32)
Calcium: 9.1 mg/dL (ref 8.4–10.5)
Chloride: 109 mEq/L (ref 96–112)
Creatinine, Ser: 0.49 mg/dL (ref 0.40–1.20)
GFR: 124.17 mL/min (ref >60.00)
Glucose, Bld: 95 mg/dL (ref 70–99)
Potassium: 3.6 mEq/L (ref 3.5–5.1)
Sodium: 142 mEq/L (ref 135–145)
Total Bilirubin: 0.9 mg/dL (ref 0.2–1.2)
Total Protein: 6 g/dL (ref 6.0–8.3)

## 2019-09-16 MED ORDER — SODIUM CHLORIDE 0.9 % IV SOLN: 500.0000 mL | Freq: Once | INTRAVENOUS | Status: DC

## 2019-09-16 NOTE — Patient Instructions (Signed)
YOU HAD AN ENDOSCOPIC PROCEDURE TODAY AT [facility]:   Refer to the procedure report that was given to you for any specific questions about what was found during the examination.  If the procedure report does not answer your questions, please call your gastroenterologist to clarify.  If you requested that your care partner not be given the details of your procedure findings, then the procedure report has been included in a sealed envelope for you to review at your convenience later.  YOU SHOULD EXPECT: Some feelings of bloating in the abdomen. Passage of more gas than usual.  Walking can help get rid of the air that was put into your GI tract during the procedure and reduce the bloating. If you had a lower endoscopy (such as a colonoscopy or flexible sigmoidoscopy) you may notice spotting of blood in your stool or on the toilet paper. If you underwent a bowel prep for your procedure, you may not have a normal bowel movement for a few days.  Please Note:  You might notice some irritation and congestion in your nose or some drainage.  This is from the oxygen used during your procedure.  There is no need for concern and it should clear up in a day or so.  SYMPTOMS TO REPORT IMMEDIATELY:   Following upper endoscopy (EGD)  Vomiting of blood or coffee ground material  New chest pain or pain under the shoulder blades  Painful or persistently difficult swallowing  New shortness of breath  Fever of 100F or higher  Black, tarry-looking stools  For urgent or emergent issues, a gastroenterologist can be reached at any hour by calling [phone].   DIET:  We do recommend a small meal at first, but then you may proceed to your regular diet.  Drink plenty of fluids but you should avoid alcoholic beverages for 24 hours.  MEDICATIONS: Continue Pantoprazole TWICE daily.  Follow Reflux precautions (see handout given to you by your recovery nurse).  Keep your appointment for your  ultrasound tomorrow.  FOLLOW UP with Dr. Henrene Pastor in his office for an appointment in 4 weeks.  ACTIVITY:  You should plan to take it easy for the rest of today and you should NOT DRIVE or use heavy machinery until tomorrow (because of the sedation medicines used during the test).    FOLLOW UP: Our staff will call the number listed on your records 48-72 hours following your procedure to check on you and address any questions or concerns that you may have regarding the information given to you following your procedure. If we do not reach you, we will leave a message.  We will attempt to reach you two times.  During this call, we will ask if you have developed any symptoms of COVID 19. If you develop any symptoms (ie: fever, flu-like symptoms, shortness of breath, cough etc.) before then, please call [phone].  If you test positive for Covid 19 in the 2 weeks post procedure, please call and report this information to [place].    If any biopsies were taken you will be contacted by phone or by letter within the next 1-3 weeks.  Please call us at [phone] if you have not heard about the biopsies in 3 weeks.   Thank you for allowing [place] to provide for your healthcare needs today.   SIGNATURES/CONFIDENTIALITY: You and/or your care partner have signed paperwork which will be entered into your electronic medical record.  These signatures attest to the fact  that that the information above on your After Visit Summary has been reviewed and is understood.  Full responsibility of the confidentiality of this discharge information lies with you and/or your care-partner.

## 2019-09-16 NOTE — Progress Notes (Signed)
Report to PACU, RN, vss, BBS= Clear.  

## 2019-09-16 NOTE — Op Note (Signed)
[facility] Patient Name: Lisa Moody Procedure Date: 09/16/2019 10:14 AM MRN: [id number] Endoscopist: Docia Chuck. Henrene Pastor , MD Age: 72 Referring MD:  Date of Birth: [birth year] Gender: Female Account #: [id number] Procedure:                Upper GI endoscopy Indications:              Abdominal pain, Esophageal reflux (despite once                            daily PPI therapy), Nausea Medicines:                Monitored Anesthesia Care Procedure:                Pre-Anesthesia Assessment:                           - Prior to the procedure, a History and Physical                            was performed, and patient medications and                            allergies were reviewed. The patient's tolerance of                            previous anesthesia was also reviewed. The risks                            and benefits of the procedure and the sedation                            options and risks were discussed with the patient.                            All questions were answered, and informed consent                            was obtained. Prior Anticoagulants: The patient has                            taken no previous anticoagulant or antiplatelet                            agents. ASA Grade Assessment: II - A patient with                            mild systemic disease. After reviewing the risks                            and benefits, the patient was deemed in                            satisfactory condition to undergo the procedure.  After obtaining informed consent, the endoscope was                            passed under direct vision. Throughout the                            procedure, the patient's blood pressure, pulse, and                            oxygen saturations were monitored continuously. The                            Endoscope was introduced through the mouth, and                            advanced to the second part of  duodenum. The upper                            GI endoscopy was accomplished without difficulty.                            The patient tolerated the procedure well. Scope In: Scope Out: Findings:                 The esophagus revealed mild esophagitis as                            manifested by hyperemia and edema at the mucosal                            Z-line.                           The stomach was normal.                           The examined duodenum was normal.                           The cardia and gastric fundus were normal on                            retroflexion. Complications:            No immediate complications. Estimated Blood Loss:     Estimated blood loss: none. Impression:               1. GERD with mild esophagitis.                           2. Otherwise normal EGD. Recommendation:           1. Continue pantoprazole TWICE daily                           2. Reflux precautions (please review handout)  3. CBC, comprehensive metabolic panel, and TSH today                           4. Keep appointment for ultrasound tomorrow                           5. Office follow-up with Dr. Henrene Pastor in 4 weeks. Docia Chuck. Henrene Pastor, MD 09/16/2019 10:34:51 AM This report has been signed electronically.

## 2019-09-16 NOTE — Telephone Encounter (Signed)
Pt requested to speak to you regarding ondansetron prescription.

## 2019-09-17 ENCOUNTER — Other Ambulatory Visit: Payer: Self-pay

## 2019-09-17 ENCOUNTER — Ambulatory Visit (HOSPITAL_COMMUNITY)
Admission: RE | Admit: 2019-09-17 | Discharge: 2019-09-17 | Disposition: A | Discharge status: Home / Self Care | Payer: 59 | Source: Ambulatory Visit | Attending: Internal Medicine | Admitting: Internal Medicine

## 2019-09-17 DIAGNOSIS — R110 Nausea: Secondary | ICD-10-CM | POA: Diagnosis not present

## 2019-09-17 MED ORDER — ONDANSETRON HCL 4 MG PO TABS: 4.0000 mg | ORAL_TABLET | ORAL | 3 refills | Status: DC | PRN

## 2019-09-17 NOTE — Telephone Encounter (Signed)
Spoke with patient - Zofran did not make it to the pharmacy.  I told her I would send it right now.  Patient agreed.

## 2019-09-18 ENCOUNTER — Telehealth: Payer: Self-pay

## 2019-09-18 LAB — TSH: TSH: 2.06 u[IU]/mL (ref 0.35–4.50)

## 2019-09-18 NOTE — Telephone Encounter (Signed)
Pt aware.

## 2019-09-18 NOTE — Telephone Encounter (Signed)
  Follow up Call-  Call back number 09/16/2019  Post procedure Call Back phone  # [phone]  Permission to leave phone message Yes  Some recent data might be hidden     Patient questions:  Do you have a fever, pain , or abdominal swelling? No. Pain Score  0 *  Have you tolerated food without any problems? Yes.    Have you been able to return to your normal activities? Yes.    Do you have any questions about your discharge instructions: Diet   No. Medications  No. Follow up visit  No.  Do you have questions or concerns about your Care? No.  Actions: * If pain score is 4 or above: No action needed, pain <4. 1. Have you developed a fever since your procedure? no  2.   Have you had an respiratory symptoms (SOB or cough) since your procedure? no  3.   Have you tested positive for COVID 19 since your procedure no  4.   Have you had any family members/close contacts diagnosed with the COVID 19 since your procedure?  no   If yes to any of these questions please route to Joylene John, RN and Alphonsa Gin, Therapist, sports.

## 2019-09-18 NOTE — Telephone Encounter (Signed)
-----   Message from Irene Shipper, MD sent at 09/18/2019  2:38 PM EDT ----- Regarding: RE: Lab work Please contact the patient and let her know that her blood work looks great.  Thanks ----- Message ----- From: Algernon Huxley, RN Sent: 09/18/2019   9:06 AM EDT To: Irene Shipper, MD Subject: RE: Lab work                                   Its already there in your inbox. Waiting on TSH ----- Message ----- From: Irene Shipper, MD Sent: 09/18/2019   8:41 AM EDT To: Algernon Huxley, RN Subject: RE: Lab work                                   Musician.  Please place in my inbox.  Thank you ----- Message ----- From: Algernon Huxley, RN Sent: 09/17/2019   3:07 PM EDT To: Irene Shipper, MD Subject: RE: Lab work                                   Spoke with lab and one of the machines that runs the TSH tests is down. They will not have part until probably tomorrow. Lab is printing off pts CBC and CMET and faxing the result for you to review. ----- Message ----- From: Irene Shipper, MD Sent: 09/17/2019   1:53 PM EDT To: Algernon Huxley, RN Subject: Lab work                                       Linda,I had wanted this patient to have lab work.  It is in the computer.  I cannot tell that it has been drawn.  Please make sure she gets her blood work.  ThanksDr. Mamie Nick

## 2019-09-24 DIAGNOSIS — Z20828 Contact with and (suspected) exposure to other viral communicable diseases: Secondary | ICD-10-CM | POA: Diagnosis not present

## 2019-10-13 ENCOUNTER — Other Ambulatory Visit: Payer: Self-pay | Admitting: Obstetrics and Gynecology

## 2019-10-15 ENCOUNTER — Encounter: Payer: Self-pay | Admitting: Internal Medicine

## 2019-10-22 ENCOUNTER — Ambulatory Visit: Payer: 59 | Admitting: Internal Medicine

## 2019-10-30 ENCOUNTER — Other Ambulatory Visit: Payer: Self-pay

## 2019-10-30 ENCOUNTER — Ambulatory Visit
Admission: RE | Admit: 2019-10-30 | Discharge: 2019-10-30 | Disposition: A | Discharge status: Home / Self Care | Payer: BC Managed Care – PPO | Source: Ambulatory Visit | Attending: Internal Medicine | Admitting: Internal Medicine

## 2019-10-30 DIAGNOSIS — Z1231 Encounter for screening mammogram for malignant neoplasm of breast: Secondary | ICD-10-CM | POA: Diagnosis not present

## 2019-11-03 DIAGNOSIS — L820 Inflamed seborrheic keratosis: Secondary | ICD-10-CM | POA: Diagnosis not present

## 2019-11-03 DIAGNOSIS — L821 Other seborrheic keratosis: Secondary | ICD-10-CM | POA: Diagnosis not present

## 2019-11-03 DIAGNOSIS — L570 Actinic keratosis: Secondary | ICD-10-CM | POA: Diagnosis not present

## 2019-11-03 DIAGNOSIS — Z85828 Personal history of other malignant neoplasm of skin: Secondary | ICD-10-CM | POA: Diagnosis not present

## 2019-11-03 DIAGNOSIS — D1801 Hemangioma of skin and subcutaneous tissue: Secondary | ICD-10-CM | POA: Diagnosis not present

## 2019-11-03 DIAGNOSIS — D2239 Melanocytic nevi of other parts of face: Secondary | ICD-10-CM | POA: Diagnosis not present

## 2019-11-21 DIAGNOSIS — U071 COVID-19: Secondary | ICD-10-CM

## 2019-11-21 HISTORY — DX: COVID-19: U07.1

## 2019-12-01 DIAGNOSIS — R7301 Impaired fasting glucose: Secondary | ICD-10-CM | POA: Diagnosis not present

## 2019-12-01 DIAGNOSIS — M859 Disorder of bone density and structure, unspecified: Secondary | ICD-10-CM | POA: Diagnosis not present

## 2019-12-01 DIAGNOSIS — E7849 Other hyperlipidemia: Secondary | ICD-10-CM | POA: Diagnosis not present

## 2019-12-04 ENCOUNTER — Ambulatory Visit: Payer: BC Managed Care – PPO | Attending: Internal Medicine

## 2019-12-04 DIAGNOSIS — Z23 Encounter for immunization: Secondary | ICD-10-CM

## 2019-12-04 NOTE — Progress Notes (Signed)
   Covid-19 Vaccination Clinic  Name:  Lisa Moody    MRN: [id number] DOB: 1946/08/03  12/04/2019  [name] was observed post Covid-19 immunization for 15 minutes without incidence. She was provided with Vaccine Information Sheet and instruction to access the V-Safe system.   [name] was instructed to call 911 with any severe reactions post vaccine: Marland Kitchen Difficulty breathing  . Swelling of your face and throat  . A fast heartbeat  . A bad rash all over your body  . Dizziness and weakness    Immunizations Administered    Name Date Dose VIS Date Route   Pfizer COVID-19 Vaccine 12/04/2019 10:01 AM 0.3 mL 10/31/2019 Intramuscular   Manufacturer: [employer], [employer]   Lot: [id number]   [place]: [id number]

## 2019-12-08 DIAGNOSIS — R0789 Other chest pain: Secondary | ICD-10-CM | POA: Diagnosis not present

## 2019-12-08 DIAGNOSIS — Z1331 Encounter for screening for depression: Secondary | ICD-10-CM | POA: Diagnosis not present

## 2019-12-08 DIAGNOSIS — M8580 Other specified disorders of bone density and structure, unspecified site: Secondary | ICD-10-CM | POA: Diagnosis not present

## 2019-12-08 DIAGNOSIS — E669 Obesity, unspecified: Secondary | ICD-10-CM | POA: Diagnosis not present

## 2019-12-08 DIAGNOSIS — M2011 Hallux valgus (acquired), right foot: Secondary | ICD-10-CM | POA: Diagnosis not present

## 2019-12-08 DIAGNOSIS — Z0000 Encounter for general adult medical examination without abnormal findings: Secondary | ICD-10-CM | POA: Diagnosis not present

## 2019-12-08 DIAGNOSIS — J45998 Other asthma: Secondary | ICD-10-CM | POA: Diagnosis not present

## 2019-12-09 ENCOUNTER — Other Ambulatory Visit: Payer: Self-pay | Admitting: Obstetrics and Gynecology

## 2019-12-09 DIAGNOSIS — Z01419 Encounter for gynecological examination (general) (routine) without abnormal findings: Secondary | ICD-10-CM

## 2019-12-09 DIAGNOSIS — M4726 Other spondylosis with radiculopathy, lumbar region: Secondary | ICD-10-CM | POA: Diagnosis not present

## 2019-12-09 DIAGNOSIS — M4310 Spondylolisthesis, site unspecified: Secondary | ICD-10-CM | POA: Diagnosis not present

## 2019-12-09 DIAGNOSIS — M5136 Other intervertebral disc degeneration, lumbar region: Secondary | ICD-10-CM | POA: Diagnosis not present

## 2019-12-09 DIAGNOSIS — M5416 Radiculopathy, lumbar region: Secondary | ICD-10-CM | POA: Diagnosis not present

## 2019-12-09 NOTE — Telephone Encounter (Signed)
Med refill request: Wellbutrin XL Last AEX: 09/07/2018 Next AEX: 12/17/2019 Last MMG (if hormonal med)  10/30/2019, BIRADS 1, neg Refill authorized: #90, 3RF, orders pended if approved.

## 2019-12-11 ENCOUNTER — Other Ambulatory Visit: Payer: Self-pay

## 2019-12-11 ENCOUNTER — Encounter: Payer: Self-pay | Admitting: Internal Medicine

## 2019-12-11 ENCOUNTER — Ambulatory Visit: Payer: BC Managed Care – PPO | Admitting: Internal Medicine

## 2019-12-11 VITALS — BP 128/84 | HR 96 | Temp 99.1°F | Ht 61.75 in | Wt 175.4 lb

## 2019-12-11 DIAGNOSIS — R110 Nausea: Secondary | ICD-10-CM

## 2019-12-11 DIAGNOSIS — Z86010 Personal history of colonic polyps: Secondary | ICD-10-CM

## 2019-12-11 DIAGNOSIS — Z01818 Encounter for other preprocedural examination: Secondary | ICD-10-CM | POA: Diagnosis not present

## 2019-12-11 DIAGNOSIS — K219 Gastro-esophageal reflux disease without esophagitis: Secondary | ICD-10-CM

## 2019-12-11 MED ORDER — NA SULFATE-K SULFATE-MG SULF 17.5-3.13-1.6 GM/177ML PO SOLN: 1.0000 | Freq: Once | ORAL | 0 refills | Status: AC

## 2019-12-11 NOTE — Patient Instructions (Addendum)
You have been scheduled for a colonoscopy. Please follow written instructions given to you at your visit today.  Please pick up your prep supplies at the pharmacy within the next 1-3 days. If you use inhalers (even only as needed), please bring them with you on the day of your procedure.   

## 2019-12-11 NOTE — Progress Notes (Signed)
HISTORY OF PRESENT ILLNESS:  Lisa Moody is a 74 y.o. female with past medical history as listed below who was evaluated September 10, 2019 regarding a 6-week history of nausea without vomiting and breakthrough reflux symptoms despite once daily PPI.  See that dictation for details.  Pantoprazole was increased to 40 mg twice daily.  Zofran prescribed for nausea as needed.  Blood work at that time including comprehensive metabolic panel, CBC with differential, and TSH were normal.  Upper endoscopy was then performed September 16, 2019.  She was found to have mild reflux esophagitis.  The examination was otherwise normal.  Twice daily PPI continued.  And abdominal ultrasound performed September 17, 2019.  This was unremarkable except for incidental small splenic cyst.  Patient presents today for follow-up as requested.  She tells me that her symptoms have completely resolved on twice daily PPI.  No breakthrough reflux.  No further nausea.  She is working on weight reduction.  GI review of systems is otherwise negative at this time.  She does wish to schedule her surveillance colonoscopy.  Her last colonoscopy was performed January 2015.  At that time she was found to have diminutive adenomas.  Prior colonoscopies in 2003 (-) and 2009 (small tubular adenomas) noted.  REVIEW OF SYSTEMS:  All non-GI ROS negative unless otherwise stated in the HPI except for back pain, headaches  Past Medical History:  Diagnosis Date  . Arthritis   . Benign neoplasm of colon   . Complication of anesthesia    anectine - caused her to have difficulty waking up and was on vent for few hours post op  . Diverticulitis   . Diverticulosis of colon (without mention of hemorrhage)   . Endometriosis    reason for hysterectomy.  ovaries remain.   . Esophageal reflux    few times per month not routine  . Esophagitis, unspecified   . Headache(784.0)   . Osteopenia   . Osteopenia   . Other and unspecified hyperlipidemia   . Other  and unspecified hyperlipidemia   . Other constipation   . Palpitations   . Personal history of other mental disorder   . PONV (postoperative nausea and vomiting)   . Pulmonary embolism ([place])    2006    Past Surgical History:  Procedure Laterality Date  . ABDOMINAL HYSTERECTOMY    . ABDOMINOPLASTY    . APPENDECTOMY    . CARDIAC CATHETERIZATION     09/06/10: normal coronaries and LV function (saw Dr. Rollene Fare in 2011)  . COLONOSCOPY    . JOINT REPLACEMENT     knee replacement  . LIPOSUCTION    . LUMBAR FUSION  March 2014  . LUMBAR LAMINECTOMY    . PARTIAL HYSTERECTOMY    . SHOULDER ARTHROSCOPY WITH ROTATOR CUFF REPAIR AND SUBACROMIAL DECOMPRESSION Right 02/26/2015   Procedure: RIGHT SHOULDER ARTHROSCOPY DEBRIDEMENT WITH SUBACROMIAL DECOMPRESSION, , ROTATOR CUFF REPAIR ;  Surgeon: Elsie Saas, MD;  Location: [facility];  Service: Orthopedics;  Laterality: Right;  . TOTAL KNEE ARTHROPLASTY  2010   left  . TUBAL LIGATION      Social History Lisa Moody  reports that she has never smoked. She has never used smokeless tobacco. She reports current alcohol use. She reports that she does not use drugs.  family history includes Breast cancer in her mother; Diabetes in her mother and paternal grandmother; Lung cancer in her father; Tics in her father.  Allergies  Allergen Reactions  . Anectine  [  Succinylcholine Chloride] Anaphylaxis  . Succinylcholine     Respiratory suppression   . Multi Vitamin-Fluoride [Multivitamins-Fluoride] Nausea Only       PHYSICAL EXAMINATION: Vital signs: BP 128/84 (BP Location: Left Arm, Patient Position: Sitting, Cuff Size: Normal)   Pulse 96   Temp 99.1 F (37.3 C)   Ht 5' 1.75" (1.568 m)   Wt 175 lb 6 oz (79.5 kg)   BMI 32.34 kg/m   Constitutional: generally well-appearing, no acute distress Psychiatric: alert and oriented x3, cooperative Eyes: anicteric Mouth: Mask Abdomen: Not reexamined Neuro: No obvious deficits.    ASSESSMENT:  1.  GERD.  Now adequately treated with twice daily PPI 2.  Nausea.  Resolved with adequate treatment of GERD 3.  History of multiple adenomatous colon polyps.  Due for surveillance   PLAN:  1.  Reflux precautions with attention to weight loss 2.  Continue pantoprazole 40 mg twice daily.  She may try to decrease her dosage to once daily.  If she remains asymptomatic, then continue with the lower dosage.  Otherwise, resume twice daily therapy and work towards significant weight loss 3.  Schedule colonoscopy.The nature of the procedure, as well as the risks, benefits, and alternatives were carefully and thoroughly reviewed with the patient. Ample time for discussion and questions allowed. The patient understood, was satisfied, and agreed to proceed. Total time of 30 minutes was spent preparing to see this patient, reviewing test results, obtaining history, performing medically appropriate (in this case limited) physical exam, counseling the patient regarding her conditions, ordering and arranging surveillance colonoscopy, and documenting clinical information in the health record

## 2019-12-16 ENCOUNTER — Other Ambulatory Visit: Payer: Self-pay

## 2019-12-16 NOTE — Progress Notes (Signed)
74 y.o. G3P3 Married Caucasian female here for annual exam.    Several family members affected affected by Covid.  She and her husband have received their first vaccine.   She wants to continue with Wellbutrin XL 150 mg.   Thinks her rectocele is stable.  Some urinary control issues.  She can have urgency to void if she waits too long to void.  She can also have slow voiding.  She is not interested in surgery.  Good reports from blood work with Dr. Joylene Draft.  She is schedule for BMD next year.   PCP: Crist Infante, MD    No LMP recorded. Patient has had a hysterectomy.           Sexually active: Yes.    The current method of family planning is status post hysterectomy.    Exercising: No.  patient having back issues at this time--getting epidural injections Smoker:  no  Health Maintenance: Pap: 2002 Neg History of abnormal Pap:  no MMG: 10-30-19 3D/Neg/density B/BiRads1 Colonoscopy: 12-04-13 polyps with Dr.Perry;next due 2020.  Scheduled for Dec 30, 2019.  BMD: 08-29-16  Result :Osteopenia of hip TDaP:: 11-10-13 Gardasil:   no HIV: no Hep C:no Screening Labs:  PCP.  Flu vaccine:  Completed.    reports that she has never smoked. She has never used smokeless tobacco. She reports current alcohol use. She reports that she does not use drugs.  Past Medical History:  Diagnosis Date  . Arthritis   . Benign neoplasm of colon   . Complication of anesthesia    anectine - caused her to have difficulty waking up and was on vent for few hours post op  . Diverticulitis   . Diverticulosis of colon (without mention of hemorrhage)   . Endometriosis    reason for hysterectomy.  ovaries remain.   . Esophageal reflux    few times per month not routine  . Esophagitis, unspecified   . Headache(784.0)   . Osteopenia   . Osteopenia   . Other and unspecified hyperlipidemia   . Other and unspecified hyperlipidemia   . Other constipation   . Palpitations   . Personal history of other  mental disorder   . PONV (postoperative nausea and vomiting)   . Pulmonary embolism ([place])    2006    Past Surgical History:  Procedure Laterality Date  . ABDOMINAL HYSTERECTOMY    . ABDOMINOPLASTY    . APPENDECTOMY    . CARDIAC CATHETERIZATION     09/06/10: normal coronaries and LV function (saw Dr. Rollene Fare in 2011)  . COLONOSCOPY    . JOINT REPLACEMENT     knee replacement  . LIPOSUCTION    . LUMBAR FUSION  March 2014  . LUMBAR LAMINECTOMY    . PARTIAL HYSTERECTOMY    . SHOULDER ARTHROSCOPY WITH ROTATOR CUFF REPAIR AND SUBACROMIAL DECOMPRESSION Right 02/26/2015   Procedure: RIGHT SHOULDER ARTHROSCOPY DEBRIDEMENT WITH SUBACROMIAL DECOMPRESSION, , ROTATOR CUFF REPAIR ;  Surgeon: Elsie Saas, MD;  Location: [facility];  Service: Orthopedics;  Laterality: Right;  . TOTAL KNEE ARTHROPLASTY  2010   left  . TUBAL LIGATION      Current Outpatient Medications  Medication Sig Dispense Refill  . albuterol (VENTOLIN HFA) 108 (90 Base) MCG/ACT inhaler INHALE 2 PUFFS EVERY 4 HOURS AS NEEDED FOR WHEEZING, COUGH, AND SHORTNESS OF BREATH    . atorvastatin (LIPITOR) 10 MG tablet Take 10 mg by mouth daily.    Marland Kitchen buPROPion (WELLBUTRIN XL) 150 MG 24  hr tablet TAKE 1 TABLET BY MOUTH EVERY DAY 30 tablet 0  . diclofenac (VOLTAREN) 75 MG EC tablet Take 75 mg by mouth 2 (two) times daily.    Marland Kitchen docusate sodium (COLACE) 100 MG capsule Take 100 mg by mouth as needed.     . ezetimibe (ZETIA) 10 MG tablet Take 10 mg by mouth daily.    . ondansetron (ZOFRAN) 4 MG tablet Take 1 tablet (4 mg total) by mouth every 4 (four) hours as needed for nausea or vomiting. 30 tablet 3  . pantoprazole (PROTONIX) 40 MG tablet Take 1 tablet (40 mg total) by mouth 2 (two) times daily. 60 tablet 6  . polyethylene glycol (MIRALAX / GLYCOLAX) packet Take 17 g by mouth daily as needed.      Current Facility-Administered Medications  Medication Dose Route Frequency Provider Last Rate Last Admin  . 0.9 %  sodium  chloride infusion  500 mL Intravenous Once Irene Shipper, MD        Family History  Problem Relation Age of Onset  . Tics Father   . Lung cancer Father   . Breast cancer Mother   . Diabetes Mother   . Diabetes Paternal Grandmother   . Colon cancer Neg Hx   . Esophageal cancer Neg Hx   . Rectal cancer Neg Hx   . Stomach cancer Neg Hx   . Colon polyps Neg Hx     Review of Systems  All other systems reviewed and are negative.   Exam:   There were no vitals taken for this visit.    General appearance: alert, cooperative and appears stated age Head: normocephalic, without obvious abnormality, atraumatic Neck: no adenopathy, supple, symmetrical, trachea midline and thyroid normal to inspection and palpation Lungs: clear to auscultation bilaterally Breasts: normal appearance, no masses or tenderness, No nipple retraction or dimpling, No nipple discharge or bleeding, No axillary adenopathy Heart: regular rate and rhythm Abdomen: soft, non-tender; no masses, no organomegaly Extremities: extremities normal, atraumatic, no cyanosis or edema Skin: skin color, texture, turgor normal. No rashes or lesions Lymph nodes: cervical, supraclavicular, and axillary nodes normal. Neurologic: grossly normal  Pelvic: External genitalia:  no lesions              No abnormal inguinal nodes palpated.              Urethra:  normal appearing urethra with no masses, tenderness or lesions              Bartholins and Skenes: normal                 Vagina: normal appearing vagina with normal color and discharge, no lesions.  Second degree cystocele and first degree rectocele.               Cervix: absent              Pap taken: No. Bimanual Exam:  Uterus:  absent              Adnexa: no mass, fullness, tenderness              Rectal exam: Yes.  .  Confirms.              Anus:  normal sphincter tone, no lesions  Chaperone was present for exam.  Assessment:   Well woman visit with normal exam. Status  post hysterectomy for endometriosis. Ovaries remain.  Hx diverticulitis.  Hx PE post op in past.  Cystocele and rectocele.  Mood disorder. Depression symptomsimproved on Wellbutrin XL 150 mg.  Osteopenia. FH breast cancer.    Plan: Mammogram screening discussed. Self breast awareness reviewed. Pap and HR HPV as above. Guidelines for Calcium, Vitamin D, regular exercise program including cardiovascular and weight bearing exercise. Refill of Wellbutrin Xl 150 mg for one year.  Avoid straining and heavy lifting if possible. Follow up annually and prn.   After visit summary provided.

## 2019-12-17 ENCOUNTER — Encounter: Payer: Self-pay | Admitting: Obstetrics and Gynecology

## 2019-12-17 ENCOUNTER — Ambulatory Visit: Payer: BC Managed Care – PPO | Admitting: Obstetrics and Gynecology

## 2019-12-17 DIAGNOSIS — Z01419 Encounter for gynecological examination (general) (routine) without abnormal findings: Secondary | ICD-10-CM

## 2019-12-17 MED ORDER — BUPROPION HCL ER (XL) 150 MG PO TB24: 150.0000 mg | ORAL_TABLET | Freq: Every day | ORAL | 3 refills | Status: DC

## 2019-12-17 NOTE — Patient Instructions (Signed)

## 2019-12-22 ENCOUNTER — Ambulatory Visit: Payer: BC Managed Care – PPO | Attending: Internal Medicine

## 2019-12-22 DIAGNOSIS — Z23 Encounter for immunization: Secondary | ICD-10-CM | POA: Insufficient documentation

## 2019-12-22 NOTE — Progress Notes (Signed)
   Covid-19 Vaccination Clinic  Name:  JAMEICA MIYASATO    MRN: [id number] DOB: May 18, 1946  12/22/2019  Ms. Dorce was observed post Covid-19 immunization for 15 minutes without incidence. She was provided with Vaccine Information Sheet and instruction to access the V-Safe system.   Ms. Gideon was instructed to call 911 with any severe reactions post vaccine: Marland Kitchen Difficulty breathing  . Swelling of your face and throat  . A fast heartbeat  . A bad rash all over your body  . Dizziness and weakness    Immunizations Administered    Name Date Dose VIS Date Route   Pfizer COVID-19 Vaccine 12/22/2019  9:00 AM 0.3 mL 10/31/2019 Intramuscular   Manufacturer: [employer]   Lot: [id number]   [place]: [id number]

## 2019-12-26 ENCOUNTER — Other Ambulatory Visit: Payer: Self-pay | Admitting: Internal Medicine

## 2019-12-26 ENCOUNTER — Ambulatory Visit (INDEPENDENT_AMBULATORY_CARE_PROVIDER_SITE_OTHER): Payer: BC Managed Care – PPO

## 2019-12-26 DIAGNOSIS — Z1159 Encounter for screening for other viral diseases: Secondary | ICD-10-CM

## 2019-12-27 LAB — SARS CORONAVIRUS 2 (TAT 6-24 HRS): SARS Coronavirus 2: NEGATIVE

## 2019-12-30 ENCOUNTER — Ambulatory Visit (AMBULATORY_SURGERY_CENTER): Payer: BC Managed Care – PPO | Admitting: Internal Medicine

## 2019-12-30 ENCOUNTER — Other Ambulatory Visit: Payer: Self-pay

## 2019-12-30 ENCOUNTER — Encounter: Payer: Self-pay | Admitting: Internal Medicine

## 2019-12-30 VITALS — BP 145/78 | HR 72 | Temp 96.9°F | Resp 18 | Ht 61.0 in | Wt 175.0 lb

## 2019-12-30 DIAGNOSIS — Z86010 Personal history of colonic polyps: Secondary | ICD-10-CM | POA: Diagnosis not present

## 2019-12-30 DIAGNOSIS — Z1211 Encounter for screening for malignant neoplasm of colon: Secondary | ICD-10-CM | POA: Diagnosis not present

## 2019-12-30 MED ORDER — SODIUM CHLORIDE 0.9 % IV SOLN: 500.0000 mL | Freq: Once | INTRAVENOUS | Status: DC

## 2019-12-30 NOTE — Patient Instructions (Signed)
YOU HAD AN ENDOSCOPIC PROCEDURE TODAY AT [facility]:   Refer to the procedure report that was given to you for any specific questions about what was found during the examination.  If the procedure report does not answer your questions, please call your gastroenterologist to clarify.  If you requested that your care partner not be given the details of your procedure findings, then the procedure report has been included in a sealed envelope for you to review at your convenience later.  YOU SHOULD EXPECT: Some feelings of bloating in the abdomen. Passage of more gas than usual.  Walking can help get rid of the air that was put into your GI tract during the procedure and reduce the bloating. If you had a lower endoscopy (such as a colonoscopy or flexible sigmoidoscopy) you may notice spotting of blood in your stool or on the toilet paper. If you underwent a bowel prep for your procedure, you may not have a normal bowel movement for a few days.  Please Note:  You might notice some irritation and congestion in your nose or some drainage.  This is from the oxygen used during your procedure.  There is no need for concern and it should clear up in a day or so.  SYMPTOMS TO REPORT IMMEDIATELY:   Following lower endoscopy (colonoscopy or flexible sigmoidoscopy):  Excessive amounts of blood in the stool  Significant tenderness or worsening of abdominal pains  Swelling of the abdomen that is new, acute  Fever of 100F or higher    For urgent or emergent issues, a gastroenterologist can be reached at any hour by calling [phone].   DIET:  We do recommend a small meal at first, but then you may proceed to your regular diet.  Drink plenty of fluids but you should avoid alcoholic beverages for 24 hours.  ACTIVITY:  You should plan to take it easy for the rest of today and you should NOT DRIVE or use heavy machinery until tomorrow (because of the sedation medicines used during the test).     FOLLOW UP: Our staff will call the number listed on your records 48-72 hours following your procedure to check on you and address any questions or concerns that you may have regarding the information given to you following your procedure. If we do not reach you, we will leave a message.  We will attempt to reach you two times.  During this call, we will ask if you have developed any symptoms of COVID 19. If you develop any symptoms (ie: fever, flu-like symptoms, shortness of breath, cough etc.) before then, please call [phone].  If you test positive for Covid 19 in the 2 weeks post procedure, please call and report this information to [place].    If any biopsies were taken you will be contacted by phone or by letter within the next 1-3 weeks.  Please call us at [phone] if you have not heard about the biopsies in 3 weeks.    SIGNATURES/CONFIDENTIALITY: You and/or your care partner have signed paperwork which will be entered into your electronic medical record.  These signatures attest to the fact that that the information above on your After Visit Summary has been reviewed and is understood.  Full responsibility of the confidentiality of this discharge information lies with you and/or your care-partner.   Resume medications. Information given on diverticulosis.

## 2019-12-30 NOTE — Op Note (Signed)
[facility] Patient Name: Lisa Moody Procedure Date: 12/30/2019 11:12 AM MRN: [id number] Endoscopist: Docia Chuck. Henrene Pastor , MD Age: 74 Referring MD:  Date of Birth: [birth year] Gender: Female Account #: [id number] Procedure:                Colonoscopy Indications:              High risk colon cancer surveillance: Personal                            history of non-advanced adenomas. Previous                            examinations 2003, 2009, 2015 Medicines:                Monitored Anesthesia Care Procedure:                Pre-Anesthesia Assessment:                           - Prior to the procedure, a History and Physical                            was performed, and patient medications and                            allergies were reviewed. The patient's tolerance of                            previous anesthesia was also reviewed. The risks                            and benefits of the procedure and the sedation                            options and risks were discussed with the patient.                            All questions were answered, and informed consent                            was obtained. Prior Anticoagulants: The patient has                            taken no previous anticoagulant or antiplatelet                            agents. ASA Grade Assessment: II - A patient with                            mild systemic disease. After reviewing the risks                            and benefits, the patient was deemed in  satisfactory condition to undergo the procedure.                           After obtaining informed consent, the colonoscope                            was passed under direct vision. Throughout the                            procedure, the patient's blood pressure, pulse, and                            oxygen saturations were monitored continuously. The                            Colonoscope was introduced through the  anus and                            advanced to the the cecum, identified by                            appendiceal orifice and ileocecal valve. The                            ileocecal valve, appendiceal orifice, and rectum                            were photographed. The quality of the bowel                            preparation was excellent. The colonoscopy was                            performed without difficulty. The patient tolerated                            the procedure well. The bowel preparation used was                            SUPREP via split dose instruction. Scope In: 11:28:55 AM Scope Out: 11:41:26 AM Scope Withdrawal Time: 0 hours 8 minutes 47 seconds  Total Procedure Duration: 0 hours 12 minutes 31 seconds  Findings:                 Many small and large-mouthed diverticula were found                            in the entire colon.                           The entire examined colon appeared normal on direct                            and retroflexion views. Complications:  No immediate complications. Estimated blood loss:                            None. Estimated Blood Loss:     Estimated blood loss: none. Impression:               - Diverticulosis in the entire examined colon.                           - The entire examined colon is normal on direct and                            retroflexion views.                           - No specimens collected. Recommendation:           - Repeat colonoscopy is not recommended for                            surveillance.                           - Patient has a contact number available for                            emergencies. The signs and symptoms of potential                            delayed complications were discussed with the                            patient. Return to normal activities tomorrow.                            Written discharge instructions were provided to the                             patient.                           - Resume previous diet.                           - Continue present medications. Docia Chuck. Henrene Pastor, MD 12/30/2019 11:46:55 AM This report has been signed electronically.

## 2019-12-30 NOTE — Progress Notes (Signed)
Temp by LC. VS by DT.

## 2019-12-30 NOTE — Progress Notes (Signed)
PT taken to PACU. Monitors in place. VSS. Report given to RN. 

## 2020-01-01 ENCOUNTER — Telehealth: Payer: Self-pay

## 2020-01-01 NOTE — Telephone Encounter (Signed)
  Follow up Call-  Call back number 12/30/2019 09/16/2019  Post procedure Call Back phone  # [phone]  Permission to leave phone message Yes Yes  Some recent data might be hidden     Patient questions:  Do you have a fever, pain , or abdominal swelling? No. Pain Score  0 *  Have you tolerated food without any problems? Yes.    Have you been able to return to your normal activities? Yes.    Do you have any questions about your discharge instructions: Diet   No. Medications  No. Follow up visit  No.  Do you have questions or concerns about your Care? No.  Actions: * If pain score is 4 or above: No action needed, pain <4. 1. Have you developed a fever since your procedure? no  2.   Have you had an respiratory symptoms (SOB or cough) since your procedure? no  3.   Have you tested positive for COVID 19 since your procedure no  4.   Have you had any family members/close contacts diagnosed with the COVID 19 since your procedure?  no   If yes to any of these questions please route to Joylene John, RN and Alphonsa Gin, Therapist, sports.

## 2020-01-15 DIAGNOSIS — M4726 Other spondylosis with radiculopathy, lumbar region: Secondary | ICD-10-CM | POA: Diagnosis not present

## 2020-01-15 DIAGNOSIS — M5136 Other intervertebral disc degeneration, lumbar region: Secondary | ICD-10-CM | POA: Diagnosis not present

## 2020-01-15 DIAGNOSIS — M48061 Spinal stenosis, lumbar region without neurogenic claudication: Secondary | ICD-10-CM | POA: Diagnosis not present

## 2020-02-03 ENCOUNTER — Other Ambulatory Visit: Payer: Self-pay | Admitting: Orthopaedic Surgery

## 2020-02-05 DIAGNOSIS — H4322 Crystalline deposits in vitreous body, left eye: Secondary | ICD-10-CM | POA: Diagnosis not present

## 2020-02-05 DIAGNOSIS — H2513 Age-related nuclear cataract, bilateral: Secondary | ICD-10-CM | POA: Diagnosis not present

## 2020-02-05 DIAGNOSIS — H04123 Dry eye syndrome of bilateral lacrimal glands: Secondary | ICD-10-CM | POA: Diagnosis not present

## 2020-02-11 ENCOUNTER — Encounter: Payer: Self-pay | Admitting: Certified Nurse Midwife

## 2020-03-01 ENCOUNTER — Encounter (HOSPITAL_BASED_OUTPATIENT_CLINIC_OR_DEPARTMENT_OTHER): Payer: Self-pay | Admitting: Orthopaedic Surgery

## 2020-03-01 ENCOUNTER — Other Ambulatory Visit: Payer: Self-pay | Admitting: Internal Medicine

## 2020-03-01 ENCOUNTER — Other Ambulatory Visit: Payer: Self-pay

## 2020-03-02 NOTE — Progress Notes (Signed)
chart reviewed by Dr. Daiva Huge and will proceed with surgery as scheduled.

## 2020-03-05 ENCOUNTER — Other Ambulatory Visit (HOSPITAL_COMMUNITY)
Admission: RE | Admit: 2020-03-05 | Discharge: 2020-03-05 | Disposition: A | Discharge status: Home / Self Care | Payer: BC Managed Care – PPO | Source: Ambulatory Visit | Attending: Orthopaedic Surgery | Admitting: Orthopaedic Surgery

## 2020-03-05 DIAGNOSIS — Z20822 Contact with and (suspected) exposure to covid-19: Secondary | ICD-10-CM | POA: Diagnosis not present

## 2020-03-05 DIAGNOSIS — Z01812 Encounter for preprocedural laboratory examination: Secondary | ICD-10-CM | POA: Insufficient documentation

## 2020-03-05 LAB — SARS CORONAVIRUS 2 (TAT 6-24 HRS): SARS Coronavirus 2: NEGATIVE

## 2020-03-05 NOTE — Progress Notes (Signed)

## 2020-03-09 ENCOUNTER — Other Ambulatory Visit: Payer: Self-pay

## 2020-03-09 ENCOUNTER — Encounter (HOSPITAL_BASED_OUTPATIENT_CLINIC_OR_DEPARTMENT_OTHER)
Admission: RE | Disposition: A | Discharge status: Home / Self Care | Payer: Self-pay | Source: Home / Self Care | Attending: Orthopaedic Surgery

## 2020-03-09 ENCOUNTER — Ambulatory Visit (HOSPITAL_BASED_OUTPATIENT_CLINIC_OR_DEPARTMENT_OTHER): Payer: BC Managed Care – PPO | Admitting: Certified Registered"

## 2020-03-09 ENCOUNTER — Encounter (HOSPITAL_BASED_OUTPATIENT_CLINIC_OR_DEPARTMENT_OTHER): Payer: Self-pay | Admitting: Orthopaedic Surgery

## 2020-03-09 ENCOUNTER — Ambulatory Visit (HOSPITAL_BASED_OUTPATIENT_CLINIC_OR_DEPARTMENT_OTHER)
Admission: RE | Admit: 2020-03-09 | Discharge: 2020-03-09 | Disposition: A | Discharge status: Home / Self Care | Payer: BC Managed Care – PPO | Attending: Orthopaedic Surgery | Admitting: Orthopaedic Surgery

## 2020-03-09 DIAGNOSIS — M19071 Primary osteoarthritis, right ankle and foot: Secondary | ICD-10-CM | POA: Insufficient documentation

## 2020-03-09 DIAGNOSIS — K219 Gastro-esophageal reflux disease without esophagitis: Secondary | ICD-10-CM | POA: Diagnosis not present

## 2020-03-09 DIAGNOSIS — Z981 Arthrodesis status: Secondary | ICD-10-CM | POA: Insufficient documentation

## 2020-03-09 DIAGNOSIS — Z791 Long term (current) use of non-steroidal anti-inflammatories (NSAID): Secondary | ICD-10-CM | POA: Diagnosis not present

## 2020-03-09 DIAGNOSIS — J45909 Unspecified asthma, uncomplicated: Secondary | ICD-10-CM | POA: Insufficient documentation

## 2020-03-09 DIAGNOSIS — Z86711 Personal history of pulmonary embolism: Secondary | ICD-10-CM | POA: Insufficient documentation

## 2020-03-09 DIAGNOSIS — E785 Hyperlipidemia, unspecified: Secondary | ICD-10-CM | POA: Diagnosis not present

## 2020-03-09 DIAGNOSIS — M8580 Other specified disorders of bone density and structure, unspecified site: Secondary | ICD-10-CM | POA: Diagnosis not present

## 2020-03-09 DIAGNOSIS — Z96652 Presence of left artificial knee joint: Secondary | ICD-10-CM | POA: Diagnosis not present

## 2020-03-09 DIAGNOSIS — Z79899 Other long term (current) drug therapy: Secondary | ICD-10-CM | POA: Diagnosis not present

## 2020-03-09 DIAGNOSIS — M21611 Bunion of right foot: Secondary | ICD-10-CM | POA: Insufficient documentation

## 2020-03-09 DIAGNOSIS — M2012 Hallux valgus (acquired), left foot: Secondary | ICD-10-CM | POA: Diagnosis not present

## 2020-03-09 DIAGNOSIS — M2011 Hallux valgus (acquired), right foot: Secondary | ICD-10-CM | POA: Insufficient documentation

## 2020-03-09 DIAGNOSIS — E7849 Other hyperlipidemia: Secondary | ICD-10-CM | POA: Diagnosis not present

## 2020-03-09 HISTORY — DX: Dorsalgia, unspecified: M54.9

## 2020-03-09 HISTORY — PX: BUNIONECTOMY WITH WEIL OSTEOTOMY: SHX5604

## 2020-03-09 SURGERY — BUNIONECTOMY WITH WEIL OSTEOTOMY
Anesthesia: Regional | Site: Foot | Laterality: Right

## 2020-03-09 MED ORDER — POVIDONE-IODINE 10 % EX SWAB: 2.0000 "application " | Freq: Once | CUTANEOUS | Status: DC

## 2020-03-09 MED ORDER — MIDAZOLAM HCL 2 MG/2ML IJ SOLN
1.0000 mg | INTRAMUSCULAR | Status: DC | PRN
  Administered 2020-03-09: 10:00:00 1 mg via INTRAVENOUS

## 2020-03-09 MED ORDER — MIDAZOLAM HCL 2 MG/2ML IJ SOLN
INTRAMUSCULAR | Status: AC
  Filled 2020-03-09: qty 2

## 2020-03-09 MED ORDER — ROPIVACAINE HCL 5 MG/ML IJ SOLN
INTRAMUSCULAR | Status: DC | PRN
  Administered 2020-03-09: 30 mL via PERINEURAL

## 2020-03-09 MED ORDER — HYDROMORPHONE HCL 1 MG/ML IJ SOLN: 0.2500 mg | INTRAMUSCULAR | Status: DC | PRN

## 2020-03-09 MED ORDER — LACTATED RINGERS IV SOLN: INTRAVENOUS | Status: DC

## 2020-03-09 MED ORDER — CEFAZOLIN SODIUM-DEXTROSE 2-4 GM/100ML-% IV SOLN
2.0000 g | INTRAVENOUS | Status: AC
  Administered 2020-03-09: 2 g via INTRAVENOUS

## 2020-03-09 MED ORDER — OXYCODONE HCL 5 MG PO TABS: 5.0000 mg | ORAL_TABLET | Freq: Once | ORAL | Status: DC | PRN

## 2020-03-09 MED ORDER — PROPOFOL 500 MG/50ML IV EMUL
INTRAVENOUS | Status: DC | PRN
  Administered 2020-03-09: 100 ug/kg/min via INTRAVENOUS

## 2020-03-09 MED ORDER — FENTANYL CITRATE (PF) 100 MCG/2ML IJ SOLN
INTRAMUSCULAR | Status: AC
  Filled 2020-03-09: qty 2

## 2020-03-09 MED ORDER — FENTANYL CITRATE (PF) 100 MCG/2ML IJ SOLN
50.0000 ug | INTRAMUSCULAR | Status: DC | PRN
  Administered 2020-03-09: 10:00:00 50 ug via INTRAVENOUS

## 2020-03-09 MED ORDER — PROMETHAZINE HCL 25 MG/ML IJ SOLN: 6.2500 mg | INTRAMUSCULAR | Status: DC | PRN

## 2020-03-09 MED ORDER — LIDOCAINE HCL (CARDIAC) PF 100 MG/5ML IV SOSY
PREFILLED_SYRINGE | INTRAVENOUS | Status: DC | PRN
  Administered 2020-03-09: 30 mg via INTRAVENOUS

## 2020-03-09 MED ORDER — OXYCODONE HCL 5 MG/5ML PO SOLN: 5.0000 mg | Freq: Once | ORAL | Status: DC | PRN

## 2020-03-09 MED ORDER — ONDANSETRON HCL 4 MG/2ML IJ SOLN
INTRAMUSCULAR | Status: DC | PRN
  Administered 2020-03-09: 4 mg via INTRAVENOUS

## 2020-03-09 MED ORDER — CEFAZOLIN SODIUM-DEXTROSE 2-4 GM/100ML-% IV SOLN
INTRAVENOUS | Status: AC
  Filled 2020-03-09: qty 100

## 2020-03-09 MED ORDER — OXYCODONE HCL 5 MG PO TABS: 5.0000 mg | ORAL_TABLET | ORAL | 0 refills | Status: AC | PRN

## 2020-03-09 SURGICAL SUPPLY — 64 items
APL PRP STRL LF DISP 70% ISPRP (MISCELLANEOUS) ×1
BIT DRILL PROS QC 1.5 (BIT) ×1 IMPLANT
BIT DRILL PROS QC 1.5MM (BIT) ×1
BLADE AVERAGE 25MMX9MM (BLADE) ×1
BLADE AVERAGE 25X9 (BLADE) ×2 IMPLANT
BLADE OSC/SAG .038X5.5 CUT EDG (BLADE) IMPLANT
BLADE SURG 15 STRL LF DISP TIS (BLADE) ×2 IMPLANT
BLADE SURG 15 STRL SS (BLADE) ×12
BNDG CMPR 9X4 STRL LF SNTH (GAUZE/BANDAGES/DRESSINGS) ×1
BNDG COHESIVE 4X5 TAN STRL (GAUZE/BANDAGES/DRESSINGS) ×2 IMPLANT
BNDG CONFORM 2 STRL LF (GAUZE/BANDAGES/DRESSINGS) ×3 IMPLANT
BNDG ELASTIC 4X5.8 VLCR STR LF (GAUZE/BANDAGES/DRESSINGS) ×3 IMPLANT
BNDG ESMARK 4X9 LF (GAUZE/BANDAGES/DRESSINGS) ×3 IMPLANT
CHLORAPREP W/TINT 26 (MISCELLANEOUS) ×3 IMPLANT
COVER BACK TABLE 60X90IN (DRAPES) ×3 IMPLANT
COVER WAND RF STERILE (DRAPES) IMPLANT
DECANTER SPIKE VIAL GLASS SM (MISCELLANEOUS) IMPLANT
DRAPE EXTREMITY T 121X128X90 (DISPOSABLE) ×3 IMPLANT
DRAPE IMP U-DRAPE 54X76 (DRAPES) ×3 IMPLANT
DRAPE OEC MINIVIEW 54X84 (DRAPES) ×3 IMPLANT
DRAPE U-SHAPE 47X51 STRL (DRAPES) ×3 IMPLANT
ELECT REM PT RETURN 9FT ADLT (ELECTROSURGICAL) ×3
ELECTRODE REM PT RTRN 9FT ADLT (ELECTROSURGICAL) ×1 IMPLANT
GAUZE SPONGE 4X4 12PLY STRL (GAUZE/BANDAGES/DRESSINGS) ×3 IMPLANT
GAUZE XEROFORM 1X8 LF (GAUZE/BANDAGES/DRESSINGS) ×3 IMPLANT
GLOVE BIOGEL M STRL SZ7.5 (GLOVE) ×3 IMPLANT
GLOVE BIOGEL PI IND STRL 7.0 (GLOVE) IMPLANT
GLOVE BIOGEL PI IND STRL 8 (GLOVE) ×1 IMPLANT
GLOVE BIOGEL PI INDICATOR 7.0 (GLOVE) ×4
GLOVE BIOGEL PI INDICATOR 8 (GLOVE) ×2
GLOVE ECLIPSE 6.5 STRL STRAW (GLOVE) ×2 IMPLANT
GOWN STRL REUS W/ TWL LRG LVL3 (GOWN DISPOSABLE) ×1 IMPLANT
GOWN STRL REUS W/ TWL XL LVL3 (GOWN DISPOSABLE) ×1 IMPLANT
GOWN STRL REUS W/TWL LRG LVL3 (GOWN DISPOSABLE) ×3
GOWN STRL REUS W/TWL XL LVL3 (GOWN DISPOSABLE) ×3
NEEDLE HYPO 22GX1.5 SAFETY (NEEDLE) IMPLANT
NS IRRIG 1000ML POUR BTL (IV SOLUTION) ×3 IMPLANT
PACK BASIN DAY SURGERY FS (CUSTOM PROCEDURE TRAY) ×3 IMPLANT
PAD CAST 4YDX4 CTTN HI CHSV (CAST SUPPLIES) ×1 IMPLANT
PADDING CAST COTTON 4X4 STRL (CAST SUPPLIES) ×3
PENCIL SMOKE EVACUATOR (MISCELLANEOUS) ×3 IMPLANT
SCREW CORTEX ST 2.0X18 (Screw) ×2 IMPLANT
SHEET MEDIUM DRAPE 40X70 STRL (DRAPES) ×3 IMPLANT
SLEEVE SCD COMPRESS KNEE MED (MISCELLANEOUS) ×3 IMPLANT
SPONGE LAP 18X18 RF (DISPOSABLE) ×3 IMPLANT
STOCKINETTE 6  STRL (DRAPES) ×3
STOCKINETTE 6 STRL (DRAPES) ×1 IMPLANT
SUCTION FRAZIER HANDLE 10FR (MISCELLANEOUS) ×3
SUCTION TUBE FRAZIER 10FR DISP (MISCELLANEOUS) ×1 IMPLANT
SUT BONE WAX W31G (SUTURE) ×2 IMPLANT
SUT ETHILON 3 0 PS 1 (SUTURE) ×3 IMPLANT
SUT FIBERWIRE 2-0 18 17.9 3/8 (SUTURE) ×3
SUT MNCRL AB 3-0 PS2 18 (SUTURE) ×3 IMPLANT
SUT PDS AB 2-0 CT2 27 (SUTURE) ×3 IMPLANT
SUT VIC AB 2-0 SH 27 (SUTURE)
SUT VIC AB 2-0 SH 27XBRD (SUTURE) IMPLANT
SUT VIC AB 3-0 FS2 27 (SUTURE) IMPLANT
SUTURE FIBERWR 2-0 18 17.9 3/8 (SUTURE) IMPLANT
SYR BULB EAR ULCER 3OZ GRN STR (SYRINGE) ×3 IMPLANT
SYR CONTROL 10ML LL (SYRINGE) IMPLANT
TOWEL GREEN STERILE FF (TOWEL DISPOSABLE) ×6 IMPLANT
TUBE CONNECTING 20'X1/4 (TUBING) ×1
TUBE CONNECTING 20X1/4 (TUBING) ×2 IMPLANT
UNDERPAD 30X36 HEAVY ABSORB (UNDERPADS AND DIAPERS) ×3 IMPLANT

## 2020-03-09 NOTE — Anesthesia Postprocedure Evaluation (Signed)
Anesthesia Post Note  Patient: KARIA EHRESMAN  Procedure(s) Performed: RIGHT DISTAL CHEVRON OSTEOTOMY BUNION CORRECTION (Right Foot)     Patient location during evaluation: PACU Anesthesia Type: Regional and MAC Level of consciousness: awake and alert Pain management: pain level controlled Vital Signs Assessment: post-procedure vital signs reviewed and stable Respiratory status: spontaneous breathing, nonlabored ventilation and respiratory function stable Cardiovascular status: blood pressure returned to baseline and stable Postop Assessment: no apparent nausea or vomiting Anesthetic complications: no    Last Vitals:  Vitals:   03/09/20 1145 03/09/20 1202  BP: (!) 148/90 (!) 159/86  Pulse: 69 73  Resp: 15 19  Temp:    SpO2: 97% 98%    Last Pain:  Vitals:   03/09/20 1202  TempSrc:   PainSc: 0-No pain                 Lynda Rainwater

## 2020-03-09 NOTE — Op Note (Addendum)
Lisa Moody female 74 y.o. 03/09/2020  PreOperative Diagnosis: Symptomatic right hallux valgus  PostOperative Diagnosis: Same  PROCEDURE: Hallux valgus, bunion correction with distal chevron osteotomy  SURGEON: Melony Overly, MD  ASSISTANT: None  ANESTHESIA: MAC with peripheral nerve block  FINDINGS: Hallux valgus with some degenerative changes seen within the first MTP joint  IMPLANTS: 2.0 millimeter screw  INDICATIONS:73 y.o. female had painful hallux valgus.  She had failed conservative treatment and was indicated for surgery.  She also had some deviation of the second toe but no pain associated with this.  On x-rays revealed that there was deviation through the middle phalanx mostly at the PIP and DIP joints.  MTP joint is reduced well.  Given this she is indicated for distal chevron osteotomy.  She understood the risk, benefits alternatives of surgery which include but not limited to wound healing complications, infection, nonunion, malunion, need for further surgery as well as damage to surrounding structures.  She would like to proceed.  PROCEDURE: Patient was identified in the preoperative holding area.  The right foot was marked by myself.  Consent was signed myself and the patient.  Nerve block was performed by anesthesia.  She is taken the operative suite placed supine the operative table.  MAC anesthesia was induced without difficulty.  Preoperative antibiotics were given.  Right lower extremity was prepped and draped in the usual sterile fashion.  Bone foam was used.  All bony prominences were well-padded.  We began by making a longitudinal incision overlying the medial eminence of the first MTP joint.  This taken sharply down through skin and subcutaneous tissue.  Skin flaps were created dorsally and plantarly.  Care was taken to identify the dorsal cutaneous branch which was identified protected through the entirety of the case.  Then once the capsule was  identified longitudinal incision in line with the incision through the capsule down to bone was created.  Capsular flaps were created dorsally and plantarly.  The first MTP joint was inspected and found to have some arthritis within this joint.  There is small dorsal osteophyte that was removed with a rondure.  Then the medial eminence was removed with a sagittal saw.  The interval between the sesamoids and the first metatarsal head was identified and using a Freer the lateral capsule was identified and the 15 blade was used to perform a lateral release.  Then once appropriate mobility of the joint was obtained the sagittal saw was used to create a distal chevron osteotomy.  Using a towel clip the osteotomy site was translated and reduced.  It was held provisionally with pointed reduction forcep.  Then fluoroscopy confirmed appropriate position of the sesamoids and of the hallux MTP joint.  The osteotomy site was fixed with a 2.0 millimeter screw.  Then fluoroscopy confirmed appropriate screw length and position.  Bone wax was placed on the exposed cancellous surface of the osteotomy site.  The Esmarch tourniquet was released and then the capsular tissue was closed in an interrupted pants over vest fashion using 2-0 FiberWire stitch.  After sutures were placed fluoroscopy again confirmed appropriate position of the toe.  There was slight deviation of the second toe in a valgus position with a well reduced MTP joint.  The position was coming mostly through the phalanx bones and joints.  It was decided to not address these surgically as she was asymptomatic with regard to this previously.  Then the remaining capsular tissue was closed with a 2-0 PDS suture.  The wound was irrigated with sterile saline.  The skin was closed in a layered fashion using 3-0 Monocryl and 3-0 nylon suture.  Xeroform, 4 x 4's and a bunion dressing was placed.  She was awakened from anesthesia and taken recovery in stable condition.  There  were no complications.  All counts were correct at the end of the case.  POST OPERATIVE INSTRUCTIONS: Keep dressing in place Heel weightbearing to right lower extremity in a postoperative shoe. Pain medication sent in Call the office with concerns She will follow-up in 2 weeks for nonweightbearing x-rays of the left foot and suture removal if appropriate and placement of a bunion dressing.   BLOOD LOSS:  Minimal         DRAINS: none         SPECIMEN: none       COMPLICATIONS:  * No complications entered in OR log *         Disposition: PACU - hemodynamically stable.         Condition: stable

## 2020-03-09 NOTE — Anesthesia Preprocedure Evaluation (Addendum)
Anesthesia Evaluation  Patient identified by MRN, date of birth, ID band Patient awake    Reviewed: Allergy & Precautions, NPO status , Patient's Chart, lab work & pertinent test results  History of Anesthesia Complications (+) PONV and PSEUDOCHOLINESTERASE DEFICIENCY  Airway Mallampati: II  TM Distance: >3 FB Neck ROM: full    Dental no notable dental hx.    Pulmonary asthma ,    Pulmonary exam normal breath sounds clear to auscultation       Cardiovascular Normal cardiovascular exam Rhythm:regular Rate:Normal     Neuro/Psych  Headaches,    GI/Hepatic GERD  ,  Endo/Other    Renal/GU      Musculoskeletal  (+) Arthritis , Osteoarthritis,    Abdominal (+) + obese,   Peds  Hematology   Anesthesia Other Findings   Reproductive/Obstetrics                             Anesthesia Physical  Anesthesia Plan  ASA: II  Anesthesia Plan: Regional and MAC   Post-op Pain Management:  Regional for Post-op pain   Induction: Intravenous  PONV Risk Score and Plan: 3 and Ondansetron, Dexamethasone and Midazolam  Airway Management Planned: Simple Face Mask  Additional Equipment:   Intra-op Plan:   Post-operative Plan:   Informed Consent: I have reviewed the patients History and Physical, chart, labs and discussed the procedure including the risks, benefits and alternatives for the proposed anesthesia with the patient or authorized representative who has indicated his/her understanding and acceptance.     Dental advisory given  Plan Discussed with: CRNA, Anesthesiologist and Surgeon  Anesthesia Plan Comments:        Anesthesia Quick Evaluation

## 2020-03-09 NOTE — Progress Notes (Signed)
Assisted Dr. Sabra Heck with right, ultrasound guided, popliteal block. Side rails up, monitors on throughout procedure. See vital signs in flow sheet. Tolerated Procedure well.

## 2020-03-09 NOTE — Discharge Instructions (Signed)
DR. [facility] ANKLE SURGERY POST-OP INSTRUCTIONS   Pain Management 1. The numbing medicine and your leg will last around 18 hours, take a dose of your pain medicine as soon as you feel it wearing off to avoid rebound pain. 2. Keep your foot elevated above heart level.  Make sure that your heel hangs free ('floats'). 3. Take all prescribed medication as directed. 4. If taking narcotic pain medication you may want to use an over-the-counter stool softener to avoid constipation. 5. You may take over-the-counter NSAIDs (ibuprofen, naproxen, etc.) as well as over-the-counter acetaminophen as directed on the packaging as a supplement for your pain and may also use it to wean away from the prescription medication.  Activity ? Heel WB in post operative shoe. ? Keep dressing in place  First Postoperative Visit 1. Your first postop visit will be at least 2 weeks after surgery.  This should be scheduled when you schedule surgery. 2. If you do not have a postoperative visit scheduled please call [phone] to schedule an appointment. 3. At the appointment your incision will be evaluated for suture removal, x-rays will be obtained if necessary.  General Instructions 1. Swelling is very common after foot and ankle surgery.  It often takes 3 months for the foot and ankle to begin to feel comfortable.  Some amount of swelling will persist for 6-12 months. 2. DO NOT change the dressing.  If there is a problem with the dressing (too tight, loose, gets wet, etc.) please contact Dr. Pollie Friar office. 3. DO NOT get the dressing wet.  For showers you can use an over-the-counter cast cover or wrap a washcloth around the top of your dressing and then cover it with a plastic bag and tape it to your leg. 4. DO NOT soak the incision (no tubs, pools, bath, etc.) until you have approval from Dr. Lucia Gaskins.  Contact Dr. Huel Cote office or go to Emergency Room if: 1. Temperature above 101 F. 2. Increasing pain that is  unresponsive to pain medication or elevation 3. Excessive redness or swelling in your foot 4. Dressing problems - excessive bloody drainage, looseness or tightness, or if dressing gets wet 5. Develop pain, swelling, warmth, or discoloration of your calf

## 2020-03-09 NOTE — H&P (Signed)
Lisa Moody is an 74 y.o. female.   Chief Complaint: Right symptomatic hallux valgus HPI: Patient is here today for surgical correction of her right hallux valgus.  She has had pain in this area for several months that is recalcitrant to conservative treatment the form of shoe modification, activity modification anti-inflammatories.  Pain is located along the medial eminence.  She denies any plantar forefoot pain or lesser toe pain.  She does have some valgus deviation of the second toe which is curving mostly through the middle phalanx and the distal interphalangeal joint.  Second MTP joint is well reduced and aligned.  Past Medical History:  Diagnosis Date  . Arthritis   . Asthma   . Back pain   . Benign neoplasm of colon   . Complication of anesthesia    anectine - caused her to have difficulty waking up and was on vent for few hours post op  . Complication of anesthesia    pulmonary embolism 2006  . Diverticulitis   . Diverticulosis of colon (without mention of hemorrhage)   . Endometriosis    reason for hysterectomy.  ovaries remain.   . Esophageal reflux    few times per month not routine  . Esophagitis, unspecified   . Headache(784.0)   . Osteopenia   . Osteopenia   . Other and unspecified hyperlipidemia   . Other and unspecified hyperlipidemia   . Other constipation   . Palpitations   . Personal history of other mental disorder   . PONV (postoperative nausea and vomiting)   . Pulmonary embolism ([place])    2006    Past Surgical History:  Procedure Laterality Date  . ABDOMINAL HYSTERECTOMY    . ABDOMINOPLASTY    . APPENDECTOMY    . CARDIAC CATHETERIZATION     09/06/10: normal coronaries and LV function (saw Dr. Rollene Fare in 2011)  . COLONOSCOPY    . JOINT REPLACEMENT     knee replacement  . LIPOSUCTION    . LUMBAR FUSION  March 2014  . LUMBAR LAMINECTOMY    . PARTIAL HYSTERECTOMY    . SHOULDER ARTHROSCOPY WITH ROTATOR CUFF REPAIR AND SUBACROMIAL DECOMPRESSION  Right 02/26/2015   Procedure: RIGHT SHOULDER ARTHROSCOPY DEBRIDEMENT WITH SUBACROMIAL DECOMPRESSION, , ROTATOR CUFF REPAIR ;  Surgeon: Elsie Saas, MD;  Location: [facility];  Service: Orthopedics;  Laterality: Right;  . TOTAL KNEE ARTHROPLASTY  2010   left  . TUBAL LIGATION      Family History  Problem Relation Age of Onset  . Tics Father   . Lung cancer Father   . Breast cancer Mother   . Diabetes Mother   . Diabetes Paternal Grandmother   . Colon cancer Neg Hx   . Esophageal cancer Neg Hx   . Rectal cancer Neg Hx   . Stomach cancer Neg Hx   . Colon polyps Neg Hx    Social History:  reports that she has never smoked. She has never used smokeless tobacco. She reports current alcohol use. She reports that she does not use drugs.  Allergies:  Allergies  Allergen Reactions  . Anectine  [Succinylcholine Chloride] Anaphylaxis  . Succinylcholine     Respiratory suppression   . Multi Vitamin-Fluoride [Multivitamins-Fluoride] Nausea Only    Facility-Administered Medications Prior to Admission  Medication Dose Route Frequency Provider Last Rate Last Admin  . 0.9 %  sodium chloride infusion  500 mL Intravenous Once Irene Shipper, MD       Medications Prior  to Admission  Medication Sig Dispense Refill  . atorvastatin (LIPITOR) 10 MG tablet Take 10 mg by mouth daily.    Marland Kitchen buPROPion (WELLBUTRIN XL) 150 MG 24 hr tablet Take 1 tablet (150 mg total) by mouth daily. 90 tablet 3  . docusate sodium (COLACE) 100 MG capsule Take 100 mg by mouth as needed.     . ezetimibe (ZETIA) 10 MG tablet Take 10 mg by mouth daily.    Marland Kitchen ibuprofen (ADVIL) 600 MG tablet Take 600 mg by mouth every 6 (six) hours as needed.    . pantoprazole (PROTONIX) 40 MG tablet TAKE 1 TABLET BY MOUTH 2 TIMES DAILY 60 tablet 6  . polyethylene glycol (MIRALAX / GLYCOLAX) packet Take 17 g by mouth daily as needed.     Marland Kitchen albuterol (VENTOLIN HFA) 108 (90 Base) MCG/ACT inhaler INHALE 2 PUFFS EVERY 4 HOURS AS NEEDED  FOR WHEEZING, COUGH, AND SHORTNESS OF BREATH    . diclofenac (VOLTAREN) 75 MG EC tablet Take 75 mg by mouth 2 (two) times daily.      No results found for this or any previous visit (from the past 48 hour(s)). No results found.  Review of Systems  Constitutional: Negative.   HENT: Negative.   Eyes: Negative.   Respiratory: Negative.   Cardiovascular: Negative.   Gastrointestinal: Negative.   Genitourinary: Negative.   Musculoskeletal:       Right bunion  Skin: Negative.   Neurological: Negative.   Psychiatric/Behavioral: Negative.     Blood pressure 128/89, pulse 71, temperature 97.7 F (36.5 C), temperature source Oral, resp. rate 11, height 5' 2.5" (1.588 m), weight 79.8 kg, SpO2 95 %. Physical Exam  Constitutional: She appears well-developed.  HENT:  Head: Normocephalic.  Eyes: Conjunctivae are normal.  Cardiovascular: Normal rate.  Respiratory: Effort normal.  GI: Soft.  Musculoskeletal:     Cervical back: Neck supple.     Comments: Right foot demonstrates bunion deformity.  There is some angulation of the second toe through the PIP joint and middle phalanx.  There is some valgus deviation through the distal interphalangeal joint as well.  No tenderness palpation at the second MTP joint or plantarly along the forefoot.  No tenderness proximal about the bunion.  Mild tenderness along the medial eminence.  Sensation grossly intact to light touch about the forefoot.  Foot is warm and well-perfused with palpable dorsalis pedis pulse.  Neurological: She is alert.  Skin: Skin is warm.  Psychiatric: She has a normal mood and affect.     Assessment/Plan We will plan for distal chevron osteotomy for bunion correction.  May require an Akin osteotomy but unlikely.  We will not address her second toe if she does not have symptoms with this yet.  Her deformity is coming through the distal interphalangeal joint.  At some point she may require correction however at this point due to her  asymptomatic state we will leave it alone.  She understands the risk, benefits and alternatives surgery which include but not limited to wound healing complications, infection, nonunion, malunion, need for further surgery as well as damage to surrounding structures.  She understands the postoperative weightbearing restrictions.  We will proceed with surgery.  Erle Crocker, MD 03/09/2020, 9:43 AM

## 2020-03-09 NOTE — Anesthesia Procedure Notes (Signed)
Anesthesia Regional Block: Popliteal block   Pre-Anesthetic Checklist: ,, timeout performed, Correct Patient, Correct Site, Correct Laterality, Correct Procedure, Correct Position, site marked, Risks and benefits discussed,  Surgical consent,  Pre-op evaluation,  At surgeon's request and post-op pain management  Laterality: Right  Prep: chloraprep       Needles:  Injection technique: Single-shot  Needle Type: Stimiplex     Needle Length: 9cm  Needle Gauge: 21     Additional Needles:   Procedures:,,,, ultrasound used (permanent image in chart),,,,  Narrative:  Start time: 03/09/2020 9:36 AM End time: 03/09/2020 9:41 AM Injection made incrementally with aspirations every 5 mL.  Performed by: Personally  Anesthesiologist: Lynda Rainwater, MD

## 2020-03-09 NOTE — Anesthesia Procedure Notes (Signed)
Procedure Name: MAC Date/Time: 03/09/2020 10:37 AM Performed by: Signe Colt, CRNA Pre-anesthesia Checklist: Patient identified, Emergency Drugs available, Suction available, Patient being monitored and Timeout performed Patient Re-evaluated:Patient Re-evaluated prior to induction Oxygen Delivery Method: Simple face mask

## 2020-03-09 NOTE — Transfer of Care (Signed)
Immediate Anesthesia Transfer of Care Note  Patient: TRACEE MCCREERY  Procedure(s) Performed: RIGHT DISTAL CHEVRON OSTEOTOMY BUNION CORRECTION (Right Foot)  Patient Location: PACU  Anesthesia Type:MAC combined with regional for post-op pain  Level of Consciousness: awake  Airway & Oxygen Therapy: Patient Spontanous Breathing and Patient connected to face mask oxygen  Post-op Assessment: Report given to RN and Post -op Vital signs reviewed and stable  Post vital signs: Reviewed and stable  Last Vitals:  Vitals Value Taken Time  BP 124/80 03/09/20 1123  Temp    Pulse 80 03/09/20 1124  Resp 16 03/09/20 1124  SpO2 99 % 03/09/20 1124  Vitals shown include unvalidated device data.  Last Pain:  Vitals:   03/09/20 0826  TempSrc: Oral  PainSc: 0-No pain         Complications: No apparent anesthesia complications

## 2020-03-10 ENCOUNTER — Encounter: Payer: Self-pay | Admitting: *Deleted

## 2020-03-10 NOTE — Addendum Note (Signed)
Addendum  created 03/10/20 0911 by Myna Bright, CRNA   Charge Capture section accepted

## 2020-03-24 DIAGNOSIS — M2011 Hallux valgus (acquired), right foot: Secondary | ICD-10-CM | POA: Diagnosis not present

## 2020-04-21 DIAGNOSIS — M2011 Hallux valgus (acquired), right foot: Secondary | ICD-10-CM | POA: Diagnosis not present

## 2020-04-30 DIAGNOSIS — M2011 Hallux valgus (acquired), right foot: Secondary | ICD-10-CM | POA: Diagnosis not present

## 2020-05-19 DIAGNOSIS — M48062 Spinal stenosis, lumbar region with neurogenic claudication: Secondary | ICD-10-CM | POA: Diagnosis not present

## 2020-06-02 DIAGNOSIS — M2041 Other hammer toe(s) (acquired), right foot: Secondary | ICD-10-CM | POA: Diagnosis not present

## 2020-06-03 DIAGNOSIS — M5116 Intervertebral disc disorders with radiculopathy, lumbar region: Secondary | ICD-10-CM | POA: Diagnosis not present

## 2020-06-03 DIAGNOSIS — M5416 Radiculopathy, lumbar region: Secondary | ICD-10-CM | POA: Diagnosis not present

## 2020-06-14 DIAGNOSIS — Z20822 Contact with and (suspected) exposure to covid-19: Secondary | ICD-10-CM | POA: Diagnosis not present

## 2020-06-14 DIAGNOSIS — J069 Acute upper respiratory infection, unspecified: Secondary | ICD-10-CM | POA: Diagnosis not present

## 2020-06-14 DIAGNOSIS — R070 Pain in throat: Secondary | ICD-10-CM | POA: Diagnosis not present

## 2020-07-12 DIAGNOSIS — Z419 Encounter for procedure for purposes other than remedying health state, unspecified: Secondary | ICD-10-CM | POA: Diagnosis not present

## 2020-07-12 DIAGNOSIS — L821 Other seborrheic keratosis: Secondary | ICD-10-CM | POA: Diagnosis not present

## 2020-07-12 DIAGNOSIS — L245 Irritant contact dermatitis due to other chemical products: Secondary | ICD-10-CM | POA: Diagnosis not present

## 2020-07-21 DIAGNOSIS — R040 Epistaxis: Secondary | ICD-10-CM | POA: Diagnosis not present

## 2020-07-27 ENCOUNTER — Emergency Department (HOSPITAL_COMMUNITY): Payer: BC Managed Care – PPO

## 2020-07-27 ENCOUNTER — Other Ambulatory Visit: Payer: Self-pay

## 2020-07-27 ENCOUNTER — Encounter (HOSPITAL_COMMUNITY): Payer: Self-pay | Admitting: Obstetrics and Gynecology

## 2020-07-27 DIAGNOSIS — S92424A Nondisplaced fracture of distal phalanx of right great toe, initial encounter for closed fracture: Secondary | ICD-10-CM | POA: Insufficient documentation

## 2020-07-27 DIAGNOSIS — W19XXXA Unspecified fall, initial encounter: Secondary | ICD-10-CM | POA: Diagnosis not present

## 2020-07-27 DIAGNOSIS — Y999 Unspecified external cause status: Secondary | ICD-10-CM | POA: Insufficient documentation

## 2020-07-27 DIAGNOSIS — S20219A Contusion of unspecified front wall of thorax, initial encounter: Secondary | ICD-10-CM | POA: Diagnosis not present

## 2020-07-27 DIAGNOSIS — S0181XA Laceration without foreign body of other part of head, initial encounter: Secondary | ICD-10-CM | POA: Diagnosis not present

## 2020-07-27 DIAGNOSIS — Y939 Activity, unspecified: Secondary | ICD-10-CM | POA: Diagnosis not present

## 2020-07-27 DIAGNOSIS — J45909 Unspecified asthma, uncomplicated: Secondary | ICD-10-CM | POA: Diagnosis not present

## 2020-07-27 DIAGNOSIS — Z7951 Long term (current) use of inhaled steroids: Secondary | ICD-10-CM | POA: Insufficient documentation

## 2020-07-27 DIAGNOSIS — Y929 Unspecified place or not applicable: Secondary | ICD-10-CM | POA: Diagnosis not present

## 2020-07-27 DIAGNOSIS — Z96652 Presence of left artificial knee joint: Secondary | ICD-10-CM | POA: Diagnosis not present

## 2020-07-27 DIAGNOSIS — S0990XA Unspecified injury of head, initial encounter: Secondary | ICD-10-CM | POA: Diagnosis not present

## 2020-07-27 DIAGNOSIS — Z23 Encounter for immunization: Secondary | ICD-10-CM | POA: Diagnosis not present

## 2020-07-27 DIAGNOSIS — Z043 Encounter for examination and observation following other accident: Secondary | ICD-10-CM | POA: Diagnosis not present

## 2020-07-27 DIAGNOSIS — S0191XA Laceration without foreign body of unspecified part of head, initial encounter: Secondary | ICD-10-CM | POA: Insufficient documentation

## 2020-07-27 DIAGNOSIS — Z79899 Other long term (current) drug therapy: Secondary | ICD-10-CM | POA: Diagnosis not present

## 2020-07-27 NOTE — ED Triage Notes (Signed)
Patient reports to the ER for fall with head injury. Patient reports she hit her head, injured her right foot and hit her chest

## 2020-07-28 ENCOUNTER — Emergency Department (HOSPITAL_COMMUNITY)
Admission: EM | Admit: 2020-07-28 | Discharge: 2020-07-28 | Disposition: A | Discharge status: Home / Self Care | Payer: BC Managed Care – PPO | Attending: Emergency Medicine | Admitting: Emergency Medicine

## 2020-07-28 DIAGNOSIS — S92424A Nondisplaced fracture of distal phalanx of right great toe, initial encounter for closed fracture: Secondary | ICD-10-CM

## 2020-07-28 DIAGNOSIS — S20219A Contusion of unspecified front wall of thorax, initial encounter: Secondary | ICD-10-CM

## 2020-07-28 DIAGNOSIS — S0181XA Laceration without foreign body of other part of head, initial encounter: Secondary | ICD-10-CM

## 2020-07-28 MED ORDER — BACITRACIN ZINC 500 UNIT/GM EX OINT
TOPICAL_OINTMENT | CUTANEOUS | Status: AC
  Filled 2020-07-28: qty 0.9

## 2020-07-28 MED ORDER — HYDROGEN PEROXIDE 3 % EX SOLN
CUTANEOUS | Status: AC
  Filled 2020-07-28: qty 473

## 2020-07-28 MED ORDER — LIDOCAINE-EPINEPHRINE 2 %-1:100000 IJ SOLN
20.0000 mL | Freq: Once | INTRAMUSCULAR | Status: AC
  Administered 2020-07-28: 20 mL
  Filled 2020-07-28: qty 1

## 2020-07-28 MED ORDER — TETANUS-DIPHTH-ACELL PERTUSSIS 5-2.5-18.5 LF-MCG/0.5 IM SUSP
0.5000 mL | Freq: Once | INTRAMUSCULAR | Status: AC
  Administered 2020-07-28: 0.5 mL via INTRAMUSCULAR
  Filled 2020-07-28: qty 0.5

## 2020-07-28 NOTE — ED Provider Notes (Signed)
[place] DEPT Provider Note   CSN: [id number] Arrival date & time: 07/27/20  1941     History Chief Complaint  Patient presents with  . Fall  . Head Injury    Lisa Moody is a 74 y.o. female.  Presents to the emergency department after a fall.  She reports falling to the ground earlier, hitting her forehead and her chest.  Patient also complaining of pain in her right great toe.        Past Medical History:  Diagnosis Date  . Arthritis   . Asthma   . Back pain   . Benign neoplasm of colon   . Complication of anesthesia    anectine - caused her to have difficulty waking up and was on vent for few hours post op  . Complication of anesthesia    pulmonary embolism 2006  . Diverticulitis   . Diverticulosis of colon (without mention of hemorrhage)   . Endometriosis    reason for hysterectomy.  ovaries remain.   . Esophageal reflux    few times per month not routine  . Esophagitis, unspecified   . Headache(784.0)   . Osteopenia   . Osteopenia   . Other and unspecified hyperlipidemia   . Other and unspecified hyperlipidemia   . Other constipation   . Palpitations   . Personal history of other mental disorder   . PONV (postoperative nausea and vomiting)   . Pulmonary embolism [facility])    2006    Patient Active Problem List   Diagnosis Date Noted  . S/P rotator cuff repair 07/25/2017  . Female bladder prolapse 07/12/2017  . Rectocele 07/12/2017  . Stress incontinence 07/12/2017  . Complete tear of right rotator cuff 08/13/2016  . Chronic right shoulder pain 08/11/2016  . Traumatic rotator cuff tear right shoulder 02/15/2015  . PONV (postoperative nausea and vomiting)   . Melanocytic nevus 02/17/2014  . Skin cancer 09/03/2013  . Pseudoptosis 02/29/2012  . NONSPEC ELEVATION OF LEVELS OF TRANSAMINASE/LDH 10/18/2010  . DIVERTICULITIS-COLON 09/28/2010  . DEGENERATIVE JOINT DISEASE, LEFT KNEE 09/15/2010  . PULMONARY EMBOLISM, HX  OF 09/15/2010  . RHINITIS 05/14/2008  . COLONIC POLYPS 01/22/2008  . ESOPHAGITIS 01/22/2008  . DIVERTICULAR DISEASE 01/22/2008  . CONSTIPATION, CHRONIC 01/22/2008  . DEPRESSION, HX OF 01/22/2008  . GASTROESOPHAGEAL REFLUX DISEASE, CHRONIC 08/13/2007  . HYPERLIPIDEMIA 01/06/2007    Past Surgical History:  Procedure Laterality Date  . ABDOMINAL HYSTERECTOMY    . ABDOMINOPLASTY    . APPENDECTOMY    . BUNIONECTOMY WITH WEIL OSTEOTOMY Right 03/09/2020   Procedure: RIGHT DISTAL CHEVRON OSTEOTOMY BUNION CORRECTION;  Surgeon: Erle Crocker, MD;  Location: [place];  Service: Orthopedics;  Laterality: Right;  PROCEDURE: RIGHT DISTAL CHEVRON OSTEOTOMY BUNION CORRECTION, POSSIBLE 2ND HAMMERTOE CORRECTION  SURGERY REQUEST TIME: 2 HOURS  . CARDIAC CATHETERIZATION     09/06/10: normal coronaries and LV function (saw Dr. Rollene Fare in 2011)  . COLONOSCOPY    . JOINT REPLACEMENT     knee replacement  . LIPOSUCTION    . LUMBAR FUSION  March 2014  . LUMBAR LAMINECTOMY    . PARTIAL HYSTERECTOMY    . SHOULDER ARTHROSCOPY WITH ROTATOR CUFF REPAIR AND SUBACROMIAL DECOMPRESSION Right 02/26/2015   Procedure: RIGHT SHOULDER ARTHROSCOPY DEBRIDEMENT WITH SUBACROMIAL DECOMPRESSION, , ROTATOR CUFF REPAIR ;  Surgeon: Elsie Saas, MD;  Location: [place];  Service: Orthopedics;  Laterality: Right;  . TOTAL KNEE ARTHROPLASTY  2010   left  .  TUBAL LIGATION       OB History    Gravida  3   Para  3   Term      Preterm      AB      Living  3     SAB      TAB      Ectopic      Multiple      Live Births              Family History  Problem Relation Age of Onset  . Tics Father   . Lung cancer Father   . Breast cancer Mother   . Diabetes Mother   . Diabetes Paternal Grandmother   . Colon cancer Neg Hx   . Esophageal cancer Neg Hx   . Rectal cancer Neg Hx   . Stomach cancer Neg Hx   . Colon polyps Neg Hx     Social History   Tobacco Use    . Smoking status: Never Smoker  . Smokeless tobacco: Never Used  Vaping Use  . Vaping Use: Never used  Substance Use Topics  . Alcohol use: Yes    Comment: socially  . Drug use: No    Home Medications Prior to Admission medications   Medication Sig Start Date End Date Taking? Authorizing Provider  albuterol (VENTOLIN HFA) 108 (90 Base) MCG/ACT inhaler INHALE 2 PUFFS EVERY 4 HOURS AS NEEDED FOR WHEEZING, COUGH, AND SHORTNESS OF BREATH 04/07/19   [provider]  atorvastatin (LIPITOR) 10 MG tablet Take 10 mg by mouth daily.    [provider]  buPROPion (WELLBUTRIN XL) 150 MG 24 hr tablet Take 1 tablet (150 mg total) by mouth daily. 12/17/19   Nunzio Cobbs, MD  diclofenac (VOLTAREN) 75 MG EC tablet Take 75 mg by mouth 2 (two) times daily. 08/25/19   [provider]  docusate sodium (COLACE) 100 MG capsule Take 100 mg by mouth as needed.     [provider]  ezetimibe (ZETIA) 10 MG tablet Take 10 mg by mouth daily. 01/10/17   [provider]  ibuprofen (ADVIL) 600 MG tablet Take 600 mg by mouth every 6 (six) hours as needed.    [provider]  pantoprazole (PROTONIX) 40 MG tablet TAKE 1 TABLET BY MOUTH 2 TIMES DAILY 03/01/20   Irene Shipper, MD  polyethylene glycol [facility] / GLYCOLAX) packet Take 17 g by mouth daily as needed.     [provider]    Allergies    Anectine  [succinylcholine chloride], Succinylcholine, and Multi vitamin-fluoride [multivitamins-fluoride]  Review of Systems   Review of Systems  Skin: Positive for wound.  Neurological: Positive for headaches.  All other systems reviewed and are negative.   Physical Exam Updated Vital Signs BP (!) 170/93 (BP Location: Right Arm)   Pulse (!) 106   Temp 99.1 F (37.3 C) (Oral)   Resp 18   SpO2 93%   Physical Exam Vitals and nursing note reviewed.  Constitutional:      General: She is not in acute distress.    Appearance: Normal appearance.  She is well-developed.  HENT:     Head: Normocephalic. Laceration (forehead) present.     Right Ear: Hearing normal.     Left Ear: Hearing normal.     Nose: Nose normal.  Eyes:     Conjunctiva/sclera: Conjunctivae normal.     Pupils: Pupils are equal, round, and reactive to light.  Cardiovascular:  Rate and Rhythm: Regular rhythm.     Heart sounds: S1 normal and S2 normal. No murmur heard.  No friction rub. No gallop.   Pulmonary:     Effort: Pulmonary effort is normal. No respiratory distress.     Breath sounds: Normal breath sounds.  Chest:     Chest wall: Tenderness present. No deformity or crepitus.  Abdominal:     General: Bowel sounds are normal.     Palpations: Abdomen is soft.     Tenderness: There is no abdominal tenderness. There is no guarding or rebound. Negative signs include Murphy's sign and McBurney's sign.     Hernia: No hernia is present.  Musculoskeletal:        General: Normal range of motion.     Cervical back: Normal range of motion and neck supple.     Right foot: Normal capillary refill. Tenderness (great toe) present. No deformity.     Comments: Bleeding from under nail, nail not avulsed, no obvious nailbed injury  Skin:    General: Skin is warm and dry.     Findings: Laceration present. No rash.  Neurological:     Mental Status: She is alert and oriented to person, place, and time.     GCS: GCS eye subscore is 4. GCS verbal subscore is 5. GCS motor subscore is 6.     Cranial Nerves: No cranial nerve deficit.     Sensory: No sensory deficit.     Coordination: Coordination normal.  Psychiatric:        Speech: Speech normal.        Behavior: Behavior normal.        Thought Content: Thought content normal.     ED Results / Procedures / Treatments   Labs (all labs ordered are listed, but only abnormal results are displayed) Labs Reviewed - No data to display  EKG None  Radiology DG Chest 2 View  Result Date: 07/27/2020 CLINICAL DATA:   Fall. EXAM: CHEST - 2 VIEW COMPARISON:  Chest x-ray dated February 13, 2013. FINDINGS: The heart size and mediastinal contours are within normal limits. Normal pulmonary vascularity. Minimal linear scarring at the peripheral left lung base, similar to prior study. No focal consolidation, pleural effusion, or pneumothorax. No acute osseous abnormality. IMPRESSION: 1. No acute cardiopulmonary disease. Electronically Signed   By: Titus Dubin M.D.   On: 07/27/2020 20:18   CT HEAD WO CONTRAST  Result Date: 07/27/2020 CLINICAL DATA:  Hit head.  Fall. EXAM: CT HEAD WITHOUT CONTRAST TECHNIQUE: Contiguous axial images were obtained from the base of the skull through the vertex without intravenous contrast. COMPARISON:  None. FINDINGS: Brain: No acute intracranial abnormality. Specifically, no hemorrhage, hydrocephalus, mass lesion, acute infarction, or significant intracranial injury. Vascular: No hyperdense vessel or unexpected calcification. Skull: No acute calvarial abnormality. Sinuses/Orbits: Visualized paranasal sinuses and mastoids clear. Orbital soft tissues unremarkable. Other: Soft tissue swelling in the right forehead IMPRESSION: No acute intracranial abnormality. Electronically Signed   By: Rolm Baptise M.D.   On: 07/27/2020 20:25   DG Foot Complete Right  Result Date: 07/27/2020 CLINICAL DATA:  Fall EXAM: RIGHT FOOT COMPLETE - 3+ VIEW COMPARISON:  07/13/2015 FINDINGS: Acute nondisplaced fracture involving the distal shaft of the first distal phalanx. No subluxation. Postsurgical deformity of the distal first metatarsal. Small plantar calcaneal spur IMPRESSION: Acute nondisplaced fracture involving the first distal phalanx. Electronically Signed   By: Donavan Foil M.D.   On: 07/27/2020 20:17    Procedures .Marland KitchenLaceration Repair  Date/Time: 07/28/2020 2:14 AM Performed by: Orpah Greek, MD Authorized by: Orpah Greek, MD   Consent:    Consent obtained:  Verbal   Consent given by:   Patient   Risks discussed:  Infection, pain and poor cosmetic result Universal protocol:    Procedure explained and questions answered to patient or proxy's satisfaction: yes     Relevant documents present and verified: yes     Test results available and properly labeled: yes     Imaging studies available: yes     Required blood products, implants, devices, and special equipment available: yes     Site/side marked: yes     Immediately prior to procedure, a time out was called: yes     Patient identity confirmed:  Verbally with patient Anesthesia (see MAR for exact dosages):    Anesthesia method:  Local infiltration   Local anesthetic:  Lidocaine 2% WITH epi Laceration details:    Location:  Face   Face location:  Forehead   Length (cm):  1.5 Repair type:    Repair type:  Simple Pre-procedure details:    Preparation:  Patient was prepped and draped in usual sterile fashion and imaging obtained to evaluate for foreign bodies Exploration:    Hemostasis achieved with:  Epinephrine Treatment:    Area cleansed with:  Betadine   Irrigation solution:  Sterile saline   Irrigation method:  Syringe Skin repair:    Repair method:  Sutures   Suture size:  5-0   Suture material:  Prolene   Number of sutures:  3 Approximation:    Approximation:  Close   (including critical care time)  Medications Ordered in ED Medications  hydrogen peroxide 3 % external solution (has no administration in time range)  Tdap (BOOSTRIX) injection 0.5 mL (has no administration in time range)  lidocaine-EPINEPHrine (XYLOCAINE W/EPI) 2 %-1:100000 (with pres) injection 20 mL (has no administration in time range)    ED Course  I have reviewed the triage vital signs and the nursing notes.  Pertinent labs & imaging results that were available during my care of the patient were reviewed by me and considered in my medical decision making (see chart for details).    MDM Rules/Calculators/A&P                           Patient presents to the emergency department for evaluation after a fall.  Patient did strike her head, no loss of consciousness.  No neurologic findings on exam.  She is complaining of headache.  CT scan, however, does not show any injury.  She does have a laceration of the forehead that required sutures.  Patient complaining of soreness across her anterior chest wall.  No deformity noted.  Chest x-ray clear.  Patient does have some abrasions on both of her knees but normal range of motion, no deformity, no bony tenderness.  Right great toe exam reveals diffuse tenderness without obvious deformity.  There is bleeding from under the nail but no visualized laceration.  Nail plate likely slightly separated from nail bed but no nailbed injury noted.  No repair necessary.  Final Clinical Impression(s) / ED Diagnoses Final diagnoses:  Forehead laceration, initial encounter  Contusion of chest wall, unspecified laterality, initial encounter  Closed nondisplaced fracture of distal phalanx of right great toe, initial encounter    Rx / DC Orders ED Discharge Orders    None  Orpah Greek, MD 07/28/20 [phone]

## 2020-07-28 NOTE — Discharge Instructions (Addendum)
Take Motrin and/or Tylenol as needed for your pain.  Follow-up with your doctor in 5 days to have sutures removed.

## 2020-08-02 DIAGNOSIS — M2041 Other hammer toe(s) (acquired), right foot: Secondary | ICD-10-CM | POA: Diagnosis not present

## 2020-08-03 DIAGNOSIS — E669 Obesity, unspecified: Secondary | ICD-10-CM | POA: Diagnosis not present

## 2020-08-03 DIAGNOSIS — Z23 Encounter for immunization: Secondary | ICD-10-CM | POA: Diagnosis not present

## 2020-08-05 ENCOUNTER — Telehealth: Payer: Self-pay

## 2020-08-05 NOTE — Telephone Encounter (Signed)
okay

## 2020-08-05 NOTE — Telephone Encounter (Signed)
Patient was seen by Dr. Rexene Alberts in the past as a referral from Dr. Crist Infante for concern of underlying sleep apnea. She reported snoring and excessive daytime somnolence. I reviewed Dr. Guadelupe Sabin notes and agree with her assessment. Patient did not follow Dr. Guadelupe Sabin recommendations to have a sleep evaluation. If patient has sleep apnea this can be causing her headaches; If sleep apnea is affecting her headaches, that needs to treated or her headaches will not improve. I do not manage sleep apnea and cannot provide that testing for her. I recommend she see Dr. Rexene Alberts and complete the testing for sleep apnea as recommended and be treated if necessary. If sleep apnea testing is negative then Dr. Rexene Alberts can discuss with me if transfer is appropriate. If sleep testing is positive I recommend she be treated and see how that affects her headaches and then Dr. Rexene Alberts can discuss with me if transfer is appropriate. Thank you.

## 2020-08-05 NOTE — Telephone Encounter (Signed)
Patient is requesting a provider switch from Dr. Rexene Alberts to Dr. Jaynee Eagles. Please advise

## 2020-08-16 ENCOUNTER — Telehealth: Payer: Self-pay | Admitting: Neurology

## 2020-08-16 NOTE — Telephone Encounter (Signed)
I called pt to reschedule sleep consult. No answer, left message for pt to call me back to reschedule sleep consult with Dr. Rexene Alberts.

## 2020-08-19 ENCOUNTER — Encounter: Payer: Self-pay | Admitting: Neurology

## 2020-08-19 ENCOUNTER — Ambulatory Visit (INDEPENDENT_AMBULATORY_CARE_PROVIDER_SITE_OTHER): Payer: BC Managed Care – PPO | Admitting: Neurology

## 2020-08-19 ENCOUNTER — Other Ambulatory Visit: Payer: Self-pay

## 2020-08-19 VITALS — BP 126/84 | HR 79 | Ht 62.5 in | Wt 174.0 lb

## 2020-08-19 DIAGNOSIS — R519 Headache, unspecified: Secondary | ICD-10-CM

## 2020-08-19 DIAGNOSIS — E669 Obesity, unspecified: Secondary | ICD-10-CM | POA: Diagnosis not present

## 2020-08-19 DIAGNOSIS — R0683 Snoring: Secondary | ICD-10-CM

## 2020-08-19 DIAGNOSIS — R351 Nocturia: Secondary | ICD-10-CM | POA: Diagnosis not present

## 2020-08-19 DIAGNOSIS — E66811 Obesity, class 1: Secondary | ICD-10-CM

## 2020-08-19 NOTE — Progress Notes (Signed)
Subjective:    Patient ID: Lisa Moody is a 74 y.o. female.  HPI     Lisa Age, MD, PhD [facility] [address], [address] P.O. [place], [place] [address]  Dear Dr. Joylene Moody,   I saw your patient, Lisa Moody, upon your kind request in my neurologic clinic today for consultation of her recurrent headaches and reevaluation of underlying sleep disordered breathing.  The patient is unaccompanied today.  I have previously evaluated her some 4 years ago for concern for sleep apnea but she was not able to pursue a sleep study testing at the time.  As you know, Lisa Moody is a 74 year old right-handed woman with an underlying medical history of diverticulitis, hyperlipidemia, history of headaches, arthritis, low back pain, knee pain, status post back surgery, status post left knee replacement, status post right rotator cuff surgery, history of recent fall with laceration to the right forehead in September 2021 and mild obesity, who reports increase in her headaches in the past few months.  She has never been labeled with migraines.  She does have a family history of migraine headaches affecting her mother and her sister and her father also had recurrent headaches which were not labeled as migraines, probably more sinus related in his case.  She does snore.  She does not sleep well at night, she has significant sleep disruption from nocturia, 3-4 times on average.  She had a fall earlier in September and sustained a right forehead laceration which had to be sutured.  She recently had his sutures removed.  I reviewed your office note from 08/03/2020.  She was given an oral prednisone taper for 6 days at the time, starting at 30 mg daily.  She has never been on migraine medication.  She has a bedtime of 10-10 30 p.m. and rise time between 730 and 8 AM.  She has woken up with a headache.  Her weight has increased a little bit in the past year.  She has limited her caffeine to  coffee, 2 cups in the mornings.  She drinks alcohol occasionally on the weekend.  She is a non-smoker.  She had a head CT in the emergency room after the fall and it did not show any acute injuries.  Her Epworth sleepiness score is 5 out of 24, fatigue severity score 12 out of 63.  Previously:  08/21/2016: 74 year old right-handed woman with an underlying medical history of history of PE in 2008, hyperlipidemia, diverticulitis, recurrent headaches, arthritis, low back pain, status post lumbar laminectomy, status post left knee replacement surgery, s/p R rotator cuff surgery in 4/16, and history of obesity, who reports snoring and excessive daytime somnolence, as well as morning headaches and nocturia. Her Epworth sleepiness score is 10 out of 24 today, her fatigue score is 23 out of 63. I reviewed your office note from 08/02/2016, which you kindly included. Her BP at your office was elevated for her, borderline in general terms, but she recalls having had a HA that day. She will need rotator cuff surgery on the R. Had prior surgery.  Bedtime is between 10 and 10:30 PM. Wakeup time is around 7:30, on weekends or vacation around 8:30. She does like to nap, tries to limit her nap to less than 1 hour. She denies restless leg symptoms. She is not aware of any family history of OSA. She is a nonsmoker, drinks alcohol socially, mainly on weekends and drinks caffeine in the form of coffee, 2 cups  per day. She has 3 grown children and 11 grandchildren. She does not drink sodas. She is trying to lose weight. She has joined [employer].  Her Past Medical History Is Significant For: Past Medical History:  Diagnosis Date  . Arthritis   . Asthma   . Back pain   . Benign neoplasm of colon   . Complication of anesthesia    anectine - caused her to have difficulty waking up and was on vent for few hours post op  . Complication of anesthesia    pulmonary embolism 2006  . Diverticulitis   . Diverticulosis of  colon (without mention of hemorrhage)   . Endometriosis    reason for hysterectomy.  ovaries remain.   . Esophageal reflux    few times per month not routine  . Esophagitis, unspecified   . Headache(784.0)   . Osteopenia   . Osteopenia   . Other and unspecified hyperlipidemia   . Other and unspecified hyperlipidemia   . Other constipation   . Palpitations   . Personal history of other mental disorder   . PONV (postoperative nausea and vomiting)   . Pulmonary embolism ([place])    2006    Her Past Surgical History Is Significant For: Past Surgical History:  Procedure Laterality Date  . ABDOMINAL HYSTERECTOMY    . ABDOMINOPLASTY    . APPENDECTOMY    . BUNIONECTOMY WITH WEIL OSTEOTOMY Right 03/09/2020   Procedure: RIGHT DISTAL CHEVRON OSTEOTOMY BUNION CORRECTION;  Surgeon: Erle Crocker, MD;  Location: [place];  Service: Orthopedics;  Laterality: Right;  PROCEDURE: RIGHT DISTAL CHEVRON OSTEOTOMY BUNION CORRECTION, POSSIBLE 2ND HAMMERTOE CORRECTION  SURGERY REQUEST TIME: 2 HOURS  . CARDIAC CATHETERIZATION     09/06/10: normal coronaries and LV function (saw Dr. Rollene Fare in 2011)  . COLONOSCOPY    . JOINT REPLACEMENT     knee replacement  . LIPOSUCTION    . LUMBAR FUSION  March 2014  . LUMBAR LAMINECTOMY    . PARTIAL HYSTERECTOMY    . SHOULDER ARTHROSCOPY WITH ROTATOR CUFF REPAIR AND SUBACROMIAL DECOMPRESSION Right 02/26/2015   Procedure: RIGHT SHOULDER ARTHROSCOPY DEBRIDEMENT WITH SUBACROMIAL DECOMPRESSION, , ROTATOR CUFF REPAIR ;  Surgeon: Elsie Saas, MD;  Location: [place];  Service: Orthopedics;  Laterality: Right;  . TOTAL KNEE ARTHROPLASTY  2010   left  . TUBAL LIGATION      Her Family History Is Significant For: Family History  Problem Relation Moody of Onset  . Tics Father   . Lung cancer Father   . Breast cancer Mother   . Diabetes Mother   . Diabetes Paternal Grandmother   . Colon cancer Neg Hx   . Esophageal cancer Neg  Hx   . Rectal cancer Neg Hx   . Stomach cancer Neg Hx   . Colon polyps Neg Hx     Her Social History Is Significant For: Social History   Socioeconomic History  . Marital status: Married    Spouse name: Not on file  . Number of children: 3  . Years of education: college  . Highest education level: Not on file  Occupational History  . Occupation: retired    Fish farm manager: UNEMPLOYED  Tobacco Use  . Smoking status: Never Smoker  . Smokeless tobacco: Never Used  Vaping Use  . Vaping Use: Never used  Substance and Sexual Activity  . Alcohol use: Yes    Comment: socially  . Drug use: No  . Sexual activity: Yes  Partners: Male    Birth control/protection: Surgical    Comment: TBL/TAH  Other Topics Concern  . Not on file  Social History Narrative   Drinks 2 cup coffee a day    Social Determinants of Health   Financial Resource Strain:   . Difficulty of Paying Living Expenses: Not on file  Food Insecurity:   . Worried About Charity fundraiser in the Last Year: Not on file  . Ran Out of Food in the Last Year: Not on file  Transportation Needs:   . Lack of Transportation (Medical): Not on file  . Lack of Transportation (Non-Medical): Not on file  Physical Activity:   . Days of Exercise per Week: Not on file  . Minutes of Exercise per Session: Not on file  Stress:   . Feeling of Stress : Not on file  Social Connections:   . Frequency of Communication with Friends and Family: Not on file  . Frequency of Social Gatherings with Friends and Family: Not on file  . Attends Religious Services: Not on file  . Active Member of Clubs or Organizations: Not on file  . Attends Archivist Meetings: Not on file  . Marital Status: Not on file    Her Allergies Are:  Allergies  Allergen Reactions  . Anectine  [Succinylcholine Chloride] Anaphylaxis  . Succinylcholine     Respiratory suppression   . Multi Vitamin-Fluoride [Multivitamins-Fluoride] Nausea Only  :   Her  Current Medications Are:  Outpatient Encounter Medications as of 08/19/2020  Medication Sig  . albuterol (VENTOLIN HFA) 108 (90 Base) MCG/ACT inhaler INHALE 2 PUFFS EVERY 4 HOURS AS NEEDED FOR WHEEZING, COUGH, AND SHORTNESS OF BREATH  . atorvastatin (LIPITOR) 10 MG tablet Take 10 mg by mouth daily.  Marland Kitchen buPROPion (WELLBUTRIN XL) 150 MG 24 hr tablet Take 1 tablet (150 mg total) by mouth daily.  . diclofenac (VOLTAREN) 75 MG EC tablet Take 75 mg by mouth. PRN  . docusate sodium (COLACE) 100 MG capsule Take 100 mg by mouth as needed.   . ezetimibe (ZETIA) 10 MG tablet Take 10 mg by mouth daily.  Marland Kitchen ibuprofen (ADVIL) 600 MG tablet Take 600 mg by mouth every 6 (six) hours as needed.  . pantoprazole (PROTONIX) 40 MG tablet TAKE 1 TABLET BY MOUTH 2 TIMES DAILY  . polyethylene glycol (MIRALAX / GLYCOLAX) packet Take 17 g by mouth daily as needed.    No facility-administered encounter medications on file as of 08/19/2020.  :  Review of Systems:  Out of a complete 14 point review of systems, all are reviewed and negative with the exception of these symptoms as listed below: Review of Systems  Neurological:       Here for sleep consult. No prior sleep study.  Does reports hx of h/a. Wanted to r/o osa as a cause.  Epworth Sleepiness Scale 0= would never doze 1= slight chance of dozing 2= moderate chance of dozing 3= high chance of dozing  Sitting and reading:1 Watching TV:1 Sitting inactive in a public place (ex. Theater or meeting):1 As a passenger in a car for an hour without a break:1 Lying down to rest in the afternoon:1 Sitting and talking to someone:0 Sitting quietly after lunch (no alcohol):1 In a car, while stopped in traffic:0 Total:5     Objective:  Neurological Exam  Physical Exam Physical Examination:   Vitals:   08/19/20 1105  BP: 126/84  Pulse: 79  SpO2: 94%    General Examination:  The patient is a very pleasant 74 y.o. female in no acute distress. She appears  well-developed and well-nourished and well groomed.   HEENT: Normocephalic, well-healing scar right forehead.  Corrective eyeglasses in place.  Pupils are equal, round and reactive to light. Extraocular tracking is good without limitation to gaze excursion or nystagmus noted. Normal smooth pursuit is noted. Hearing is grossly intact. Face is symmetric with normal facial animation and normal facial sensation. Speech is clear with no dysarthria noted. There is no hypophonia. There is no lip, neck/head, jaw or voice tremor. Neck is supple with full range of passive and active motion. There are no carotid bruits on auscultation. Oropharynx exam reveals: mild mouth dryness, adequate dental hygiene and moderate airway crowding, due to smaller airway entry, redundant soft palate. Tonsils are small. Mallampati is class II. Tongue protrudes centrally and palate elevates symmetrically. Neck size is 14.75 inches. She has a Mild overbite.  Chest: Clear to auscultation without wheezing, rhonchi or crackles noted.  Heart: S1+S2+0, regular and normal without murmurs, rubs or gallops noted.   Abdomen: Soft, non-tender and non-distended.  Extremities: There is no pitting edema in the distal lower extremities bilaterally.   Skin: Warm and dry without trophic changes noted.  Musculoskeletal: exam reveals no obvious joint deformities, tenderness or joint swelling or erythema.   Neurologically:  Mental status: The patient is awake, alert and oriented in all 4 spheres. Her immediate and remote memory, attention, language skills and fund of knowledge are appropriate. There is no evidence of aphasia, agnosia, apraxia or anomia. Speech is clear with normal prosody and enunciation. Thought process is linear. Mood is normal and affect is normal.  Cranial nerves II - XII are as described above under HEENT exam. In addition: shoulder shrug is normal with equal shoulder height noted. Motor exam: Normal bulk, strength  and tone is noted. There is no drift, tremor or rebound. Fine motor skills and coordination: Grossly intact.  Cerebellar testing: No dysmetria or intention tremor. There is no truncal or gait ataxia.  Sensory exam: intact to light touch in the upper and lower extremities.  Gait, station and balance: She stands easily. No veering to one side is noted. No leaning to one side is noted. Posture is Moody-appropriate and stance is narrow based. Gait shows normal stride length and pace. No trouble turning.         Assessment and Plan:  In summary, JERRILYN MESSINGER is a very pleasant 74 year old female with an underlying medical history of history of PE in 2008, hyperlipidemia, diverticulitis, recurrent headaches, arthritis, low back pain, status post lumbar laminectomy, status post left knee replacement surgery, and history of obesity, history of recent fall earlier in September 2021 with laceration to forehead, who presents for evaluation of her recurrent headaches and reevaluation of her sleep disturbance.  She has a history and examination concerning for underlying sleep disordered breathing.  She has a family history of migraines.  She was not able to pursue sleep study testing previously but would be willing to pursue it at this time, we will seek insurance authorization for sleep testing as underlying obstructive sleep apnea may very well be a contributor to her increased headaches.  She had a recent head CT in September 2021 without any acute findings thankfully.  She would be reluctant to pursue CPAP or AutoPap therapy, would be more in favor of trying a dental device.  We talked about different treatment options including surgical options such as the implantable  hypoglossal nerve stimulator.  We will pick up our discussion after sleep testing is completed.  I plan to see her back after testing.  I answered all her questions today and she was in agreement.  Thank you very much for allowing me to participate  in the care of this nice patient. If I can be of any further assistance to you please do not hesitate to call me at [phone].  Sincerely,   Lisa Age, MD, PhD

## 2020-08-19 NOTE — Patient Instructions (Signed)
It was good to see you again today.  As discussed, untreated obstructive sleep apnea could be a contributor to your headaches. I think we should proceed with a sleep study to determine whether you do or do not have OSA and how severe it is. Even, if you have mild OSA, I may want you to consider treatment with CPAP, as treatment of even borderline or mild sleep apnea can result and improvement of symptoms such as sleep disruption, daytime sleepiness, nighttime bathroom breaks, restless leg symptoms, improvement of headache syndromes, even improved mood disorder.   As explained, an attended sleep study meaning you get to stay overnight in the sleep lab, lets [place] monitor sleep-related behaviors such as sleep talking and leg movements in sleep, in addition to monitoring for sleep apnea.  A home sleep test is a screening tool for sleep apnea only, and unfortunately does not help with any other sleep-related diagnoses.  Please remember, the long-term risks and ramifications of untreated moderate to severe obstructive sleep apnea are: increased Cardiovascular disease, including congestive heart failure, stroke, difficult to control hypertension, treatment resistant obesity, arrhythmias, especially irregular heartbeat commonly known as A. Fib. (atrial fibrillation); even type 2 diabetes has been linked to untreated OSA.   Sleep apnea can cause disruption of sleep and sleep deprivation in most cases, which, in turn, can cause recurrent headaches, problems with memory, mood, concentration, focus, and vigilance. Most people with untreated sleep apnea report excessive daytime sleepiness, which can affect their ability to drive. Please do not drive if you feel sleepy. Patients with sleep apnea can also develop difficulty initiating and maintaining sleep (aka insomnia).   Having sleep apnea may increase your risk for other sleep disorders, including involuntary behaviors sleep such as sleep terrors, sleep talking,  sleepwalking.    Having sleep apnea can also increase your risk for restless leg syndrome and leg movements at night.   Please note that untreated obstructive sleep apnea may carry additional perioperative morbidity. Patients with significant obstructive sleep apnea (typically, in the moderate to severe degree) should receive, if possible, perioperative PAP (positive airway pressure) therapy and the surgeons and particularly the anesthesiologists should be informed of the diagnosis and the severity of the sleep disordered breathing.   I will likely see you back after your sleep study to go over the test results and where to go from there. We will call you after your sleep study to advise about the results (most likely, you will hear from [facility], my nurse) and to set up an appointment at the time, as necessary.    Our sleep lab administrative assistant will call you to schedule your sleep study and give you further instructions, regarding the check in process for the sleep study, arrival time, what to bring, when you can expect to leave after the study, etc., and to answer any other logistical questions you may have. If you don't hear back from her by about 2 weeks from now, please feel free to call her direct line at [phone] or you can call our general clinic number, or email [place] through My Chart.

## 2020-08-26 ENCOUNTER — Telehealth: Payer: Self-pay

## 2020-08-26 NOTE — Telephone Encounter (Signed)
Pt indicated she will call back next week to schedule sleep study

## 2020-09-02 ENCOUNTER — Telehealth: Payer: Self-pay

## 2020-09-02 DIAGNOSIS — M5116 Intervertebral disc disorders with radiculopathy, lumbar region: Secondary | ICD-10-CM | POA: Diagnosis not present

## 2020-09-02 DIAGNOSIS — M5416 Radiculopathy, lumbar region: Secondary | ICD-10-CM | POA: Diagnosis not present

## 2020-09-02 NOTE — Telephone Encounter (Signed)
2

## 2020-09-02 NOTE — Telephone Encounter (Signed)
LVM for pt to call me back to schedule sleep study  

## 2020-09-07 ENCOUNTER — Telehealth: Payer: Self-pay

## 2020-09-07 NOTE — Telephone Encounter (Signed)
LVM on home and cell #s for pt to call me back to schedule sleep study

## 2020-09-08 ENCOUNTER — Institutional Professional Consult (permissible substitution): Payer: Medicare Other | Admitting: Neurology

## 2020-09-13 DIAGNOSIS — R059 Cough, unspecified: Secondary | ICD-10-CM | POA: Diagnosis not present

## 2020-09-13 DIAGNOSIS — J189 Pneumonia, unspecified organism: Secondary | ICD-10-CM | POA: Diagnosis not present

## 2020-09-13 DIAGNOSIS — J45998 Other asthma: Secondary | ICD-10-CM | POA: Diagnosis not present

## 2020-10-06 ENCOUNTER — Ambulatory Visit (INDEPENDENT_AMBULATORY_CARE_PROVIDER_SITE_OTHER): Payer: BC Managed Care – PPO | Admitting: Neurology

## 2020-10-06 ENCOUNTER — Other Ambulatory Visit: Payer: Self-pay

## 2020-10-06 DIAGNOSIS — R351 Nocturia: Secondary | ICD-10-CM

## 2020-10-06 DIAGNOSIS — R0683 Snoring: Secondary | ICD-10-CM

## 2020-10-06 DIAGNOSIS — R519 Headache, unspecified: Secondary | ICD-10-CM

## 2020-10-06 DIAGNOSIS — E669 Obesity, unspecified: Secondary | ICD-10-CM

## 2020-10-06 DIAGNOSIS — G4720 Circadian rhythm sleep disorder, unspecified type: Secondary | ICD-10-CM

## 2020-10-06 DIAGNOSIS — G4733 Obstructive sleep apnea (adult) (pediatric): Secondary | ICD-10-CM

## 2020-10-21 ENCOUNTER — Telehealth: Payer: Self-pay | Admitting: Neurology

## 2020-10-21 NOTE — Procedures (Signed)
PATIENT'S NAME:  Lisa Moody, Lisa Moody DOB:      10/25/1946      MR#:    [id number]     DATE OF RECORDING: 10/06/2020 REFERRING M.D.:  Crist Infante MD Study Performed:   Baseline Polysomnogram HISTORY: 74 year old right-handed woman with a history of diverticulitis, hyperlipidemia, history of headaches, arthritis, low back pain, knee pain, status post back surgery, status post left knee replacement, status post right rotator cuff surgery, history of recent fall with laceration to the right forehead in September 2021 and mild obesity, who reports increase in her headaches, including morning headaches. She has a history of snoring. The patient endorsed the Epworth Sleepiness Scale at 5 points. The patient's weight 174 pounds with a height of 62.2 (inches), resulting in a BMI of 32. kg/m2. The patient's neck circumference measured 14.8 inches.  CURRENT MEDICATIONS: Ventolin, Lipitor, Wellbutrin, Voltaren, Colace, Zetia, Advil, Protonix, Mirilax   PROCEDURE:  This is a multichannel digital polysomnogram utilizing the Somnostar 11.2 system.  Electrodes and sensors were applied and monitored per AASM Specifications.   EEG, EOG, Chin and Limb EMG, were sampled at 200 Hz.  ECG, Snore and Nasal Pressure, Thermal Airflow, Respiratory Effort, CPAP Flow and Pressure, Oximetry was sampled at 50 Hz. Digital video and audio were recorded.      BASELINE STUDY  Lights Out was at 22:00 and Lights On at 05:01.  Total recording time (TRT) was 421 minutes, with a total sleep time (TST) of 320.5 minutes.   The patient's sleep latency was 54 minutes, which is delayed. REM latency was 86 minutes, which is normal. The sleep efficiency was 76.1 %.     SLEEP ARCHITECTURE: WASO (Wake after sleep onset) was 46.5 minutes with mild to moderate sleep fragmentation noted. There were 30 minutes in Stage N1, 186.5 minutes Stage N2, 37 minutes Stage N3 and 67 minutes in Stage REM.  The percentage of Stage N1 was 9.4%, which is increased,  Stage N2 was 58.2%, which is mildly increased, Stage N3 was 11.5% and Stage R (REM sleep) was 20.9%, which is normal. The arousals were noted as: 143 were spontaneous, 0 were associated with PLMs, 36 were associated with respiratory events.  RESPIRATORY ANALYSIS:  There were a total of 80 respiratory events:  0 obstructive apneas, 0 central apneas and 0 mixed apneas with a total of 0 apneas and an apnea index (AI) of 0 /hour. There were 80 hypopneas with a hypopnea index of 15. /hour. The patient also had 0 respiratory event related arousals (RERAs).      The total APNEA/HYPOPNEA INDEX (AHI) was 15./hour and the total RESPIRATORY DISTURBANCE INDEX was  15. /hour.  41 events occurred in REM sleep and 78 events in NREM. The REM AHI was  36.7 /hour, versus a non-REM AHI of 9.2. The patient spent 0 minutes of total sleep time in the supine position and 321 minutes in non-supine.. The supine AHI was n/a versus a non-supine AHI of 15.0.  OXYGEN SATURATION & C02:  The Wake baseline 02 saturation was 94%, with the lowest being 82%. Time spent below 89% saturation equaled 23 minutes. PERIODIC LIMB MOVEMENTS: The patient had a total of 0 Periodic Limb Movements.  The Periodic Limb Movement (PLM) index was 0 and the PLM Arousal index was 0/hour.  Audio and video analysis did not show any abnormal or unusual movements, behaviors, phonations or vocalizations. The patient took 1 bathroom break. Snoring was noted, ranging from mild to loud. The EKG was in keeping  with normal sinus rhythm (NSR).  Post-study, the patient indicated that sleep was the same as usual.   IMPRESSION:  1. Obstructive Sleep Apnea (OSA) 2. Dysfunctions associated with sleep stages or arousal from sleep  RECOMMENDATIONS:  1. This study demonstrates moderate to severe obstructive sleep apnea, with a total AHI of 15/hour, REM AHI of 36.7/hour, and O2 nadir of 82%. The absence of supine sleep may lead to an underestimation of her sleep  disordered breathing. Treatment with positive airway pressure in the form of CPAP is recommended. This will require, ideally, a full night titration study to optimize therapy. Other treatment options may include avoidance of supine sleep position along with weight loss, upper airway or jaw surgery in selected patients or the use of an oral appliance in certain patients. ENT evaluation and/or consultation with a maxillofacial surgeon or dentist may be feasible in some instances.    2. Please note that untreated obstructive sleep apnea may carry additional perioperative morbidity. Patients with significant obstructive sleep apnea should receive perioperative PAP therapy and the surgeons and particularly the anesthesiologist should be informed of the diagnosis and the severity of the sleep disordered breathing. 3. This study shows sleep fragmentation and mildly abnormal sleep stage percentages; these are nonspecific findings and per se do not signify an intrinsic sleep disorder or a cause for the patient's sleep-related symptoms. Causes include (but are not limited to) the first night effect of the sleep study, circadian rhythm disturbances, medication effect or an underlying mood disorder or medical problem.  4. The patient should be cautioned not to drive, work at heights, or operate dangerous or heavy equipment when tired or sleepy. Review and reiteration of good sleep hygiene measures should be pursued with any patient. 5. The patient will be seen in follow-up in the sleep clinic at [facility] for discussion of the test results, symptom and treatment compliance review, further management strategies, etc. The referring provider will be notified of the test results.  I certify that I have reviewed the entire raw data recording prior to the issuance of this report in accordance with the Standards of Accreditation of the American Academy of Sleep Medicine (AASM)  Star Age, MD, PhD Diplomat, American Board of  Neurology and Sleep Medicine (Neurology and Sleep Medicine)

## 2020-10-21 NOTE — Telephone Encounter (Signed)
Called patient to discuss sleep study results. No answer at this time. LVM for the patient to call back.   

## 2020-10-21 NOTE — Telephone Encounter (Signed)
Pt returned call and I was able to review the sleep study with her in detail. Advised the patient of the findings. I reviewed that Dr Rexene Alberts recommends the patient come in for a titration study. Patient initially was adament that she did not want to move forward with CPAP treatment. She is interested in the dental device. I informed the patient that with her apnea the fact it increased into the more severe range during the REM stage, the dental device would not provide effective treatment for the sleep apnea. The pt asked that I reviewed this again with her husband. I once more reviewed the sleep study in detail. Pt will discuss further with his wife and try and talk her into the trying the CPAP. Advised in meantime our sleep lab will work on Biochemist, clinical for the study. Advised they will call to schedule and they can advise if they would like to continue moving forward or schedule office visit to discuss with Dr Rexene Alberts further.

## 2020-10-21 NOTE — Addendum Note (Signed)
Addended by: Star Age on: 10/21/2020 11:25 AM   Modules accepted: Orders

## 2020-10-21 NOTE — Telephone Encounter (Signed)
-----   Message from Star Age, MD sent at 10/21/2020 11:25 AM EST ----- Patient referred by Dr. Joylene Draft for HAs and re-eval for OSA concern, seen by me on 08/19/20, diagnostic PSG on 10/06/20.   Please call and notify the patient that the recent sleep study showed moderate to severe obstructive sleep apnea. I recommend treatment for this in the form of CPAP. This will require a repeat sleep study for proper titration and mask fitting and correct monitoring of the oxygen saturations. Please explain to patient. I have placed an order in the chart. Thanks.  Star Age, MD, PhD [facility])

## 2020-10-21 NOTE — Progress Notes (Signed)
Patient referred by Dr. Joylene Draft for HAs and re-eval for OSA concern, seen by me on 08/19/20, diagnostic PSG on 10/06/20.   Please call and notify the patient that the recent sleep study showed moderate to severe obstructive sleep apnea. I recommend treatment for this in the form of CPAP. This will require a repeat sleep study for proper titration and mask fitting and correct monitoring of the oxygen saturations. Please explain to patient. I have placed an order in the chart. Thanks.  Star Age, MD, PhD [facility])

## 2020-10-25 ENCOUNTER — Other Ambulatory Visit: Payer: Self-pay | Admitting: Internal Medicine

## 2020-10-28 ENCOUNTER — Telehealth: Payer: Self-pay

## 2020-10-28 DIAGNOSIS — G4733 Obstructive sleep apnea (adult) (pediatric): Secondary | ICD-10-CM

## 2020-10-28 NOTE — Telephone Encounter (Signed)
[employer] denied cpap titration. Did not realize she had secondary insurance with [place] [employer] when we did PSG. Will do a retro approval on charge.  She has medicare but [employer] will have to be billed. Need auto cpap ordered for patient.

## 2020-10-28 NOTE — Telephone Encounter (Signed)
We will set patient up with autoPAP at home, as insurance denied in house titration study for OSA. Pls process order and notify patient and set up FU in 10 weeks with me or NP.     

## 2020-11-01 NOTE — Telephone Encounter (Signed)
I called pt. I discussed this with her. She is not interested in trying auto-pap. She would prefer to have an appt with Dr. Rexene Alberts to discuss alternative options. I advised her that moderate to severe osa is best treated with PAP therapy. She would potentially be interested in a dental device. An appt was made for 12/14/20 at 8:30am with Dr. Rexene Alberts. Pt verbalized understanding of recommendations. Pt had no questions at this time but was encouraged to call back if questions arise.

## 2020-11-02 DIAGNOSIS — M5416 Radiculopathy, lumbar region: Secondary | ICD-10-CM | POA: Diagnosis not present

## 2020-11-02 DIAGNOSIS — M5116 Intervertebral disc disorders with radiculopathy, lumbar region: Secondary | ICD-10-CM | POA: Diagnosis not present

## 2020-11-23 DIAGNOSIS — L820 Inflamed seborrheic keratosis: Secondary | ICD-10-CM | POA: Diagnosis not present

## 2020-11-23 DIAGNOSIS — L821 Other seborrheic keratosis: Secondary | ICD-10-CM | POA: Diagnosis not present

## 2020-11-23 DIAGNOSIS — D225 Melanocytic nevi of trunk: Secondary | ICD-10-CM | POA: Diagnosis not present

## 2020-11-23 DIAGNOSIS — Z85828 Personal history of other malignant neoplasm of skin: Secondary | ICD-10-CM | POA: Diagnosis not present

## 2020-11-23 DIAGNOSIS — D1801 Hemangioma of skin and subcutaneous tissue: Secondary | ICD-10-CM | POA: Diagnosis not present

## 2020-11-23 DIAGNOSIS — B079 Viral wart, unspecified: Secondary | ICD-10-CM | POA: Diagnosis not present

## 2020-11-23 DIAGNOSIS — L570 Actinic keratosis: Secondary | ICD-10-CM | POA: Diagnosis not present

## 2020-11-23 DIAGNOSIS — D485 Neoplasm of uncertain behavior of skin: Secondary | ICD-10-CM | POA: Diagnosis not present

## 2020-12-03 DIAGNOSIS — R7301 Impaired fasting glucose: Secondary | ICD-10-CM | POA: Diagnosis not present

## 2020-12-03 DIAGNOSIS — M859 Disorder of bone density and structure, unspecified: Secondary | ICD-10-CM | POA: Diagnosis not present

## 2020-12-03 DIAGNOSIS — Z0000 Encounter for general adult medical examination without abnormal findings: Secondary | ICD-10-CM | POA: Diagnosis not present

## 2020-12-03 DIAGNOSIS — E785 Hyperlipidemia, unspecified: Secondary | ICD-10-CM | POA: Diagnosis not present

## 2020-12-03 DIAGNOSIS — H2513 Age-related nuclear cataract, bilateral: Secondary | ICD-10-CM | POA: Diagnosis not present

## 2020-12-03 DIAGNOSIS — H00025 Hordeolum internum left lower eyelid: Secondary | ICD-10-CM | POA: Diagnosis not present

## 2020-12-03 DIAGNOSIS — H04123 Dry eye syndrome of bilateral lacrimal glands: Secondary | ICD-10-CM | POA: Diagnosis not present

## 2020-12-09 ENCOUNTER — Other Ambulatory Visit: Payer: Self-pay | Admitting: Internal Medicine

## 2020-12-09 DIAGNOSIS — Z1231 Encounter for screening mammogram for malignant neoplasm of breast: Secondary | ICD-10-CM

## 2020-12-09 DIAGNOSIS — R82998 Other abnormal findings in urine: Secondary | ICD-10-CM | POA: Diagnosis not present

## 2020-12-09 DIAGNOSIS — E785 Hyperlipidemia, unspecified: Secondary | ICD-10-CM

## 2020-12-09 DIAGNOSIS — Z0000 Encounter for general adult medical examination without abnormal findings: Secondary | ICD-10-CM | POA: Diagnosis not present

## 2020-12-09 DIAGNOSIS — Z1212 Encounter for screening for malignant neoplasm of rectum: Secondary | ICD-10-CM | POA: Diagnosis not present

## 2020-12-09 DIAGNOSIS — Z1331 Encounter for screening for depression: Secondary | ICD-10-CM | POA: Diagnosis not present

## 2020-12-14 ENCOUNTER — Ambulatory Visit: Payer: Self-pay | Admitting: Neurology

## 2020-12-16 ENCOUNTER — Telehealth: Payer: Self-pay | Admitting: Neurology

## 2020-12-16 NOTE — Telephone Encounter (Signed)
Pt. called to r/s appt. that was cancelled this week. I offered her the next available at the end of Feb. & she declined & stated that she's sure the doctor wants to see her before then. Please advise.

## 2020-12-16 NOTE — Telephone Encounter (Signed)
I do not have any sooner availability at this time for an appointment will monitor the schedule and call the pt accordingly.

## 2020-12-20 NOTE — Telephone Encounter (Signed)
I called the pt and advised we had an opening come available for tomorrow 12/21/20 at 800. Pt was agreeable to accepting appt and appt has been scheduled. I advised the pt to arrive between 730-745 for appt, she verbalized understanding.

## 2020-12-21 ENCOUNTER — Other Ambulatory Visit: Payer: Self-pay

## 2020-12-21 ENCOUNTER — Ambulatory Visit (INDEPENDENT_AMBULATORY_CARE_PROVIDER_SITE_OTHER): Payer: BC Managed Care – PPO | Admitting: Neurology

## 2020-12-21 ENCOUNTER — Encounter: Payer: Self-pay | Admitting: Neurology

## 2020-12-21 VITALS — BP 132/86 | HR 101 | Ht 62.5 in | Wt 171.0 lb

## 2020-12-21 DIAGNOSIS — R82998 Other abnormal findings in urine: Secondary | ICD-10-CM | POA: Diagnosis not present

## 2020-12-21 DIAGNOSIS — R519 Headache, unspecified: Secondary | ICD-10-CM

## 2020-12-21 DIAGNOSIS — G4733 Obstructive sleep apnea (adult) (pediatric): Secondary | ICD-10-CM | POA: Diagnosis not present

## 2020-12-21 DIAGNOSIS — R300 Dysuria: Secondary | ICD-10-CM | POA: Diagnosis not present

## 2020-12-21 MED ORDER — RIZATRIPTAN BENZOATE 5 MG PO TBDP: 5.0000 mg | ORAL_TABLET | ORAL | 2 refills | Status: DC | PRN

## 2020-12-21 NOTE — Patient Instructions (Signed)
It was nice to see you again.  Since your sleep study showed moderate to severe OSA, I would not recommend, that we leave you untreated. I will send a referral to Dentistry to Dr. Toy Cookey for consideration of an oral appliance. If you want to seek consultation with an ENT surgeon about surgical options, I can make a referral.   If you change your mind about autoPAP therapy, I can send the order to a local DME (durable medical equipment) company  For your migraine-like headaches, I recommend you try Maxalt orally disintegrating tab, 5 mg: take 1 pill early on when you suspect a migraine attack come on. You may take another pill within 2 hours, no more than 2 pills in 24 hours. Most people who take triptans do not have any serious side-effects. However, they can cause drowsiness (remember to not drive or use heavy machinery when drowsy), nausea, dizziness, dry mouth. Less common side effects include strange sensations, such as tightness in your chest or throat, tingling, flushing, and feelings of heaviness or pressure in areas such as the face, limbs, and chest. These in the chest can mimic heart related pain (angina) and may cause alarm, but usually these sensations are not harmful or a sign of a heart attack. However, if you develop intense chest pain or sensations of discomfort, you should stop taking your medication and consult with me or your PCP or go to the nearest urgent care facility or ER or call 911.   We will also try to see if we can pull of old records from when you saw Dr. Erling Cruz for your headaches.  Please follow-up in about 3 months to see one of our nurse practitioners.

## 2020-12-21 NOTE — Progress Notes (Signed)
Subjective:    Patient ID: Lisa Moody is a 75 y.o. female.  HPI     Interim history:   Lisa Moody is a 75 year old right-handed woman with an underlying medical history of diverticulitis, hyperlipidemia, history of headaches, arthritis, low back pain, knee pain, status post back surgery, status post left knee replacement, status post right rotator cuff surgery, history of recent fall with laceration to the right forehead in September 2021 and mild obesity, who Presents for follow-up consultation of her obstructive sleep apnea after interim testing. The patient is unaccompanied today. I last saw her on 08/19/2020 at the request of her primary care physician for evaluation of her recurrent headaches. I had previously evaluated her for concern for sleep apnea in 2017. She was agreeable to being tested with a sleep study as untreated sleep apnea could be a contributor for her recurrent headaches. She had a baseline sleep study on 10/06/2020 which showed a sleep efficiency of 76.1%, sleep latency of 54 minutes, REM latency of 86 minutes. Total AHI was 15/h, REM AHI 36.7/h, O2 nadir 82%. She had absence of supine sleep which probably led to some degree of underestimation of her sleep disordered breathing. She was advised to consider AutoPap therapy. She did not wish to pursue treatment with a AutoPap machine and wanted to discuss alternative options.  Today, 12/21/2020: She reports that she is claustrophobic and she cannot foresee using a CPAP machine.  She talk to Dr. Augustina Mood in dentistry and is interested in pursuing a oral appliance.  She is also working on weight loss and has joined a weight loss program some 3 weeks ago.  Her headaches are intermittent.  She had tried medication in the past with Dr. Erling Cruz.  She was treated for migraines.  Some of her headaches still have a migrainous quality and she has nausea as well as a throbbing headache.  I was able to review a previous note from an office  visit with Dr. Saverio Danker from 04/05/2012.  She was treated with Maxalt as well as frovatriptan in the past.  She does recall trying a medication starting with Karleen Hampshire and it was quite effective.  She would be willing to try it again.  In early 2013, she had evaluation for sinus issues with Dr. Constance Holster in ENT.  Previously:   08/19/20: (She) reports increase in her headaches in the past few months.  She has never been labeled with migraines.  She does have a family history of migraine headaches affecting her mother and her sister and her father also had recurrent headaches which were not labeled as migraines, probably more sinus related in his case.  She does snore.  She does not sleep well at night, she has significant sleep disruption from nocturia, 3-4 times on average.  She had a fall earlier in September and sustained a right forehead laceration which had to be sutured.  She recently had his sutures removed.  I reviewed your office note from 08/03/2020.  She was given an oral prednisone taper for 6 days at the time, starting at 30 mg daily.  She has never been on migraine medication.  She has a bedtime of 10-10 30 p.m. and rise time between 730 and 8 AM.  She has woken up with a headache.  Her weight has increased a little bit in the past year.  She has limited her caffeine to coffee, 2 cups in the mornings.  She drinks alcohol occasionally on the weekend.  She is  a non-smoker.  She had a head CT in the emergency room after the fall and it did not show any acute injuries.  Her Epworth sleepiness score is 5 out of 24, fatigue severity score 12 out of 63.   08/21/2016: 75 year old right-handed woman with an underlying medical history of history of PE in 2008, hyperlipidemia, diverticulitis, recurrent headaches, arthritis, low back pain, status post lumbar laminectomy, status post left knee replacement surgery, s/p R rotator cuff surgery in 4/16, and history of obesity, who reports snoring and excessive daytime somnolence,  as well as morning headaches and nocturia. Her Epworth sleepiness score is 10 out of 24 today, her fatigue score is 23 out of 63. I reviewed your office note from 08/02/2016, which you kindly included. Her BP at your office was elevated for her, borderline in general terms, but she recalls having had a HA that day. She will need rotator cuff surgery on the R. Had prior surgery.  Bedtime is between 10 and 10:30 PM. Wakeup time is around 7:30, on weekends or vacation around 8:30. She does like to nap, tries to limit her nap to less than 1 hour. She denies restless leg symptoms. She is not aware of any family history of OSA. She is a nonsmoker, drinks alcohol socially, mainly on weekends and drinks caffeine in the form of coffee, 2 cups per day. She has 3 grown children and 11 grandchildren. She does not drink sodas. She is trying to lose weight. She has joined [employer].  Her Past Medical History Is Significant For: Past Medical History:  Diagnosis Date  . Arthritis   . Asthma   . Back pain   . Benign neoplasm of colon   . Complication of anesthesia    anectine - caused her to have difficulty waking up and was on vent for few hours post op  . Complication of anesthesia    pulmonary embolism 2006  . Diverticulitis   . Diverticulosis of colon (without mention of hemorrhage)   . Endometriosis    reason for hysterectomy.  ovaries remain.   . Esophageal reflux    few times per month not routine  . Esophagitis, unspecified   . Headache(784.0)   . Osteopenia   . Osteopenia   . Other and unspecified hyperlipidemia   . Other and unspecified hyperlipidemia   . Other constipation   . Palpitations   . Personal history of other mental disorder   . PONV (postoperative nausea and vomiting)   . Pulmonary embolism ([place])    2006    Her Past Surgical History Is Significant For: Past Surgical History:  Procedure Laterality Date  . ABDOMINAL HYSTERECTOMY    . ABDOMINOPLASTY    . APPENDECTOMY     . BUNIONECTOMY WITH WEIL OSTEOTOMY Right 03/09/2020   Procedure: RIGHT DISTAL CHEVRON OSTEOTOMY BUNION CORRECTION;  Surgeon: Erle Crocker, MD;  Location: [place];  Service: Orthopedics;  Laterality: Right;  PROCEDURE: RIGHT DISTAL CHEVRON OSTEOTOMY BUNION CORRECTION, POSSIBLE 2ND HAMMERTOE CORRECTION  SURGERY REQUEST TIME: 2 HOURS  . CARDIAC CATHETERIZATION     09/06/10: normal coronaries and LV function (saw Dr. Rollene Fare in 2011)  . COLONOSCOPY    . JOINT REPLACEMENT     knee replacement  . LIPOSUCTION    . LUMBAR FUSION  March 2014  . LUMBAR LAMINECTOMY    . PARTIAL HYSTERECTOMY    . SHOULDER ARTHROSCOPY WITH ROTATOR CUFF REPAIR AND SUBACROMIAL DECOMPRESSION Right 02/26/2015   Procedure: RIGHT SHOULDER ARTHROSCOPY  DEBRIDEMENT WITH SUBACROMIAL DECOMPRESSION, , ROTATOR CUFF REPAIR ;  Surgeon: Elsie Saas, MD;  Location: [place];  Service: Orthopedics;  Laterality: Right;  . TOTAL KNEE ARTHROPLASTY  2010   left  . TUBAL LIGATION      Her Family History Is Significant For: Family History  Problem Relation Age of Onset  . Tics Father   . Lung cancer Father   . Breast cancer Mother   . Diabetes Mother   . Diabetes Paternal Grandmother   . Colon cancer Neg Hx   . Esophageal cancer Neg Hx   . Rectal cancer Neg Hx   . Stomach cancer Neg Hx   . Colon polyps Neg Hx     Her Social History Is Significant For: Social History   Socioeconomic History  . Marital status: Married    Spouse name: Not on file  . Number of children: 3  . Years of education: college  . Highest education level: Not on file  Occupational History  . Occupation: retired    Fish farm manager: UNEMPLOYED  Tobacco Use  . Smoking status: Never Smoker  . Smokeless tobacco: Never Used  Vaping Use  . Vaping Use: Never used  Substance and Sexual Activity  . Alcohol use: Yes    Comment: socially  . Drug use: No  . Sexual activity: Yes    Partners: Male    Birth  control/protection: Surgical    Comment: TBL/TAH  Other Topics Concern  . Not on file  Social History Narrative   Drinks 2 cup coffee a day    Social Determinants of Health   Financial Resource Strain: Not on file  Food Insecurity: Not on file  Transportation Needs: Not on file  Physical Activity: Not on file  Stress: Not on file  Social Connections: Not on file    Her Allergies Are:  Allergies  Allergen Reactions  . Anectine  [Succinylcholine Chloride] Anaphylaxis  . Succinylcholine     Respiratory suppression   . Multi Vitamin-Fluoride [Multivitamins-Fluoride] Nausea Only  :   Her Current Medications Are:  Outpatient Encounter Medications as of 12/21/2020  Medication Sig  . albuterol (VENTOLIN HFA) 108 (90 Base) MCG/ACT inhaler INHALE 2 PUFFS EVERY 4 HOURS AS NEEDED FOR WHEEZING, COUGH, AND SHORTNESS OF BREATH  . atorvastatin (LIPITOR) 10 MG tablet Take 10 mg by mouth daily.  Marland Kitchen buPROPion (WELLBUTRIN XL) 150 MG 24 hr tablet Take 1 tablet (150 mg total) by mouth daily.  . diclofenac (VOLTAREN) 75 MG EC tablet Take 75 mg by mouth. PRN  . docusate sodium (COLACE) 100 MG capsule Take 100 mg by mouth as needed.   . ezetimibe (ZETIA) 10 MG tablet Take 10 mg by mouth daily.  Marland Kitchen ibuprofen (ADVIL) 600 MG tablet Take 600 mg by mouth every 6 (six) hours as needed.  . pantoprazole (PROTONIX) 40 MG tablet TAKE 1 TABLET BY MOUTH 2 TIMES DAILY  . polyethylene glycol (MIRALAX / GLYCOLAX) packet Take 17 g by mouth daily as needed.    No facility-administered encounter medications on file as of 12/21/2020.  :  Review of Systems:  Out of a complete 14 point review of systems, all are reviewed and negative with the exception of these symptoms as listed below: Review of Systems  Neurological:       Here to discuss recent sleep study. Pt has several question about the study she had. What to see if she had any other options beside autopap.    Objective:  Neurological  Exam  Physical  Exam Physical Examination:   Vitals:   12/21/20 0809  BP: 132/86  Pulse: (!) 101  SpO2: 96%    General Examination: The patient is a very pleasant 75 y.o. female in no acute distress. She appears well-developed and well-nourished and well groomed.   HEENT:Normocephalic, corrective eyeglasses in place.  Pupils are equal, round and reactive to light. Extraocular tracking is good without limitation to gaze excursion or nystagmus noted. Normal smooth pursuit is noted. Hearing is grossly intact. Face is symmetric with normal facial animation and normal facial sensation. Speech is clear with no dysarthria noted. There is no hypophonia. There is no lip, neck/head, jaw or voice tremor. Neck is supple with full range of passive and active motion. There are no carotid bruits on auscultation. Oropharynx exam reveals: mildmouth dryness, adequatedental hygiene and moderateairway crowding. Tongue protrudes centrally and palate elevates symmetrically. Neck size is14.75inches. Shehas a Mildoverbite.  Chest:Clear to auscultation without wheezing, rhonchi or crackles noted.  Heart:S1+S2+0, regular and normal without murmurs, rubs or gallops noted.   Abdomen:Soft, non-tender and non-distended.  Extremities:There isnopitting edema in the distal lower extremities bilaterally.   Skin: Warm and dry without trophic changes noted.  Musculoskeletal: exam reveals no obvious joint deformities, tenderness or joint swelling or erythema.   Neurologically:  Mental status: The patient is awake, alert and oriented in all 4 spheres.Herimmediate and remote memory, attention, language skills and fund of knowledge are appropriate. There is no evidence of aphasia, agnosia, apraxia or anomia. Speech is clear with normal prosody and enunciation. Thought process is linear. Mood is normaland affect is normal.  Cranial nerves II - XII are as described above under HEENT exam.  Motor exam: Normal bulk,  strength and tone is noted. There is no tremor.Fine motor skills and coordination: Grossly intact.  Cerebellar testing: No dysmetria or intention tremor. There is no truncal or gait ataxia.  Sensory exam: intact to light touch in the upper and lower extremities.  Gait, station and balance:Shestands easily. No veering to one side is noted. No leaning to one side is noted. Posture is age-appropriate and stance is narrow based. Gait showsnormalstride length and pace. No trouble turning.  Assessmentand Plan:  In summary,Lisa W Vanoreis a very pleasant 60 year oldfemalewith an underlying medical history of history of PE in 2008, hyperlipidemia, diverticulitis, recurrent headaches, arthritis, low back pain, status post lumbar laminectomy, status post left knee replacement surgery, and history of obesity, history of fall in September 2021 with laceration to forehead, who presents for follow-up consultation of her recurrent headaches with prior diagnosis of migraines.  She had a recent sleep study which showed moderate to severe obstructive sleep apnea.  We talked about her sleep study results in detail and reviewed treatment options for her migraines as well.  She was also advised regarding alternative treatment options for sleep apnea.  She would like to avoid a CPAP or AutoPap machine.  She was advised regarding surgical options including inspire and we talked about the possibility of pursuing the dental device with the help of dentistry.  She is interested in seeing Dr. Toy Cookey.  She has talked to her before.  I suggested a refill to Dr. Toy Cookey for evaluation for an oral appliance.  If she changes her mind regarding trying AutoPap, we can certainly pursue this also.  For migraines, she has tried Maxalt in the past and would be agreeable to trying it again.  I suggested a 5 mg dose of Maxalt as needed, orally  disintegrating tablet.  She is advised to follow-up in this clinic in about 3 months to  see one of our nurse practitioners for headache management.  We had a detailed discussion about her sleep study results, all their questions were answered.  This was an extended visit.  If she changes her mind regarding trial of AutoPap therapy, she is welcome to call our office anytime.  I answered all her questions today and the patient and her husband were in agreement with the plan. I spent 40 minutes in total face-to-face time and in reviewing records during pre-charting, more than 50% of which was spent in counseling and coordination of care, reviewing test results, reviewing medications and treatment regimen and/or in discussing or reviewing the diagnosis of HAs, OSA, the prognosis and treatment options. Pertinent laboratory and imaging test results that were available during this visit with the patient were reviewed by me and considered in my medical decision making (see chart for details).

## 2021-01-10 NOTE — Progress Notes (Signed)
75 y.o. G3P3 Married White or Caucasian Not Hispanic or Latino female here for breast and pelvic exam.  H/O hysterectomy. Sexually active without penetration secondary to ED. Has vaginal dryness during sex.   H/O PE after surgery.   H/O depression, controlled on Wellbutrin XL 150 mg  H/O second degree cystocele and first degree rectocele. Only notices it when she washes.  As long as she takes Miralax her BM's are normal.  She has some urinary urgency, she has some urge incontinence (few times a week). She can leak small to large amounts. She is drinking a lot of water, worse in the last year. She is drinking 64-100 oz of water a day. She was just treated for a UTI. She sometimes feel like she empties her bladder other times she doesn't. Sometimes her stream is normal other times it is slow. Sometime double voids.  Not interested in a pessary or surgery.   She just had a normal visit with her primary with normal labs.     No LMP recorded. Patient is postmenopausal.          Sexually active: No.  The current method of family planning is post menopausal status.    Exercising: Yes.    walking  Smoker:  no  Health Maintenance: Pap:   2002 Neg History of abnormal Pap:  no MMG:  10-30-19 3D/Neg/density B/BiRads1, scheduled.  BMD:   08-29-16  Result :Osteopenia of hip, followed with Primary, will get one there.  Colonoscopy: 12/30/19 diverticulosis no repeat required.  TDaP:  07/28/20  Gardasil: NA   reports that she has never smoked. She has never used smokeless tobacco. She reports current alcohol use. She reports that she does not use drugs. Just occasional ETOH. 3 kids and 11 grand kids (6 are local).   Past Medical History:  Diagnosis Date  . Arthritis   . Asthma   . Back pain   . Benign neoplasm of colon   . Complication of anesthesia    anectine - caused her to have difficulty waking up and was on vent for few hours post op  . Complication of anesthesia    pulmonary embolism 2006   . Diverticulitis   . Diverticulosis of colon (without mention of hemorrhage)   . Endometriosis    reason for hysterectomy.  ovaries remain.   . Esophageal reflux    few times per month not routine  . Esophagitis, unspecified   . Headache(784.0)   . Osteopenia   . Osteopenia   . Other and unspecified hyperlipidemia   . Other and unspecified hyperlipidemia   . Other constipation   . Palpitations   . Personal history of other mental disorder   . PONV (postoperative nausea and vomiting)   . Pulmonary embolism ([place])    2006    Past Surgical History:  Procedure Laterality Date  . ABDOMINAL HYSTERECTOMY    . ABDOMINOPLASTY    . APPENDECTOMY    . BUNIONECTOMY WITH WEIL OSTEOTOMY Right 03/09/2020   Procedure: RIGHT DISTAL CHEVRON OSTEOTOMY BUNION CORRECTION;  Surgeon: Erle Crocker, MD;  Location: [facility];  Service: Orthopedics;  Laterality: Right;  PROCEDURE: RIGHT DISTAL CHEVRON OSTEOTOMY BUNION CORRECTION, POSSIBLE 2ND HAMMERTOE CORRECTION  SURGERY REQUEST TIME: 2 HOURS  . CARDIAC CATHETERIZATION     09/06/10: normal coronaries and LV function (saw Dr. Rollene Fare in 2011)  . COLONOSCOPY    . JOINT REPLACEMENT     knee replacement  . LIPOSUCTION    .  LUMBAR FUSION  March 2014  . LUMBAR LAMINECTOMY    . PARTIAL HYSTERECTOMY    . SHOULDER ARTHROSCOPY WITH ROTATOR CUFF REPAIR AND SUBACROMIAL DECOMPRESSION Right 02/26/2015   Procedure: RIGHT SHOULDER ARTHROSCOPY DEBRIDEMENT WITH SUBACROMIAL DECOMPRESSION, , ROTATOR CUFF REPAIR ;  Surgeon: Elsie Saas, MD;  Location: [facility];  Service: Orthopedics;  Laterality: Right;  . TOTAL KNEE ARTHROPLASTY  2010   left  . TUBAL LIGATION      Current Outpatient Medications  Medication Sig Dispense Refill  . albuterol (VENTOLIN HFA) 108 (90 Base) MCG/ACT inhaler INHALE 2 PUFFS EVERY 4 HOURS AS NEEDED FOR WHEEZING, COUGH, AND SHORTNESS OF BREATH    . atorvastatin (LIPITOR) 10 MG tablet Take 10 mg by  mouth daily.    Marland Kitchen buPROPion (WELLBUTRIN XL) 150 MG 24 hr tablet Take 1 tablet (150 mg total) by mouth daily. 90 tablet 3  . diclofenac (VOLTAREN) 75 MG EC tablet Take 75 mg by mouth. PRN    . docusate sodium (COLACE) 100 MG capsule Take 100 mg by mouth as needed.     . ezetimibe (ZETIA) 10 MG tablet Take 10 mg by mouth daily.    Marland Kitchen ibuprofen (ADVIL) 600 MG tablet Take 600 mg by mouth every 6 (six) hours as needed.    . pantoprazole (PROTONIX) 40 MG tablet TAKE 1 TABLET BY MOUTH 2 TIMES DAILY 60 tablet 6  . polyethylene glycol (MIRALAX / GLYCOLAX) packet Take 17 g by mouth daily as needed.     . rizatriptan (MAXALT-MLT) 5 MG disintegrating tablet Take 1 tablet (5 mg total) by mouth as needed for migraine. May repeat once in 2 hours if needed. No more than 2 pills in 24 h. 10 tablet 2   No current facility-administered medications for this visit.    Family History  Problem Relation Age of Onset  . Tics Father   . Lung cancer Father   . Breast cancer Mother   . Diabetes Mother   . Diabetes Paternal Grandmother   . Colon cancer Neg Hx   . Esophageal cancer Neg Hx   . Rectal cancer Neg Hx   . Stomach cancer Neg Hx   . Colon polyps Neg Hx     Review of Systems  All other systems reviewed and are negative.   Exam:   There were no vitals taken for this visit.  Weight change: @WEIGHTCHANGE @ Height:      Ht Readings from Last 3 Encounters:  12/21/20 5' 2.5" (1.588 m)  08/19/20 5' 2.5" (1.588 m)  03/09/20 5' 2.5" (1.588 m)    General appearance: alert, cooperative and appears stated age Head: Normocephalic, without obvious abnormality, atraumatic Neck: no adenopathy, supple, symmetrical, trachea midline and thyroid normal to inspection and palpation Breasts: normal appearance, no masses or tenderness Abdomen: soft, non-tender; non distended,  no masses,  no organomegaly Extremities: extremities normal, atraumatic, no cyanosis or edema Skin: Skin color, texture, turgor normal. No  rashes or lesions Lymph nodes: Cervical, supraclavicular, and axillary nodes normal. No abnormal inguinal nodes palpated Neurologic: Grossly normal   Pelvic: External genitalia:  no lesions              Urethra:  normal appearing urethra with no masses, tenderness or lesions              Bartholins and Skenes: normal                 Vagina: mildly atrophic vagina, moderate grade 2 cystocele  with valsalva, no significant rectocele or vaginal apex prolapse.              Cervix: absent               Bimanual Exam:  Uterus:  uterus absent              Adnexa: no mass, fullness, tenderness               Rectovaginal: Confirms               Anus:  normal sphincter tone, no lesions  Gae Dry chaperoned for the exam.  1. Encounter for gynecological examination with abnormal finding Discussed breast self exam Discussed calcium and vit D intake Getting labs with her primary  2. Overactive bladder Bladder training information given Recently treated for UTI   3. Midline cystocele Declines pessary, not bothersome enough to warrant surgery Discussed reducing her bladder to empty well  4. Vaginal dryness Discussed vaginal moisturizers and lubricant options   5. History of depression Doing well on Wellbutrin - buPROPion (WELLBUTRIN XL) 150 MG 24 hr tablet; Take 1 tablet (150 mg total) by mouth daily.  Dispense: 90 tablet; Refill: 3   In addition to the breast and pelvic exam over 20 minutes was spent in patient care in management of the above issues.

## 2021-01-11 ENCOUNTER — Ambulatory Visit: Payer: BC Managed Care – PPO | Admitting: Obstetrics and Gynecology

## 2021-01-11 ENCOUNTER — Other Ambulatory Visit: Payer: Self-pay

## 2021-01-11 ENCOUNTER — Encounter: Payer: Self-pay | Admitting: Obstetrics and Gynecology

## 2021-01-11 VITALS — BP 100/68 | HR 66 | Resp 14 | Ht 62.5 in | Wt 167.0 lb

## 2021-01-11 DIAGNOSIS — Z8659 Personal history of other mental and behavioral disorders: Secondary | ICD-10-CM

## 2021-01-11 DIAGNOSIS — N8111 Cystocele, midline: Secondary | ICD-10-CM | POA: Diagnosis not present

## 2021-01-11 DIAGNOSIS — Z01411 Encounter for gynecological examination (general) (routine) with abnormal findings: Secondary | ICD-10-CM

## 2021-01-11 DIAGNOSIS — N898 Other specified noninflammatory disorders of vagina: Secondary | ICD-10-CM

## 2021-01-11 DIAGNOSIS — N3281 Overactive bladder: Secondary | ICD-10-CM | POA: Diagnosis not present

## 2021-01-11 MED ORDER — BUPROPION HCL ER (XL) 150 MG PO TB24: 150.0000 mg | ORAL_TABLET | Freq: Every day | ORAL | 3 refills | Status: DC

## 2021-01-11 NOTE — Patient Instructions (Addendum)
You can try astroglide, uberlube or coconut oil for vaginal dryness during sex.  About Cystocele  Overview  The pelvic organs, including the bladder, are normally supported by pelvic floor muscles and ligaments.  When these muscles and ligaments are stretched, weakened or torn, the wall between the bladder and the vagina sags or herniates causing a prolapse, sometimes called a cystocele.  This condition may cause discomfort and problems with emptying the bladder.  It can be present in various stages.  Some people are not aware of the changes.  Others may notice changes at the vaginal opening or a feeling of the bladder dropping outside the body.  Causes of a Cystocele  A cystocele is usually caused by muscle straining or stretching during childbirth.  In addition, cystocele is more common after menopause, because the hormone estrogen helps keep the elastic tissues around the pelvic organs strong.  A cystocele is more likely to occur when levels of estrogen decrease.  Other causes include: heavy lifting, chronic coughing, previous pelvic surgery and obesity.  Symptoms  A bladder that has dropped from its normal position may cause: unwanted urine leakage (stress incontinence), frequent urination or urge to urinate, incomplete emptying of the bladder (not feeling bladder relief after emptying), pain or discomfort in the vagina, pelvis, groin, lower back or lower abdomen and frequent urinary tract infections.  Mild cases may not cause any symptoms.  Treatment Options  Pelvic floor (Kegel) exercises:  Strength training the muscles in your genital area  Behavioral changes: Treating and preventing constipation, taking time to empty your bladder properly, learning to lift properly and/or avoid heavy lifting when possible, stopping smoking, avoiding weight gain and treating a chronic cough or bronchitis.  A pessary: A vaginal support device is sometimes used to help pelvic support caused by muscle and  ligament changes.  Surgery: Surgical repair may be necessary if symptoms cannot be managed with exercise, behavioral changes and a pessary.  Surgery is usually considered for severe cases.   2007, Progressive TherapeuticsOveractive Bladder, Adult  Overactive bladder is a condition in which a person has a sudden and frequent need to urinate. A person might also leak urine if he or she cannot get to the bathroom fast enough (urinary incontinence). Sometimes, symptoms can interfere with work or social activities. What are the causes? Overactive bladder is associated with poor nerve signals between your bladder and your brain. Your bladder may get the signal to empty before it is full. You may also have very sensitive muscles that make your bladder squeeze too soon. This condition may also be caused by other factors, such as:  Medical conditions: ? Urinary tract infection. ? Infection of nearby tissues. ? Prostate enlargement. ? Bladder stones, inflammation, or tumors. ? Diabetes. ? Muscle or nerve weakness, especially from these conditions:  A spinal cord injury.  Stroke.  Multiple sclerosis.  Parkinson's disease.  Other causes: ? Surgery on the uterus or urethra. ? Drinking too much caffeine or alcohol. ? Certain medicines, especially those that eliminate extra fluid in the body (diuretics). ? Constipation. What increases the risk? You may be at greater risk for overactive bladder if you:  Are an older adult.  Smoke.  Are going through menopause.  Have prostate problems.  Have a neurological disease, such as stroke, dementia, Parkinson's disease, or multiple sclerosis (MS).  Eat or drink alcohol, spicy food, caffeine, and other things that irritate the bladder.  Are overweight or obese. What are the signs or symptoms? Symptoms of  this condition include a sudden, strong urge to urinate. Other symptoms include:  Leaking urine.  Urinating 8 or more times a  day.  Waking up to urinate 2 or more times overnight. How is this diagnosed? This condition may be diagnosed based on:  Your symptoms and medical history.  A physical exam.  Blood or urine tests to check for possible causes, such as infection. You may also need to see a health care provider who specializes in urinary tract problems. This is called a urologist. How is this treated? Treatment for overactive bladder depends on the cause of your condition and whether it is mild or severe. Treatment may include:  Bladder training, such as: ? Learning to control the urge to urinate by following a schedule to urinate at regular intervals. ? Doing Kegel exercises to strengthen the pelvic floor muscles that support your bladder.  Special devices, such as: ? Biofeedback. This uses sensors to help you become aware of your body's signals. ? Electrical stimulation. This uses electrodes placed inside the body (implanted) or outside the body. These electrodes send gentle pulses of electricity to strengthen the nerves or muscles that control the bladder. ? Women may use a plastic device, called a pessary, that fits into the vagina and supports the bladder.  Medicines, such as: ? Antibiotics to treat bladder infection. ? Antispasmodics to stop the bladder from releasing urine at the wrong time. ? Tricyclic antidepressants to relax bladder muscles. ? Injections of botulinum toxin type A directly into the bladder tissue to relax bladder muscles.  Surgery, such as: ? A device may be implanted to help manage the nerve signals that control urination. ? An electrode may be implanted to stimulate electrical signals in the bladder. ? A procedure may be done to change the shape of the bladder. This is done only in very severe cases. Follow these instructions at home: Eating and drinking  Make diet or lifestyle changes recommended by your health care provider. These may include: ? Drinking fluids  throughout the day and not only with meals. ? Cutting down on caffeine or alcohol. ? Eating a healthy and balanced diet to prevent constipation. This may include:  Choosing foods that are high in fiber, such as beans, whole grains, and fresh fruits and vegetables.  Limiting foods that are high in fat and processed sugars, such as fried and sweet foods.   Lifestyle  Lose weight if needed.  Do not use any products that contain nicotine or tobacco. These include cigarettes, chewing tobacco, and vaping devices, such as e-cigarettes. If you need help quitting, ask your health care provider.   General instructions  Take over-the-counter and prescription medicines only as told by your health care provider.  If you were prescribed an antibiotic medicine, take it as told by your health care provider. Do not stop taking the antibiotic even if you start to feel better.  Use any implants or pessary as told by your health care provider.  If needed, wear pads to absorb urine leakage.  Keep a log to track how much and when you drink, and when you need to urinate. This will help your health care provider monitor your condition.  Keep all follow-up visits. This is important. Contact a health care provider if:  You have a fever or chills.  Your symptoms do not get better with treatment.  Your pain and discomfort get worse.  You have more frequent urges to urinate. Get help right away if:  You are  not able to control your bladder. Summary  Overactive bladder refers to a condition in which a person has a sudden and frequent need to urinate.  Several conditions may lead to an overactive bladder.  Treatment for overactive bladder depends on the cause and severity of your condition.  Making lifestyle changes, doing Kegel exercises, keeping a log, and taking medicines can help with this condition. This information is not intended to replace advice given to you by your health care provider. Make  sure you discuss any questions you have with your health care provider. Document Revised: 07/26/2020 Document Reviewed: 07/26/2020 Elsevier Patient Education  2021 [place]   We recommended that you start or continue a regular exercise program for good health. Physical activity is anything that gets your body moving, some is better than none. The CDC recommends 150 minutes per week of Moderate-Intensity Aerobic Activity and 2 or more days of Muscle Strengthening Activity.  Benefits of exercise are limitless: helps weight loss/weight maintenance, improves mood and energy, helps with depression and anxiety, improves sleep, tones and strengthens muscles, improves balance, improves bone density, protects from chronic conditions such as heart disease, high blood pressure and diabetes and so much more. To learn more visit: WhyNotPoker.uy  DIET: Good nutrition starts with a healthy diet of fruits, vegetables, whole grains, and lean protein sources. Drink plenty of water for hydration. Minimize empty calories, sodium, sweets. For more information about dietary recommendations visit: GeekRegister.com.ee and http://schaefer-mitchell.com/  ALCOHOL:  Women should limit their alcohol intake to no more than 7 drinks/beers/glasses of wine (combined, not each!) per week. Moderation of alcohol intake to this level decreases your risk of breast cancer and liver damage.  If you are concerned that you may have a problem, or your friends have told you they are concerned about your drinking, there are many resources to help. A well-known program that is free, effective, and available to all people all over the nation is Alcoholics Anonymous.  Check out this site to learn more: BlockTaxes.se   CALCIUM AND VITAMIN D:  Adequate intake of calcium and Vitamin D are recommended for bone health.  You should be getting between  1000-1200 mg of calcium and 800 units of Vitamin D daily between diet and supplements  PAP SMEARS:  Pap smears, to check for cervical cancer or precancers,  have traditionally been done yearly, scientific advances have shown that most women can have pap smears less often.  However, every woman still should have a physical exam from her gynecologist every year. It will include a breast check, inspection of the vulva and vagina to check for abnormal growths or skin changes, a visual exam of the cervix, and then an exam to evaluate the size and shape of the uterus and ovaries. We will also provide age appropriate advice regarding health maintenance, like when you should have certain vaccines, screening for sexually transmitted diseases, bone density testing, colonoscopy, mammograms, etc.   MAMMOGRAMS:  All women over 61 years old should have a routine mammogram.   COLON CANCER SCREENING: Now recommend starting at age 57. At this time colonoscopy is not covered for routine screening until 50. There are take home tests that can be done between 45-49.   COLONOSCOPY:  Colonoscopy to screen for colon cancer is recommended for all women at age 49.  We know, you hate the idea of the prep.  We agree, BUT, having colon cancer and not knowing it is worse!!  Colon cancer so often  starts as a polyp that can be seen and removed at colonscopy, which can quite literally save your life!  And if your first colonoscopy is normal and you have no family history of colon cancer, most women don't have to have it again for 10 years.  Once every ten years, you can do something that may end up saving your life, right?  We will be happy to help you get it scheduled when you are ready.  Be sure to check your insurance coverage so you understand how much it will cost.  It may be covered as a preventative service at no cost, but you should check your particular policy.      Breast Self-Awareness Breast self-awareness means being  familiar with how your breasts look and feel. It involves checking your breasts regularly and reporting any changes to your health care provider. Practicing breast self-awareness is important. A change in your breasts can be a sign of a serious medical problem. Being familiar with how your breasts look and feel allows you to find any problems early, when treatment is more likely to be successful. All women should practice breast self-awareness, including women who have had breast implants. How to do a breast self-exam One way to learn what is normal for your breasts and whether your breasts are changing is to do a breast self-exam. To do a breast self-exam: Look for Changes  1. Remove all the clothing above your waist. 2. Stand in front of a mirror in a room with good lighting. 3. Put your hands on your hips. 4. Push your hands firmly downward. 5. Compare your breasts in the mirror. Look for differences between them (asymmetry), such as: ? Differences in shape. ? Differences in size. ? Puckers, dips, and bumps in one breast and not the other. 6. Look at each breast for changes in your skin, such as: ? Redness. ? Scaly areas. 7. Look for changes in your nipples, such as: ? Discharge. ? Bleeding. ? Dimpling. ? Redness. ? A change in position. Feel for Changes Carefully feel your breasts for lumps and changes. It is best to do this while lying on your back on the floor and again while sitting or standing in the shower or tub with soapy water on your skin. Feel each breast in the following way:  Place the arm on the side of the breast you are examining above your head.  Feel your breast with the other hand.  Start in the nipple area and make  inch (2 cm) overlapping circles to feel your breast. Use the pads of your three middle fingers to do this. Apply light pressure, then medium pressure, then firm pressure. The light pressure will allow you to feel the tissue closest to the skin. The  medium pressure will allow you to feel the tissue that is a little deeper. The firm pressure will allow you to feel the tissue close to the ribs.  Continue the overlapping circles, moving downward over the breast until you feel your ribs below your breast.  Move one finger-width toward the center of the body. Continue to use the  inch (2 cm) overlapping circles to feel your breast as you move slowly up toward your collarbone.  Continue the up and down exam using all three pressures until you reach your armpit.  Write Down What You Find  Write down what is normal for each breast and any changes that you find. Keep a written record with breast changes or  normal findings for each breast. By writing this information down, you do not need to depend only on memory for size, tenderness, or location. Write down where you are in your menstrual cycle, if you are still menstruating. If you are having trouble noticing differences in your breasts, do not get discouraged. With time you will become more familiar with the variations in your breasts and more comfortable with the exam. How often should I examine my breasts? Examine your breasts every month. If you are breastfeeding, the best time to examine your breasts is after a feeding or after using a breast pump. If you menstruate, the best time to examine your breasts is 5-7 days after your period is over. During your period, your breasts are lumpier, and it may be more difficult to notice changes. When should I see my health care provider? See your health care provider if you notice:  A change in shape or size of your breasts or nipples.  A change in the skin of your breast or nipples, such as a reddened or scaly area.  Unusual discharge from your nipples.  A lump or thick area that was not there before.  Pain in your breasts.  Anything that concerns you.

## 2021-01-12 ENCOUNTER — Ambulatory Visit: Payer: BC Managed Care – PPO | Admitting: Obstetrics and Gynecology

## 2021-01-20 IMAGING — CR DG FOOT COMPLETE 3+V*R*
3 series · 3 of 3 positions shown · non-contrast
Comparison: 07/13/2015

CLINICAL DATA: Fall

EXAM:
RIGHT FOOT COMPLETE - 3+ VIEW

[x foot ap right]
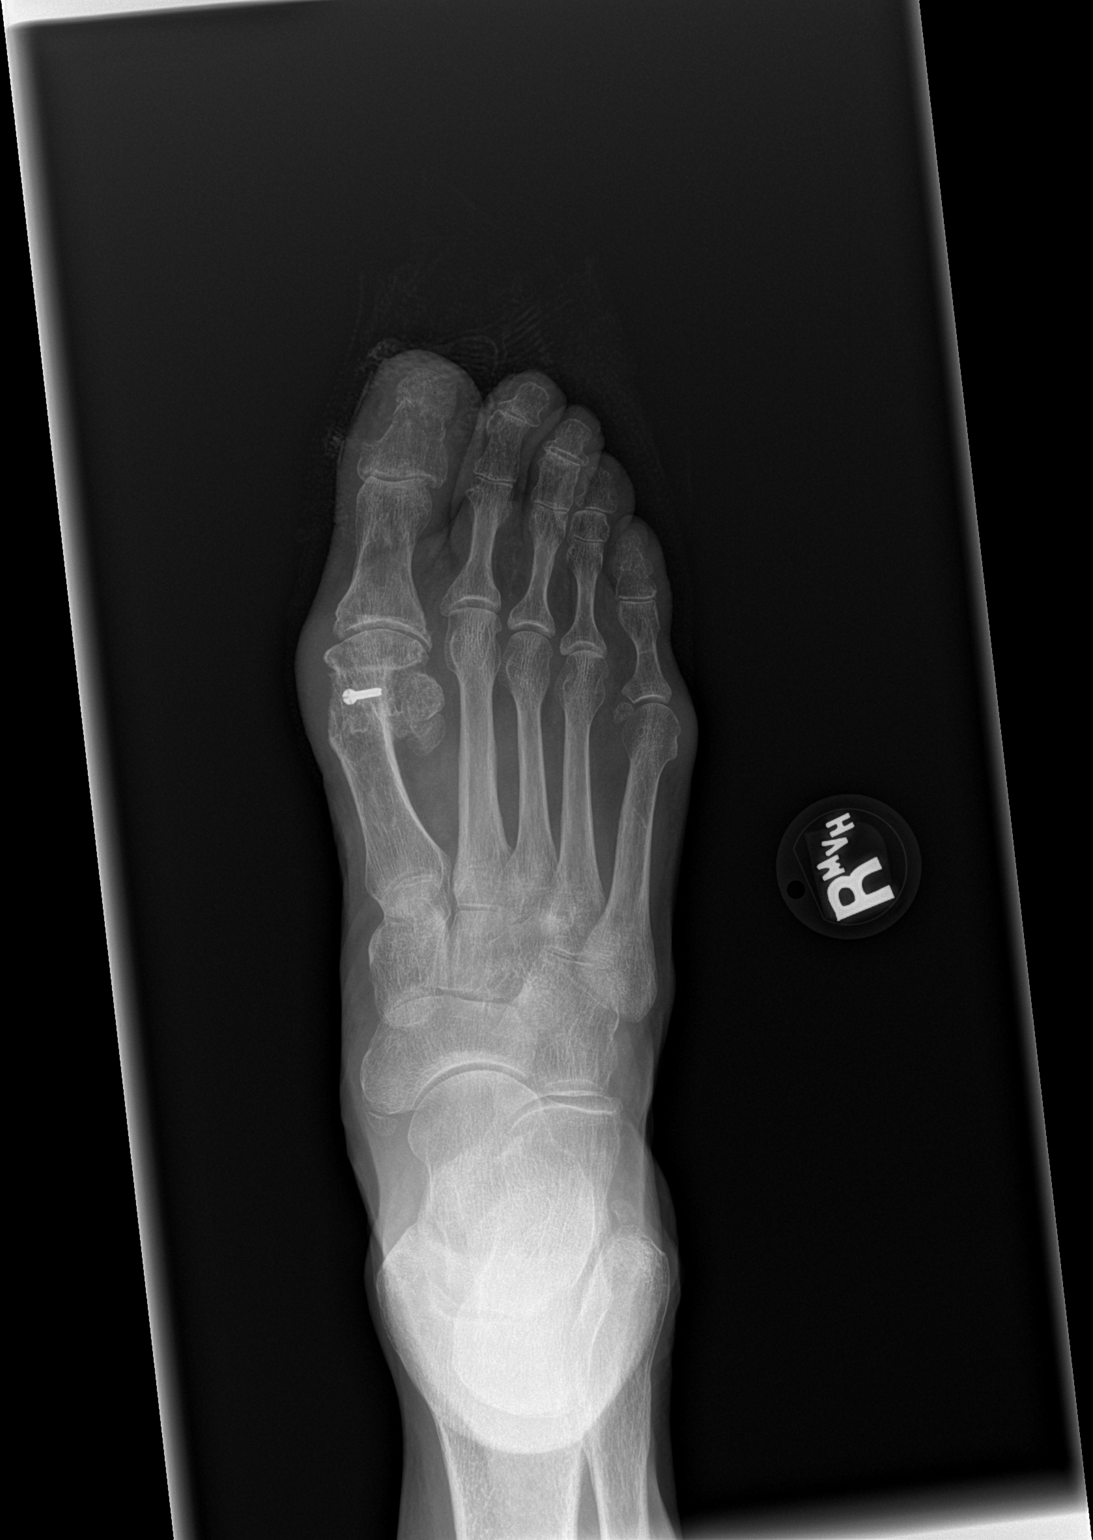

[x foot obl right]
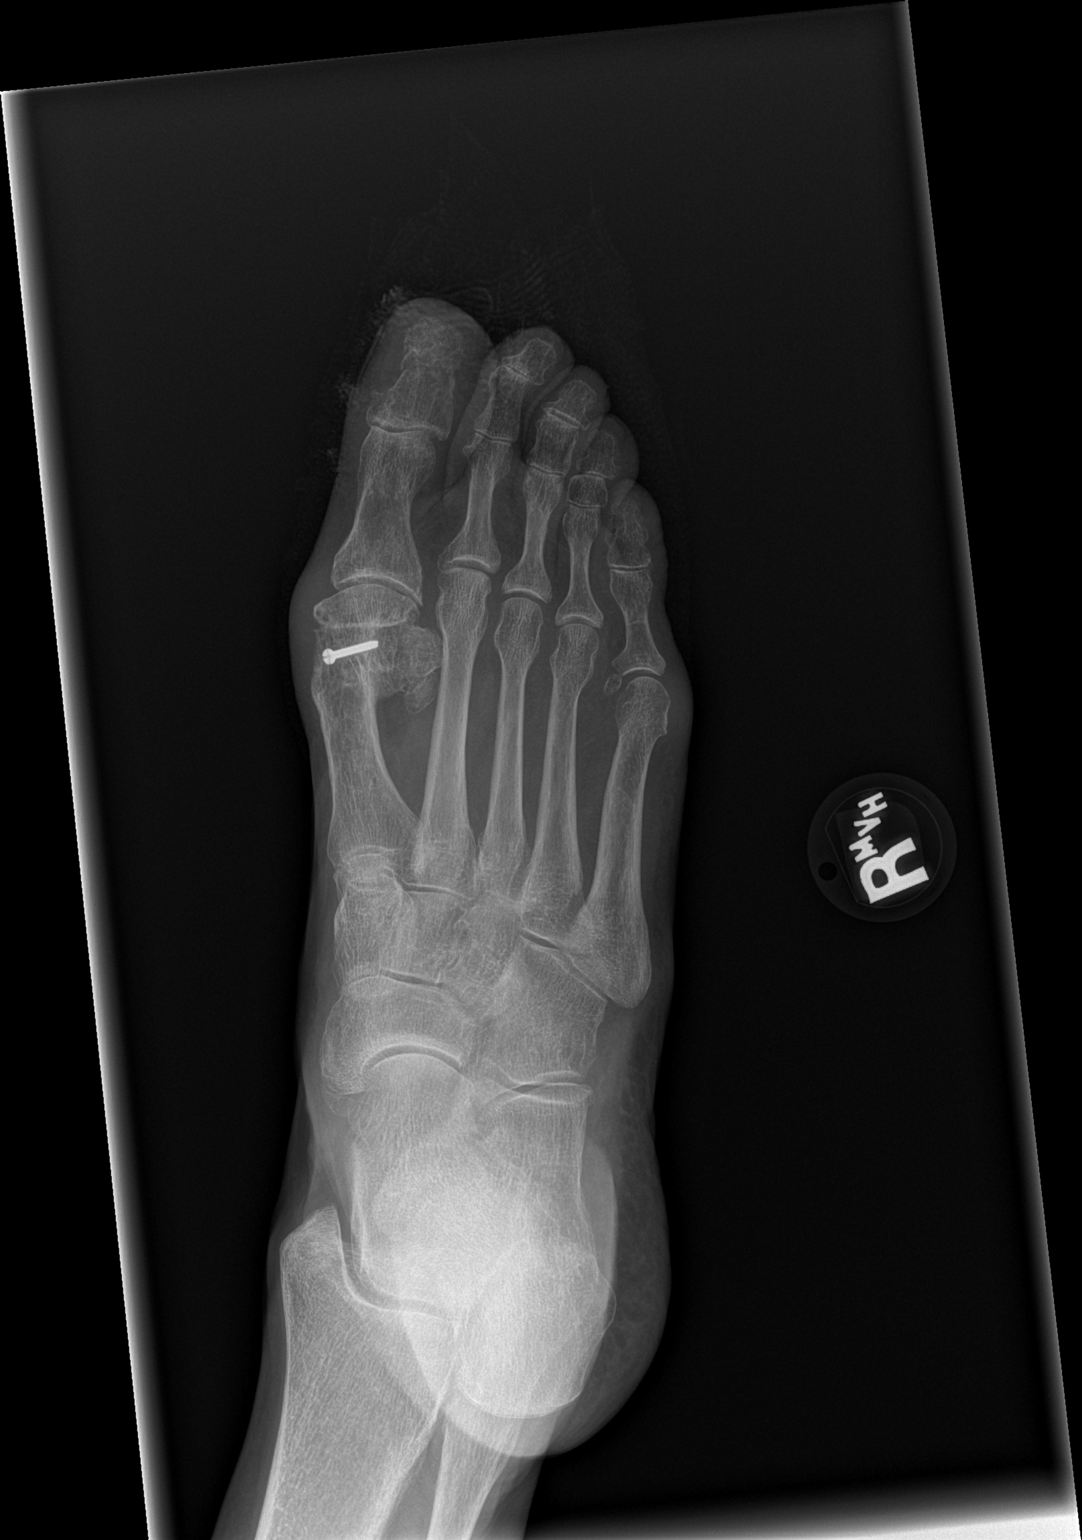

[x foot lat right]
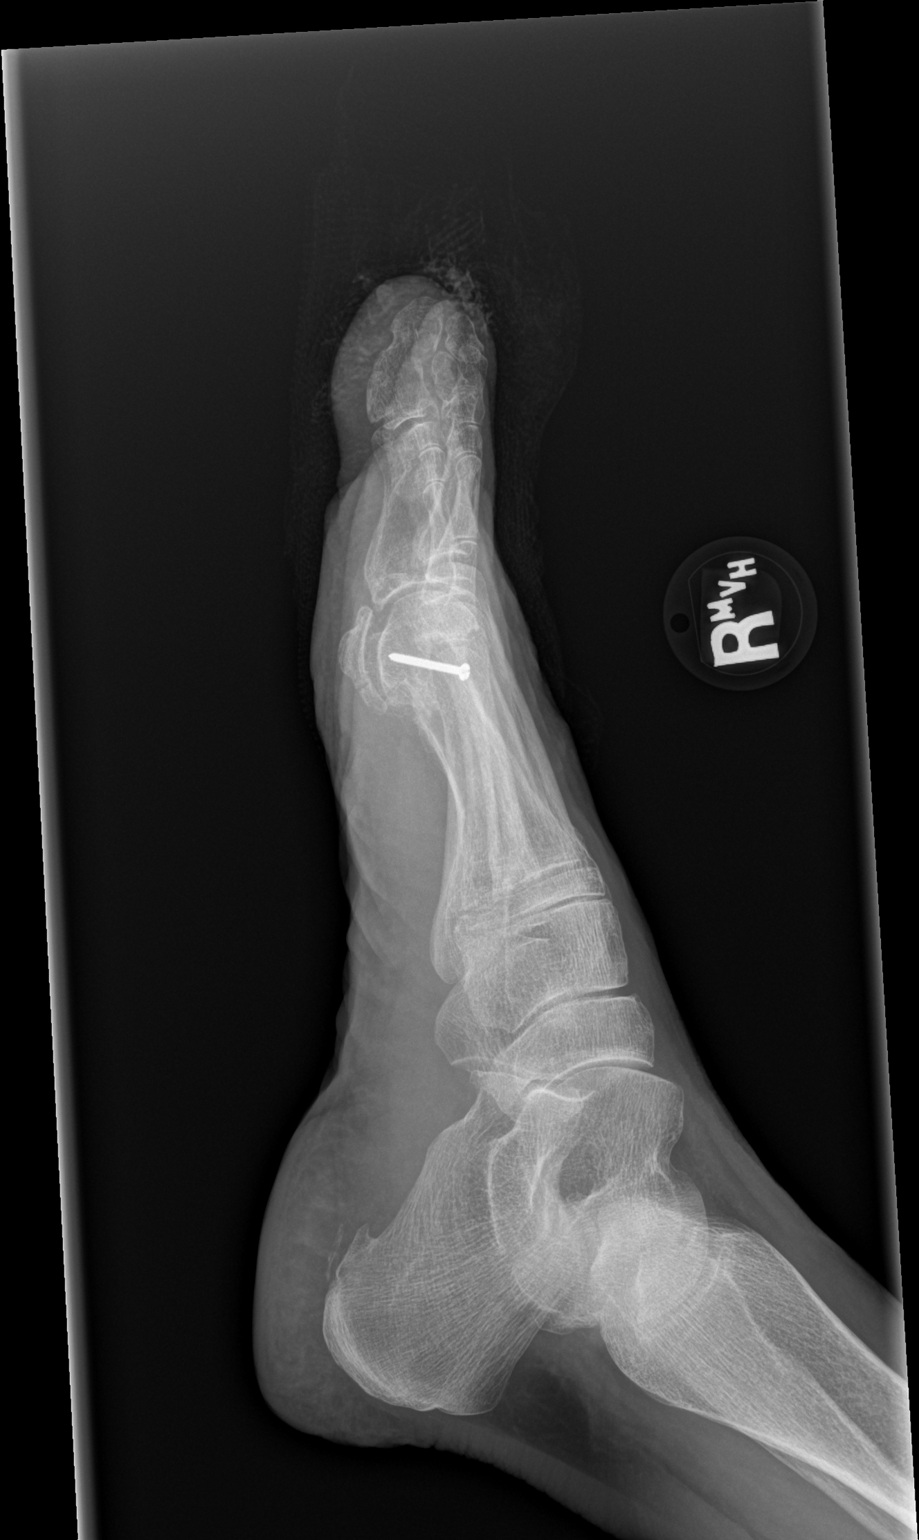

[3 of 3 positions shown; findings below may reference images not displayed]

FINDINGS: Acute nondisplaced fracture involving the distal shaft of the first
distal phalanx. No subluxation. Postsurgical deformity of the distal
first metatarsal. Small plantar calcaneal spur
IMPRESSION: Acute nondisplaced fracture involving the first distal phalanx.

## 2021-01-20 IMAGING — CR DG CHEST 2V
2 series · 2 of 2 positions shown · non-contrast
Comparison: Chest x-ray dated February 13, 2013.

CLINICAL DATA: Fall.

EXAM:
CHEST - 2 VIEW

[w chest lat]
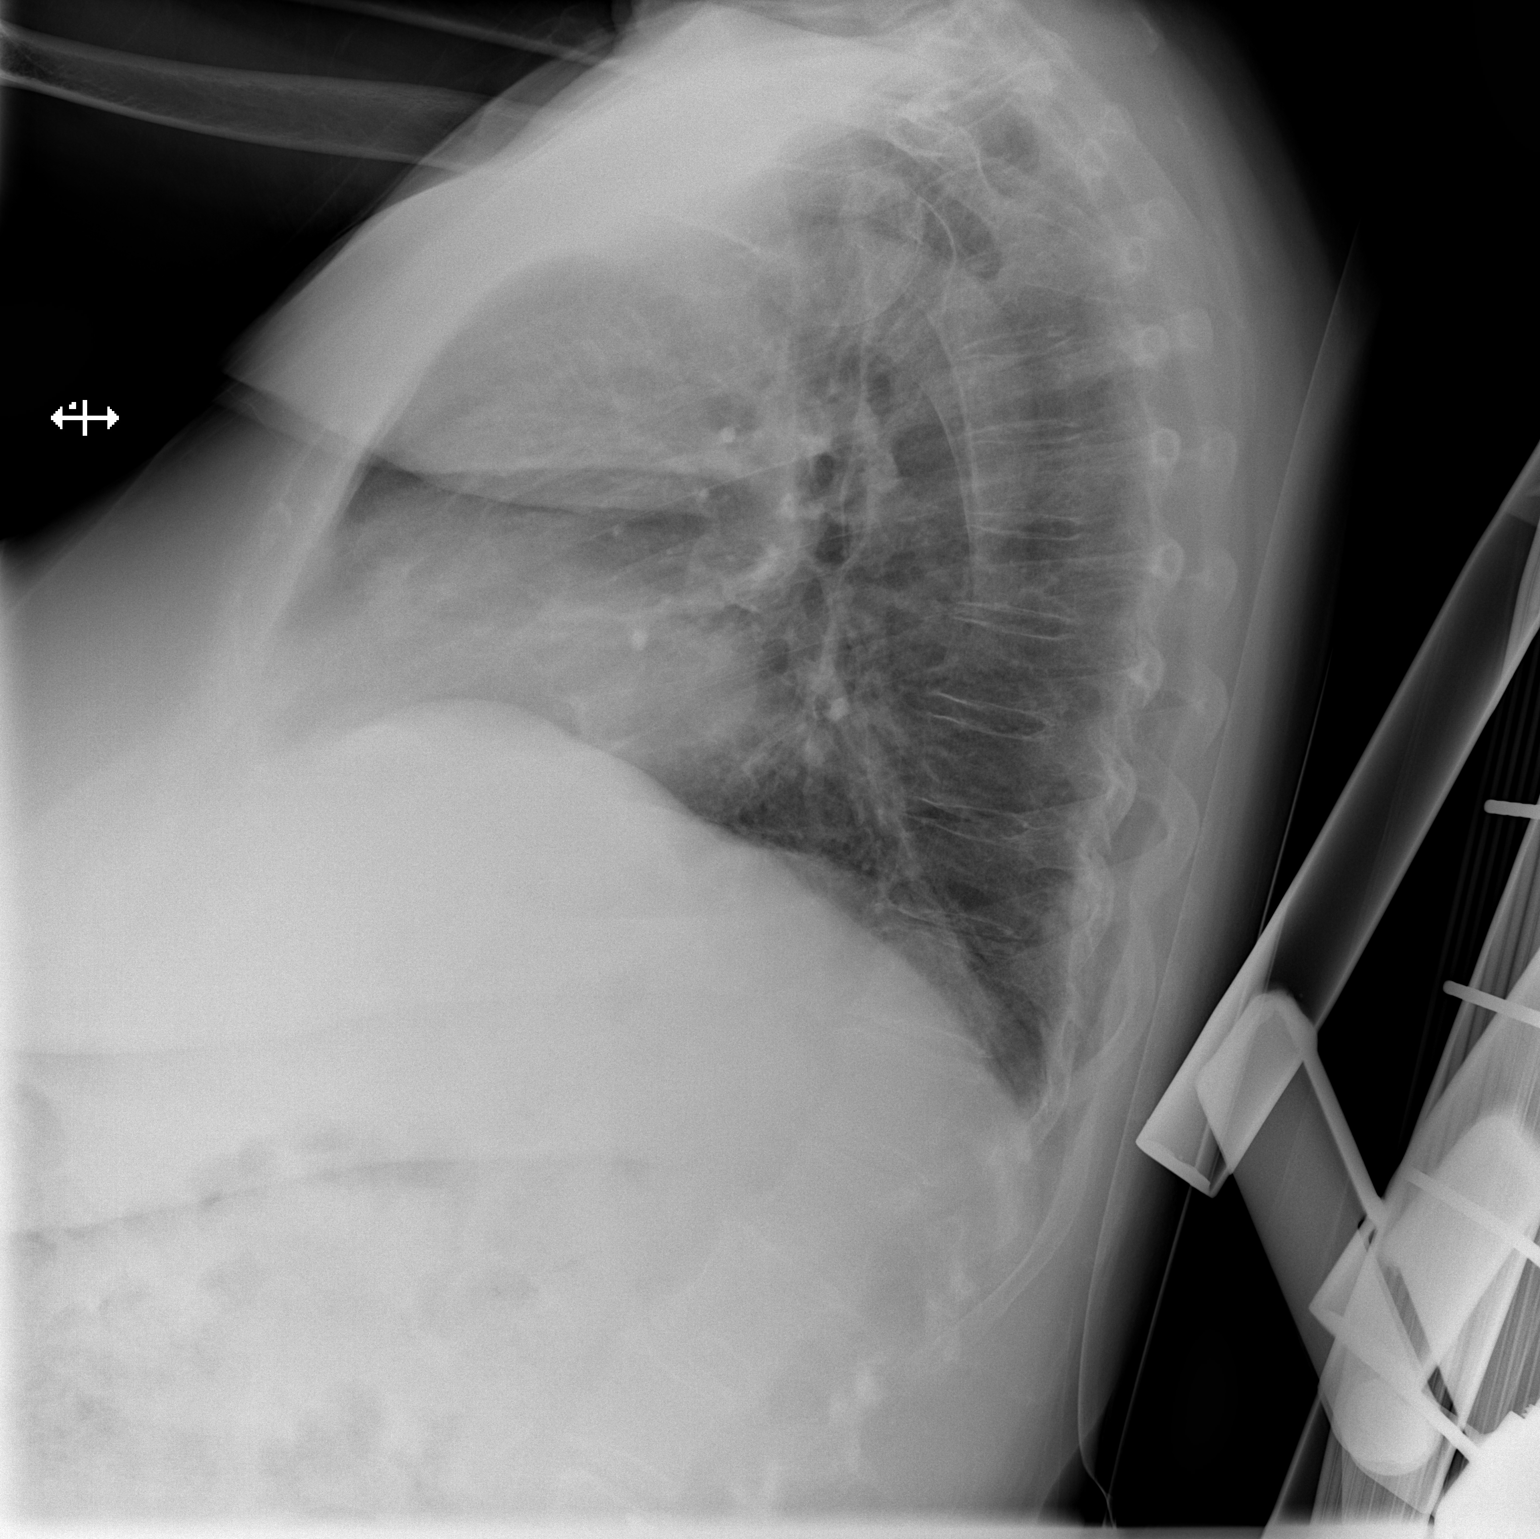

[x chest ap]
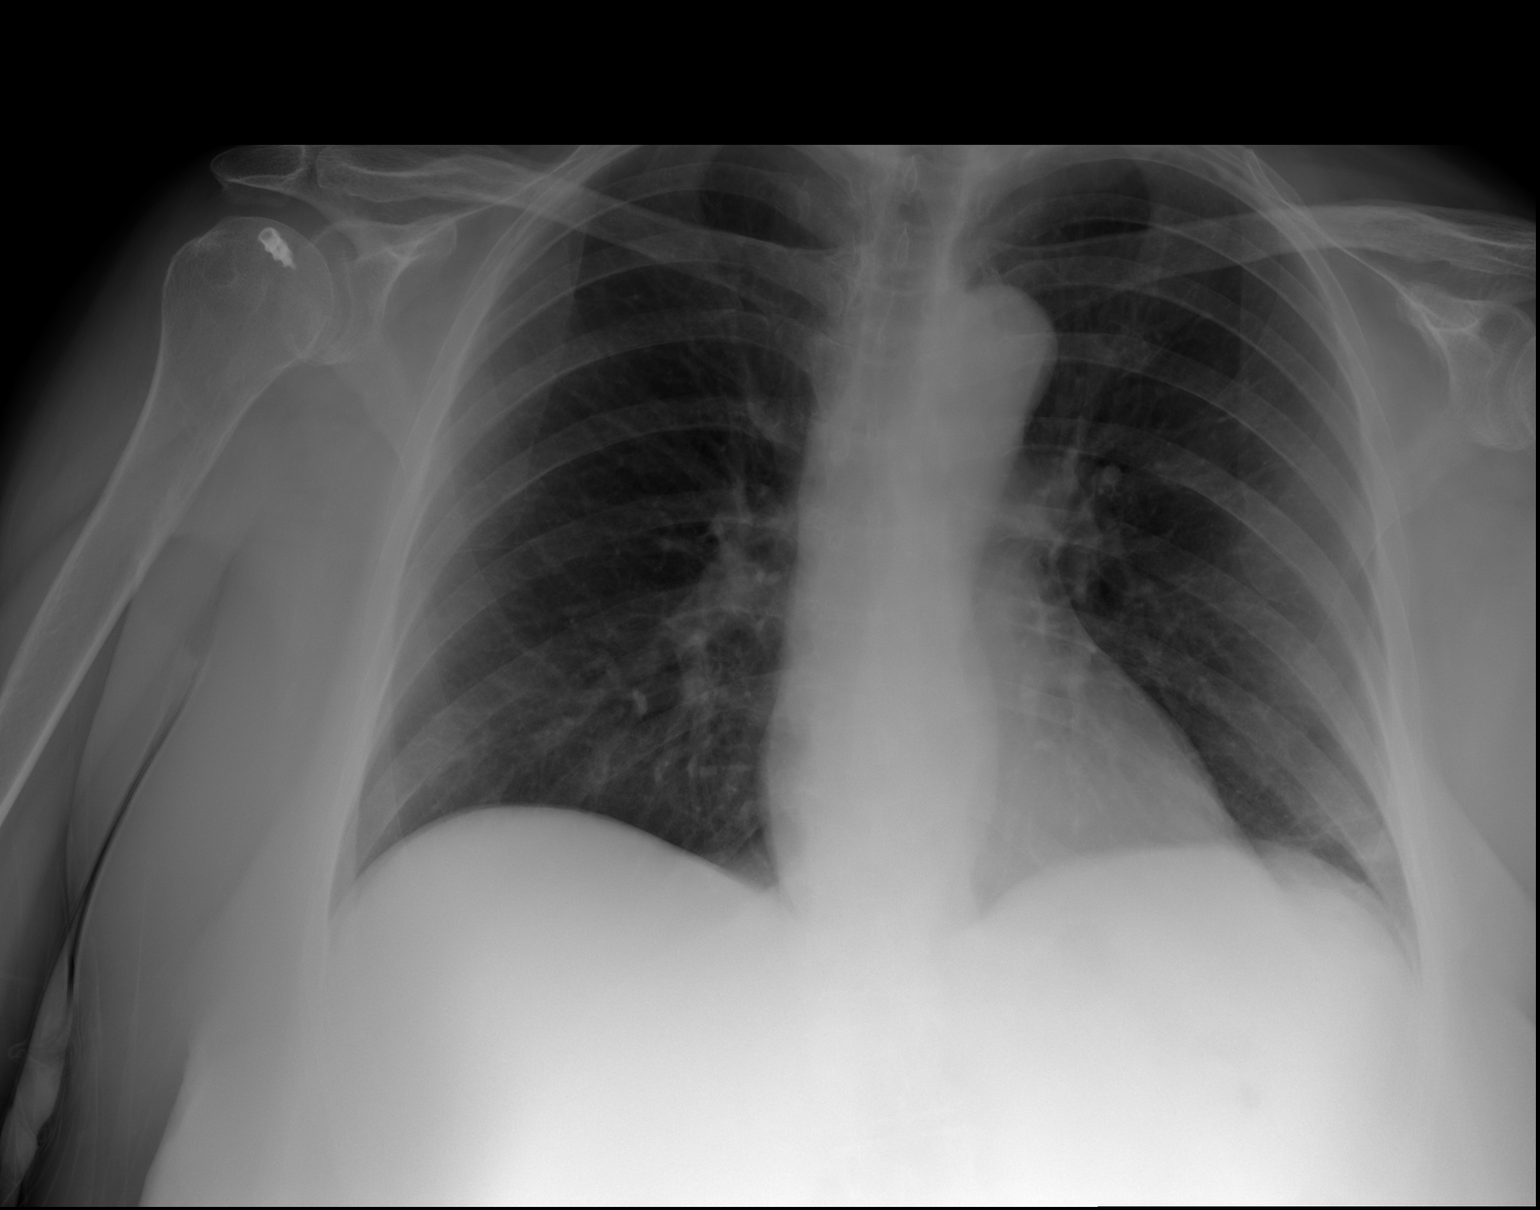

[2 of 2 positions shown; findings below may reference images not displayed]

FINDINGS: The heart size and mediastinal contours are within normal limits.
Normal pulmonary vascularity. Minimal linear scarring at the
peripheral left lung base, similar to prior study. No focal
consolidation, pleural effusion, or pneumothorax. No acute osseous
abnormality.
IMPRESSION: 1. No acute cardiopulmonary disease.

## 2021-01-20 IMAGING — CT CT HEAD W/O CM
3 series · 16 of 47 positions shown, 19 images · non-contrast
Comparison: None.

CLINICAL DATA: Hit head.  Fall.

EXAM:
CT HEAD WITHOUT CONTRAST
TECHNIQUE: Contiguous axial images were obtained from the base of the skull
through the vertex without intravenous contrast.

[Series 2: head wo · axial · 0.42mm/px · z∈[+1660,+1786]mm · 10 of 50 slices shown, 13 images]
[im 4/50  brain]
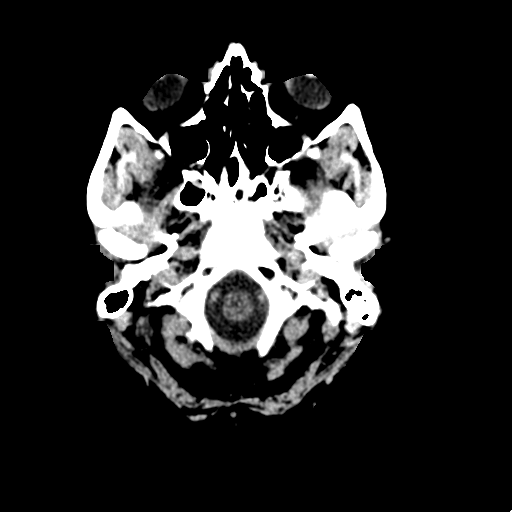
[im 4/50  bone]
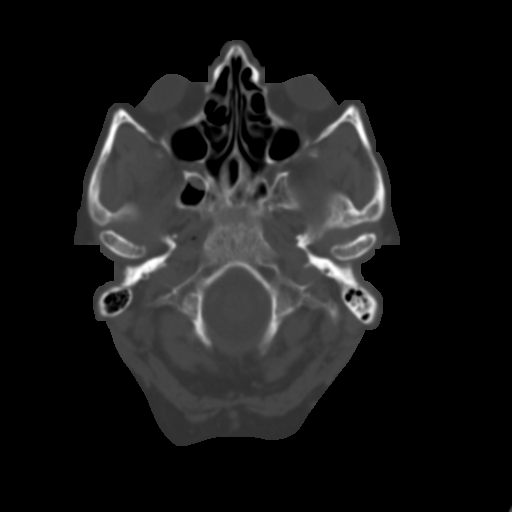
[im 9/50  brain]
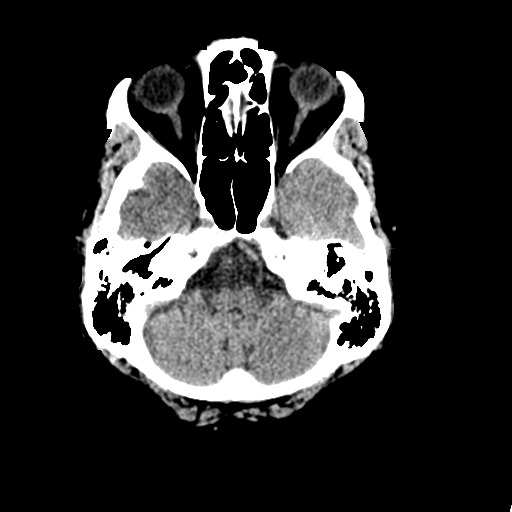
[im 14/50  brain]
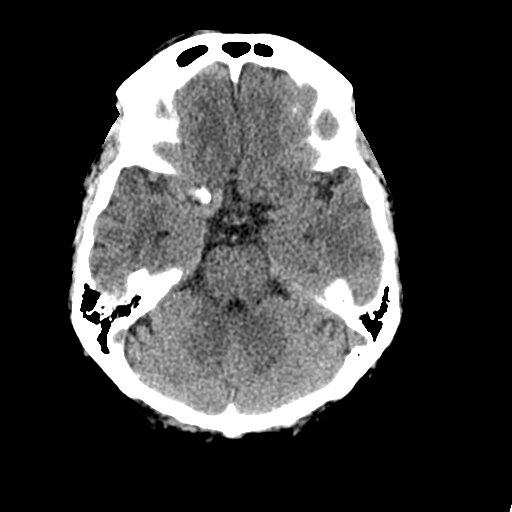
[im 17/50  brain]
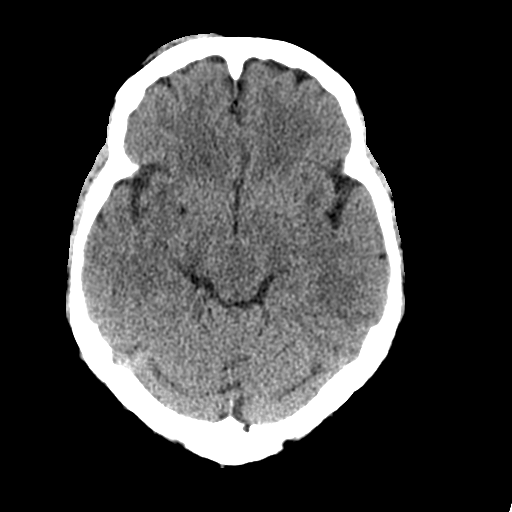
[im 22/50  brain]
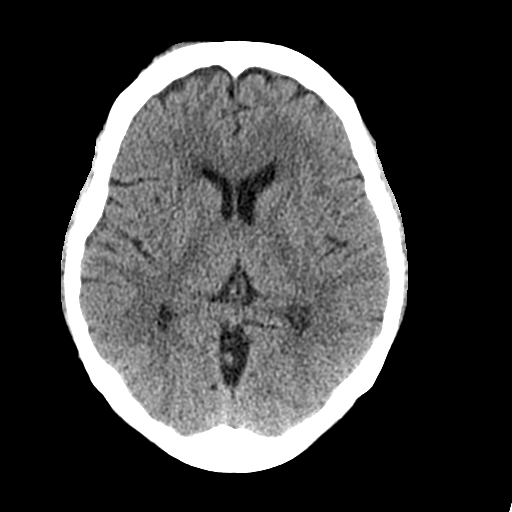
[im 22/50  bone]
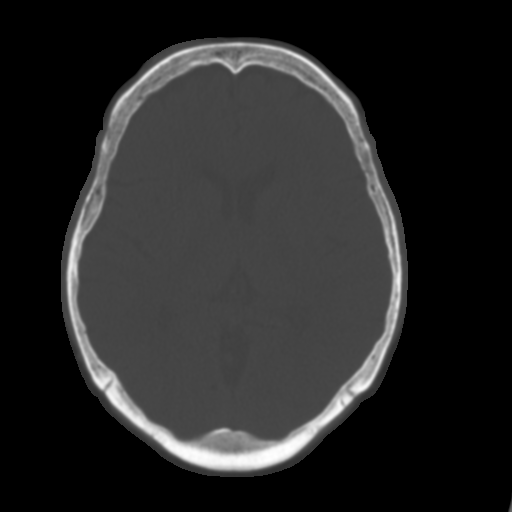
[im 28/50  brain]
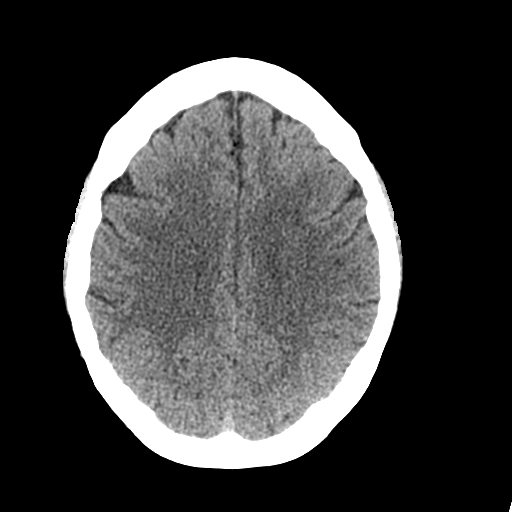
[im 33/50  brain]
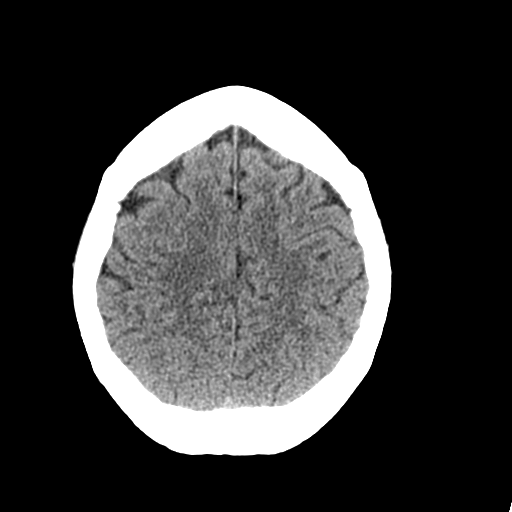
[im 38/50  brain]
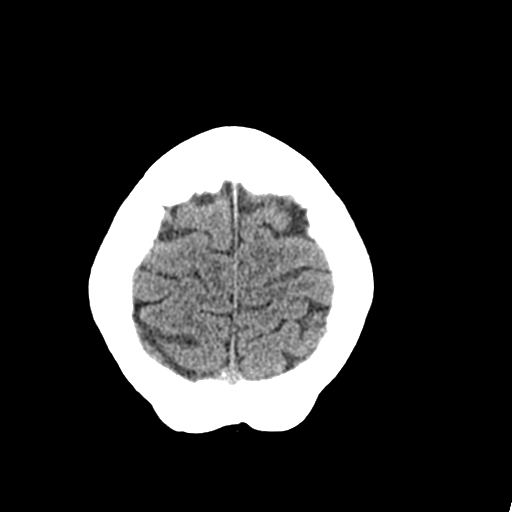
[im 41/50  brain]
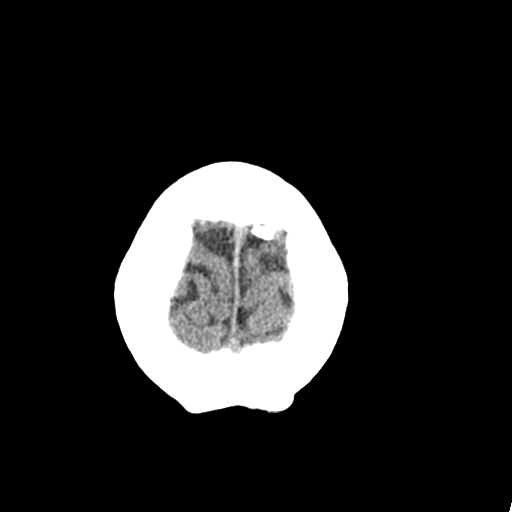
[im 41/50  bone]
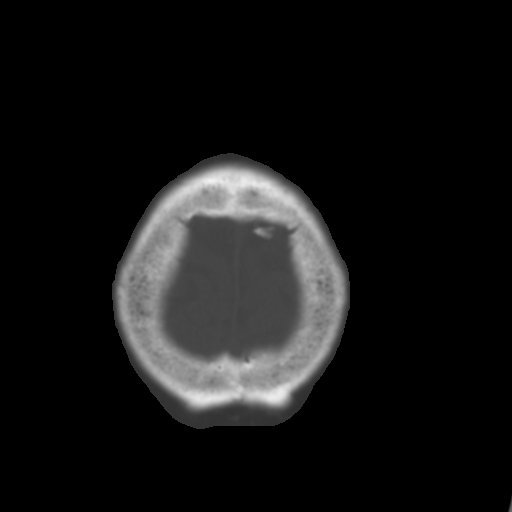
[im 46/50  brain]
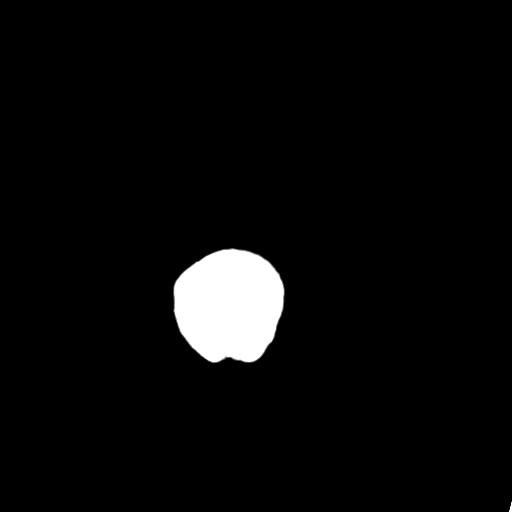

[Series 5: coronal soft tissue · coronal · 0.29mm/px · 3 of 64 slices shown]
[im 22/64  brain]
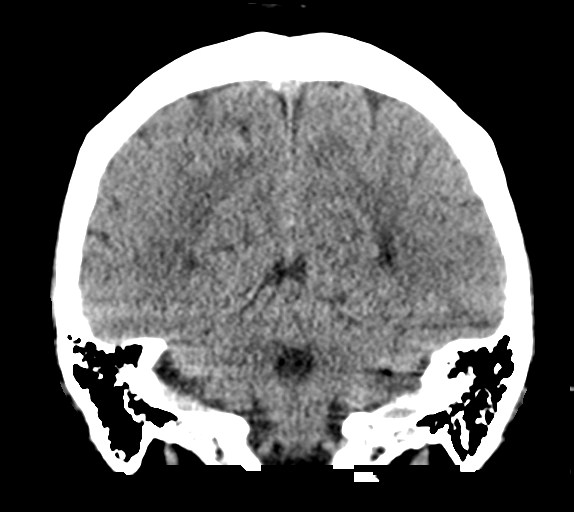
[im 29/64  brain]
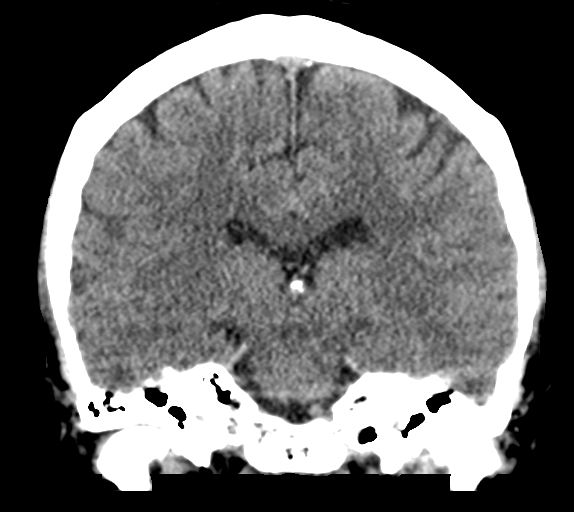
[im 36/64  brain]
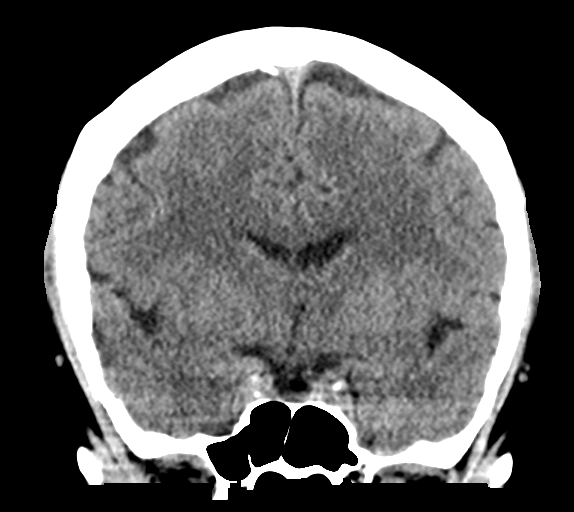

[Series 6: sagittal soft tissue · sagittal · 0.29mm/px · 3 of 56 slices shown]
[im 19/56  brain]
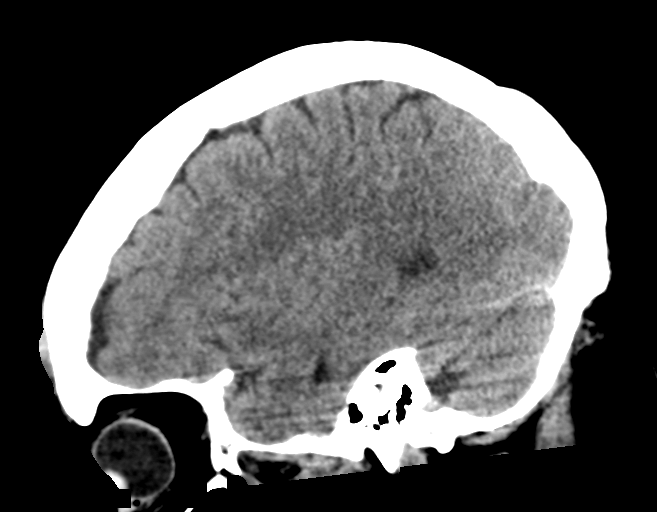
[im 28/56  brain]
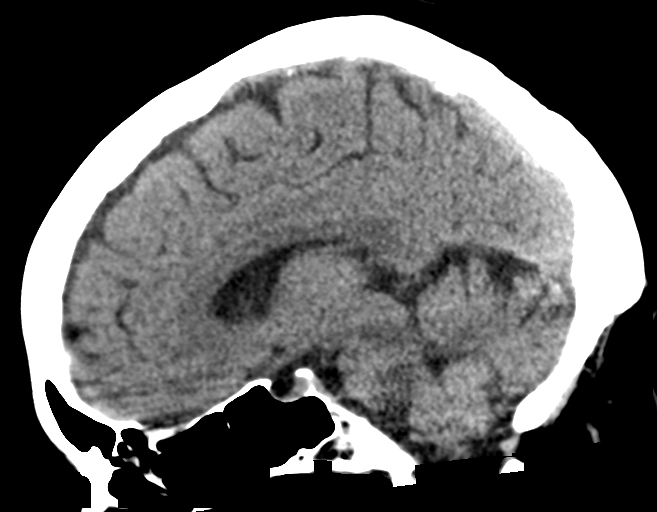
[im 37/56  brain]
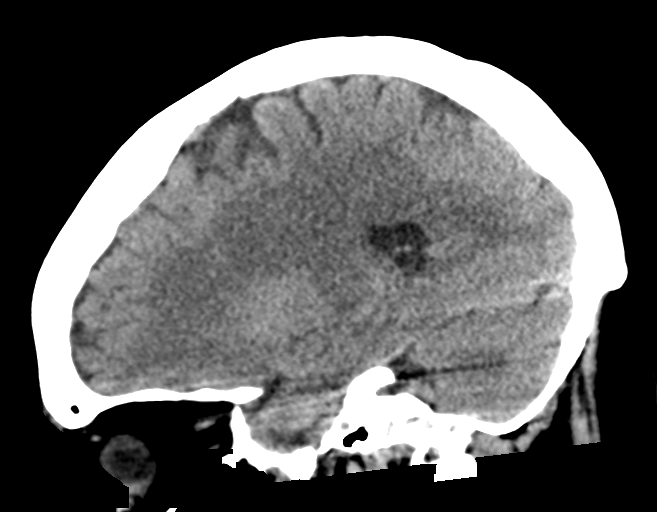

[16 of 47 positions shown; findings below may reference images not displayed]

FINDINGS: Brain: No acute intracranial abnormality. Specifically, no
hemorrhage, hydrocephalus, mass lesion, acute infarction, or
significant intracranial injury.

Vascular: No hyperdense vessel or unexpected calcification.

Skull: No acute calvarial abnormality.

Sinuses/Orbits: Visualized paranasal sinuses and mastoids clear.
Orbital soft tissues unremarkable.

Other: Soft tissue swelling in the right forehead
IMPRESSION: No acute intracranial abnormality.

## 2021-01-27 ENCOUNTER — Ambulatory Visit: Payer: BC Managed Care – PPO

## 2021-02-01 DIAGNOSIS — M5412 Radiculopathy, cervical region: Secondary | ICD-10-CM | POA: Diagnosis not present

## 2021-02-01 DIAGNOSIS — M7582 Other shoulder lesions, left shoulder: Secondary | ICD-10-CM | POA: Diagnosis not present

## 2021-03-15 ENCOUNTER — Inpatient Hospital Stay: Admission: RE | Admit: 2021-03-15 | Payer: BC Managed Care – PPO | Source: Ambulatory Visit

## 2021-03-22 ENCOUNTER — Ambulatory Visit: Payer: Medicare Other | Admitting: Adult Health

## 2021-04-11 DIAGNOSIS — Z1152 Encounter for screening for COVID-19: Secondary | ICD-10-CM | POA: Diagnosis not present

## 2021-04-11 DIAGNOSIS — J302 Other seasonal allergic rhinitis: Secondary | ICD-10-CM | POA: Diagnosis not present

## 2021-05-03 ENCOUNTER — Ambulatory Visit
Admission: RE | Admit: 2021-05-03 | Discharge: 2021-05-03 | Disposition: A | Discharge status: Home / Self Care | Payer: BC Managed Care – PPO | Source: Ambulatory Visit | Attending: Internal Medicine | Admitting: Internal Medicine

## 2021-05-03 ENCOUNTER — Other Ambulatory Visit: Payer: Self-pay

## 2021-05-03 DIAGNOSIS — Z1231 Encounter for screening mammogram for malignant neoplasm of breast: Secondary | ICD-10-CM | POA: Diagnosis not present

## 2021-05-05 DIAGNOSIS — M5116 Intervertebral disc disorders with radiculopathy, lumbar region: Secondary | ICD-10-CM | POA: Diagnosis not present

## 2021-05-06 DIAGNOSIS — M5416 Radiculopathy, lumbar region: Secondary | ICD-10-CM | POA: Diagnosis not present

## 2021-05-10 ENCOUNTER — Telehealth: Payer: Self-pay

## 2021-05-10 NOTE — Telephone Encounter (Signed)
Submited PA for Pantoprazole twice a day to Cover My Meds.

## 2021-05-17 MED ORDER — PANTOPRAZOLE SODIUM 40 MG PO TBEC: 40.0000 mg | DELAYED_RELEASE_TABLET | Freq: Two times a day (BID) | ORAL | 6 refills | Status: DC

## 2021-05-17 NOTE — Telephone Encounter (Signed)
Pantoprazole PA approved - resent rx to pharmacy with this information

## 2021-06-30 DIAGNOSIS — M8589 Other specified disorders of bone density and structure, multiple sites: Secondary | ICD-10-CM | POA: Diagnosis not present

## 2021-07-04 ENCOUNTER — Other Ambulatory Visit: Payer: Self-pay | Admitting: Internal Medicine

## 2021-07-04 DIAGNOSIS — E785 Hyperlipidemia, unspecified: Secondary | ICD-10-CM

## 2021-07-13 DIAGNOSIS — R7301 Impaired fasting glucose: Secondary | ICD-10-CM | POA: Diagnosis not present

## 2021-07-13 DIAGNOSIS — M1990 Unspecified osteoarthritis, unspecified site: Secondary | ICD-10-CM | POA: Diagnosis not present

## 2021-07-14 ENCOUNTER — Other Ambulatory Visit: Payer: Self-pay | Admitting: Internal Medicine

## 2021-07-14 DIAGNOSIS — R519 Headache, unspecified: Secondary | ICD-10-CM

## 2021-08-03 ENCOUNTER — Ambulatory Visit
Admission: RE | Admit: 2021-08-03 | Discharge: 2021-08-03 | Disposition: A | Discharge status: Home / Self Care | Payer: No Typology Code available for payment source | Source: Ambulatory Visit | Attending: Internal Medicine | Admitting: Internal Medicine

## 2021-08-03 ENCOUNTER — Ambulatory Visit
Admission: RE | Admit: 2021-08-03 | Discharge: 2021-08-03 | Disposition: A | Discharge status: Home / Self Care | Payer: BC Managed Care – PPO | Source: Ambulatory Visit | Attending: Internal Medicine | Admitting: Internal Medicine

## 2021-08-03 DIAGNOSIS — R519 Headache, unspecified: Secondary | ICD-10-CM

## 2021-08-03 DIAGNOSIS — E785 Hyperlipidemia, unspecified: Secondary | ICD-10-CM

## 2021-08-03 DIAGNOSIS — J341 Cyst and mucocele of nose and nasal sinus: Secondary | ICD-10-CM | POA: Diagnosis not present

## 2021-08-03 DIAGNOSIS — J3489 Other specified disorders of nose and nasal sinuses: Secondary | ICD-10-CM | POA: Diagnosis not present

## 2021-08-03 DIAGNOSIS — J342 Deviated nasal septum: Secondary | ICD-10-CM | POA: Diagnosis not present

## 2021-08-09 DIAGNOSIS — R030 Elevated blood-pressure reading, without diagnosis of hypertension: Secondary | ICD-10-CM | POA: Diagnosis not present

## 2021-08-10 ENCOUNTER — Other Ambulatory Visit: Payer: BC Managed Care – PPO

## 2021-08-11 DIAGNOSIS — R030 Elevated blood-pressure reading, without diagnosis of hypertension: Secondary | ICD-10-CM | POA: Diagnosis not present

## 2021-08-18 DIAGNOSIS — M5116 Intervertebral disc disorders with radiculopathy, lumbar region: Secondary | ICD-10-CM | POA: Diagnosis not present

## 2021-08-18 DIAGNOSIS — M5416 Radiculopathy, lumbar region: Secondary | ICD-10-CM | POA: Diagnosis not present

## 2021-09-07 ENCOUNTER — Other Ambulatory Visit (HOSPITAL_COMMUNITY): Payer: Self-pay | Admitting: *Deleted

## 2021-09-08 ENCOUNTER — Inpatient Hospital Stay (HOSPITAL_COMMUNITY): Admission: RE | Admit: 2021-09-08 | Payer: No Typology Code available for payment source | Source: Ambulatory Visit

## 2021-09-08 ENCOUNTER — Encounter (HOSPITAL_COMMUNITY): Payer: Self-pay

## 2021-09-13 ENCOUNTER — Other Ambulatory Visit: Payer: Self-pay | Admitting: Orthopedic Surgery

## 2021-09-13 DIAGNOSIS — M5412 Radiculopathy, cervical region: Secondary | ICD-10-CM

## 2021-09-13 DIAGNOSIS — M542 Cervicalgia: Secondary | ICD-10-CM | POA: Diagnosis not present

## 2021-09-16 DIAGNOSIS — M47812 Spondylosis without myelopathy or radiculopathy, cervical region: Secondary | ICD-10-CM | POA: Diagnosis not present

## 2021-09-21 DIAGNOSIS — M47812 Spondylosis without myelopathy or radiculopathy, cervical region: Secondary | ICD-10-CM | POA: Diagnosis not present

## 2021-09-23 ENCOUNTER — Encounter (HOSPITAL_COMMUNITY): Payer: No Typology Code available for payment source

## 2021-09-23 ENCOUNTER — Other Ambulatory Visit (HOSPITAL_COMMUNITY): Payer: Self-pay

## 2021-09-26 ENCOUNTER — Other Ambulatory Visit: Payer: Self-pay

## 2021-09-26 ENCOUNTER — Ambulatory Visit (HOSPITAL_COMMUNITY)
Admission: RE | Admit: 2021-09-26 | Discharge: 2021-09-26 | Disposition: A | Discharge status: Home / Self Care | Payer: BC Managed Care – PPO | Source: Ambulatory Visit | Attending: Internal Medicine | Admitting: Internal Medicine

## 2021-09-26 DIAGNOSIS — M810 Age-related osteoporosis without current pathological fracture: Secondary | ICD-10-CM | POA: Insufficient documentation

## 2021-09-26 MED ORDER — DENOSUMAB 60 MG/ML ~~LOC~~ SOSY
PREFILLED_SYRINGE | SUBCUTANEOUS | Status: AC
  Filled 2021-09-26: qty 1

## 2021-09-26 MED ORDER — DENOSUMAB 60 MG/ML ~~LOC~~ SOSY
60.0000 mg | PREFILLED_SYRINGE | Freq: Once | SUBCUTANEOUS | Status: AC
  Administered 2021-09-26: 60 mg via SUBCUTANEOUS

## 2021-09-27 ENCOUNTER — Ambulatory Visit
Admission: RE | Admit: 2021-09-27 | Discharge: 2021-09-27 | Disposition: A | Discharge status: Home / Self Care | Payer: No Typology Code available for payment source | Source: Ambulatory Visit | Attending: Orthopedic Surgery | Admitting: Orthopedic Surgery

## 2021-09-27 DIAGNOSIS — M5412 Radiculopathy, cervical region: Secondary | ICD-10-CM

## 2021-09-28 DIAGNOSIS — M47812 Spondylosis without myelopathy or radiculopathy, cervical region: Secondary | ICD-10-CM | POA: Diagnosis not present

## 2021-10-05 DIAGNOSIS — M47812 Spondylosis without myelopathy or radiculopathy, cervical region: Secondary | ICD-10-CM | POA: Diagnosis not present

## 2021-10-10 DIAGNOSIS — M47812 Spondylosis without myelopathy or radiculopathy, cervical region: Secondary | ICD-10-CM | POA: Diagnosis not present

## 2021-10-11 ENCOUNTER — Other Ambulatory Visit: Payer: Self-pay | Admitting: Orthopedic Surgery

## 2021-10-11 DIAGNOSIS — M7918 Myalgia, other site: Secondary | ICD-10-CM

## 2021-10-18 ENCOUNTER — Inpatient Hospital Stay: Admission: RE | Admit: 2021-10-18 | Payer: No Typology Code available for payment source | Source: Ambulatory Visit

## 2021-10-27 ENCOUNTER — Other Ambulatory Visit: Payer: No Typology Code available for payment source

## 2021-11-25 DIAGNOSIS — J45998 Other asthma: Secondary | ICD-10-CM | POA: Diagnosis not present

## 2021-11-25 DIAGNOSIS — R0981 Nasal congestion: Secondary | ICD-10-CM | POA: Diagnosis not present

## 2021-11-25 DIAGNOSIS — R051 Acute cough: Secondary | ICD-10-CM | POA: Diagnosis not present

## 2021-11-25 DIAGNOSIS — J069 Acute upper respiratory infection, unspecified: Secondary | ICD-10-CM | POA: Diagnosis not present

## 2021-12-20 DIAGNOSIS — N390 Urinary tract infection, site not specified: Secondary | ICD-10-CM | POA: Diagnosis not present

## 2021-12-20 DIAGNOSIS — R300 Dysuria: Secondary | ICD-10-CM | POA: Diagnosis not present

## 2021-12-21 DIAGNOSIS — D225 Melanocytic nevi of trunk: Secondary | ICD-10-CM | POA: Diagnosis not present

## 2021-12-21 DIAGNOSIS — L853 Xerosis cutis: Secondary | ICD-10-CM | POA: Diagnosis not present

## 2021-12-21 DIAGNOSIS — L570 Actinic keratosis: Secondary | ICD-10-CM | POA: Diagnosis not present

## 2021-12-21 DIAGNOSIS — Z85828 Personal history of other malignant neoplasm of skin: Secondary | ICD-10-CM | POA: Diagnosis not present

## 2021-12-21 DIAGNOSIS — L821 Other seborrheic keratosis: Secondary | ICD-10-CM | POA: Diagnosis not present

## 2021-12-21 DIAGNOSIS — L820 Inflamed seborrheic keratosis: Secondary | ICD-10-CM | POA: Diagnosis not present

## 2021-12-29 DIAGNOSIS — M5116 Intervertebral disc disorders with radiculopathy, lumbar region: Secondary | ICD-10-CM | POA: Diagnosis not present

## 2021-12-29 DIAGNOSIS — M5416 Radiculopathy, lumbar region: Secondary | ICD-10-CM | POA: Diagnosis not present

## 2022-01-03 ENCOUNTER — Other Ambulatory Visit (HOSPITAL_COMMUNITY): Payer: Self-pay | Admitting: Internal Medicine

## 2022-01-03 DIAGNOSIS — I10 Essential (primary) hypertension: Secondary | ICD-10-CM

## 2022-01-05 ENCOUNTER — Ambulatory Visit (HOSPITAL_COMMUNITY)
Admission: RE | Admit: 2022-01-05 | Discharge: 2022-01-05 | Disposition: A | Discharge status: Home / Self Care | Payer: BC Managed Care – PPO | Source: Ambulatory Visit | Attending: Internal Medicine | Admitting: Internal Medicine

## 2022-01-05 ENCOUNTER — Other Ambulatory Visit: Payer: Self-pay

## 2022-01-05 DIAGNOSIS — E785 Hyperlipidemia, unspecified: Secondary | ICD-10-CM | POA: Diagnosis not present

## 2022-01-05 DIAGNOSIS — N390 Urinary tract infection, site not specified: Secondary | ICD-10-CM | POA: Diagnosis not present

## 2022-01-05 DIAGNOSIS — I10 Essential (primary) hypertension: Secondary | ICD-10-CM

## 2022-01-16 DIAGNOSIS — N3000 Acute cystitis without hematuria: Secondary | ICD-10-CM | POA: Diagnosis not present

## 2022-01-16 DIAGNOSIS — R300 Dysuria: Secondary | ICD-10-CM | POA: Diagnosis not present

## 2022-01-18 DIAGNOSIS — Z0000 Encounter for general adult medical examination without abnormal findings: Secondary | ICD-10-CM | POA: Diagnosis not present

## 2022-01-18 DIAGNOSIS — I10 Essential (primary) hypertension: Secondary | ICD-10-CM | POA: Diagnosis not present

## 2022-01-18 DIAGNOSIS — Z1339 Encounter for screening examination for other mental health and behavioral disorders: Secondary | ICD-10-CM | POA: Diagnosis not present

## 2022-01-18 DIAGNOSIS — Z1331 Encounter for screening for depression: Secondary | ICD-10-CM | POA: Diagnosis not present

## 2022-01-18 DIAGNOSIS — E669 Obesity, unspecified: Secondary | ICD-10-CM | POA: Diagnosis not present

## 2022-01-20 ENCOUNTER — Other Ambulatory Visit: Payer: Self-pay | Admitting: Obstetrics and Gynecology

## 2022-01-20 DIAGNOSIS — Z8659 Personal history of other mental and behavioral disorders: Secondary | ICD-10-CM

## 2022-01-31 DIAGNOSIS — N390 Urinary tract infection, site not specified: Secondary | ICD-10-CM | POA: Diagnosis not present

## 2022-02-06 DIAGNOSIS — N3020 Other chronic cystitis without hematuria: Secondary | ICD-10-CM | POA: Diagnosis not present

## 2022-02-08 DIAGNOSIS — R7301 Impaired fasting glucose: Secondary | ICD-10-CM | POA: Diagnosis not present

## 2022-02-08 DIAGNOSIS — I10 Essential (primary) hypertension: Secondary | ICD-10-CM | POA: Diagnosis not present

## 2022-02-08 DIAGNOSIS — E785 Hyperlipidemia, unspecified: Secondary | ICD-10-CM | POA: Diagnosis not present

## 2022-03-02 DIAGNOSIS — M810 Age-related osteoporosis without current pathological fracture: Secondary | ICD-10-CM | POA: Diagnosis not present

## 2022-03-29 ENCOUNTER — Encounter (HOSPITAL_COMMUNITY): Payer: No Typology Code available for payment source

## 2022-03-29 DIAGNOSIS — M810 Age-related osteoporosis without current pathological fracture: Secondary | ICD-10-CM | POA: Diagnosis not present

## 2022-03-29 DIAGNOSIS — M5136 Other intervertebral disc degeneration, lumbar region: Secondary | ICD-10-CM | POA: Diagnosis not present

## 2022-05-15 DIAGNOSIS — L570 Actinic keratosis: Secondary | ICD-10-CM | POA: Diagnosis not present

## 2022-05-15 DIAGNOSIS — C44729 Squamous cell carcinoma of skin of left lower limb, including hip: Secondary | ICD-10-CM | POA: Diagnosis not present

## 2022-05-15 DIAGNOSIS — D485 Neoplasm of uncertain behavior of skin: Secondary | ICD-10-CM | POA: Diagnosis not present

## 2022-05-15 DIAGNOSIS — L820 Inflamed seborrheic keratosis: Secondary | ICD-10-CM | POA: Diagnosis not present

## 2022-05-17 DIAGNOSIS — H04322 Acute dacryocystitis of left lacrimal passage: Secondary | ICD-10-CM | POA: Diagnosis not present

## 2022-05-18 ENCOUNTER — Other Ambulatory Visit: Payer: Self-pay | Admitting: Internal Medicine

## 2022-06-06 DIAGNOSIS — M5416 Radiculopathy, lumbar region: Secondary | ICD-10-CM | POA: Diagnosis not present

## 2022-06-06 DIAGNOSIS — M5116 Intervertebral disc disorders with radiculopathy, lumbar region: Secondary | ICD-10-CM | POA: Diagnosis not present

## 2022-06-19 DIAGNOSIS — S82454A Nondisplaced comminuted fracture of shaft of right fibula, initial encounter for closed fracture: Secondary | ICD-10-CM | POA: Diagnosis not present

## 2022-06-28 ENCOUNTER — Other Ambulatory Visit: Payer: Self-pay | Admitting: Internal Medicine

## 2022-06-28 DIAGNOSIS — Z1231 Encounter for screening mammogram for malignant neoplasm of breast: Secondary | ICD-10-CM

## 2022-07-03 DIAGNOSIS — S82454D Nondisplaced comminuted fracture of shaft of right fibula, subsequent encounter for closed fracture with routine healing: Secondary | ICD-10-CM | POA: Diagnosis not present

## 2022-07-13 ENCOUNTER — Ambulatory Visit
Admission: RE | Admit: 2022-07-13 | Discharge: 2022-07-13 | Disposition: A | Discharge status: Home / Self Care | Payer: BC Managed Care – PPO | Source: Ambulatory Visit | Attending: Internal Medicine | Admitting: Internal Medicine

## 2022-07-13 DIAGNOSIS — Z1231 Encounter for screening mammogram for malignant neoplasm of breast: Secondary | ICD-10-CM | POA: Diagnosis not present

## 2022-07-31 DIAGNOSIS — S82454D Nondisplaced comminuted fracture of shaft of right fibula, subsequent encounter for closed fracture with routine healing: Secondary | ICD-10-CM | POA: Diagnosis not present

## 2022-07-31 DIAGNOSIS — M25561 Pain in right knee: Secondary | ICD-10-CM | POA: Diagnosis not present

## 2022-08-01 DIAGNOSIS — R911 Solitary pulmonary nodule: Secondary | ICD-10-CM | POA: Diagnosis not present

## 2022-08-01 DIAGNOSIS — R7301 Impaired fasting glucose: Secondary | ICD-10-CM | POA: Diagnosis not present

## 2022-08-01 DIAGNOSIS — I10 Essential (primary) hypertension: Secondary | ICD-10-CM | POA: Diagnosis not present

## 2022-08-02 DIAGNOSIS — R350 Frequency of micturition: Secondary | ICD-10-CM | POA: Diagnosis not present

## 2022-08-02 DIAGNOSIS — N3020 Other chronic cystitis without hematuria: Secondary | ICD-10-CM | POA: Diagnosis not present

## 2022-08-02 DIAGNOSIS — R3915 Urgency of urination: Secondary | ICD-10-CM | POA: Diagnosis not present

## 2022-08-02 DIAGNOSIS — N3000 Acute cystitis without hematuria: Secondary | ICD-10-CM | POA: Diagnosis not present

## 2022-08-03 ENCOUNTER — Other Ambulatory Visit: Payer: Self-pay | Admitting: Internal Medicine

## 2022-08-03 DIAGNOSIS — R911 Solitary pulmonary nodule: Secondary | ICD-10-CM

## 2022-08-10 DIAGNOSIS — R7301 Impaired fasting glucose: Secondary | ICD-10-CM | POA: Diagnosis not present

## 2022-08-16 DIAGNOSIS — R3915 Urgency of urination: Secondary | ICD-10-CM | POA: Diagnosis not present

## 2022-08-16 DIAGNOSIS — N3020 Other chronic cystitis without hematuria: Secondary | ICD-10-CM | POA: Diagnosis not present

## 2022-08-16 DIAGNOSIS — N952 Postmenopausal atrophic vaginitis: Secondary | ICD-10-CM | POA: Diagnosis not present

## 2022-08-21 DIAGNOSIS — M810 Age-related osteoporosis without current pathological fracture: Secondary | ICD-10-CM | POA: Diagnosis not present

## 2022-08-28 DIAGNOSIS — M25571 Pain in right ankle and joints of right foot: Secondary | ICD-10-CM | POA: Diagnosis not present

## 2022-08-28 DIAGNOSIS — M542 Cervicalgia: Secondary | ICD-10-CM | POA: Diagnosis not present

## 2022-08-28 DIAGNOSIS — S46012A Strain of muscle(s) and tendon(s) of the rotator cuff of left shoulder, initial encounter: Secondary | ICD-10-CM | POA: Diagnosis not present

## 2022-08-29 ENCOUNTER — Other Ambulatory Visit: Payer: No Typology Code available for payment source

## 2022-08-29 DIAGNOSIS — M5412 Radiculopathy, cervical region: Secondary | ICD-10-CM | POA: Diagnosis not present

## 2022-08-29 DIAGNOSIS — Z6830 Body mass index (BMI) 30.0-30.9, adult: Secondary | ICD-10-CM | POA: Diagnosis not present

## 2022-09-05 DIAGNOSIS — M48062 Spinal stenosis, lumbar region with neurogenic claudication: Secondary | ICD-10-CM | POA: Diagnosis not present

## 2022-09-07 DIAGNOSIS — M5416 Radiculopathy, lumbar region: Secondary | ICD-10-CM | POA: Diagnosis not present

## 2022-09-07 DIAGNOSIS — M5116 Intervertebral disc disorders with radiculopathy, lumbar region: Secondary | ICD-10-CM | POA: Diagnosis not present

## 2022-09-11 ENCOUNTER — Other Ambulatory Visit: Payer: Self-pay | Admitting: Internal Medicine

## 2022-09-11 ENCOUNTER — Other Ambulatory Visit: Payer: Self-pay | Admitting: Neurological Surgery

## 2022-09-11 DIAGNOSIS — M48062 Spinal stenosis, lumbar region with neurogenic claudication: Secondary | ICD-10-CM

## 2022-09-12 ENCOUNTER — Inpatient Hospital Stay: Admission: RE | Admit: 2022-09-12 | Payer: No Typology Code available for payment source | Source: Ambulatory Visit

## 2022-09-16 ENCOUNTER — Ambulatory Visit
Admission: RE | Admit: 2022-09-16 | Discharge: 2022-09-16 | Disposition: A | Discharge status: Home / Self Care | Payer: Self-pay | Source: Ambulatory Visit | Attending: Neurological Surgery | Admitting: Neurological Surgery

## 2022-09-16 DIAGNOSIS — M47817 Spondylosis without myelopathy or radiculopathy, lumbosacral region: Secondary | ICD-10-CM | POA: Diagnosis not present

## 2022-09-16 DIAGNOSIS — M48061 Spinal stenosis, lumbar region without neurogenic claudication: Secondary | ICD-10-CM | POA: Diagnosis not present

## 2022-09-16 DIAGNOSIS — M48062 Spinal stenosis, lumbar region with neurogenic claudication: Secondary | ICD-10-CM

## 2022-09-16 DIAGNOSIS — M5127 Other intervertebral disc displacement, lumbosacral region: Secondary | ICD-10-CM | POA: Diagnosis not present

## 2022-09-16 MED ORDER — GADOPICLENOL 0.5 MMOL/ML IV SOLN
7.5000 mL | Freq: Once | INTRAVENOUS | Status: AC | PRN
  Administered 2022-09-16: 7.5 mL via INTRAVENOUS

## 2022-09-19 DIAGNOSIS — M4310 Spondylolisthesis, site unspecified: Secondary | ICD-10-CM | POA: Diagnosis not present

## 2022-09-19 DIAGNOSIS — M810 Age-related osteoporosis without current pathological fracture: Secondary | ICD-10-CM | POA: Diagnosis not present

## 2022-09-19 DIAGNOSIS — M48062 Spinal stenosis, lumbar region with neurogenic claudication: Secondary | ICD-10-CM | POA: Diagnosis not present

## 2022-09-19 DIAGNOSIS — M5416 Radiculopathy, lumbar region: Secondary | ICD-10-CM | POA: Diagnosis not present

## 2022-09-20 ENCOUNTER — Other Ambulatory Visit: Payer: Self-pay | Admitting: Neurological Surgery

## 2022-09-22 ENCOUNTER — Other Ambulatory Visit: Payer: Self-pay

## 2022-09-22 ENCOUNTER — Encounter (HOSPITAL_COMMUNITY): Payer: Self-pay | Admitting: Neurological Surgery

## 2022-09-22 NOTE — Progress Notes (Signed)
Pt made aware of surgery time change [phone], arrival 0530, and to stop drinking clear liquids by 0430.

## 2022-09-22 NOTE — Progress Notes (Signed)
Spoke with pt for pre-op call. Pt denies cardiac history or Diabetes. Pt is treated for HTN. Pt has hx of a PE after having a tummy tuck, she states that it was due to the liposuction. States she has never had another one.   Pt's husband will come today and pick up the CHG soap and instructions on how to use for patient.

## 2022-09-25 ENCOUNTER — Observation Stay (HOSPITAL_COMMUNITY)
Admission: RE | Admit: 2022-09-25 | Discharge: 2022-09-26 | Disposition: A | Discharge status: Home / Self Care | Payer: 59 | Source: Ambulatory Visit | Attending: Neurological Surgery | Admitting: Neurological Surgery

## 2022-09-25 ENCOUNTER — Other Ambulatory Visit: Payer: Self-pay

## 2022-09-25 ENCOUNTER — Ambulatory Visit (HOSPITAL_COMMUNITY): Payer: 59 | Admitting: Anesthesiology

## 2022-09-25 ENCOUNTER — Ambulatory Visit (HOSPITAL_BASED_OUTPATIENT_CLINIC_OR_DEPARTMENT_OTHER): Payer: 59 | Admitting: Anesthesiology

## 2022-09-25 ENCOUNTER — Encounter (HOSPITAL_COMMUNITY)
Admission: RE | Disposition: A | Discharge status: Home / Self Care | Payer: Self-pay | Source: Ambulatory Visit | Attending: Neurological Surgery

## 2022-09-25 ENCOUNTER — Ambulatory Visit (HOSPITAL_COMMUNITY): Payer: 59

## 2022-09-25 ENCOUNTER — Encounter (HOSPITAL_COMMUNITY): Payer: Self-pay | Admitting: Neurological Surgery

## 2022-09-25 DIAGNOSIS — G4730 Sleep apnea, unspecified: Secondary | ICD-10-CM

## 2022-09-25 DIAGNOSIS — M4726 Other spondylosis with radiculopathy, lumbar region: Secondary | ICD-10-CM | POA: Diagnosis not present

## 2022-09-25 DIAGNOSIS — Z96652 Presence of left artificial knee joint: Secondary | ICD-10-CM | POA: Insufficient documentation

## 2022-09-25 DIAGNOSIS — M5416 Radiculopathy, lumbar region: Secondary | ICD-10-CM

## 2022-09-25 DIAGNOSIS — J45909 Unspecified asthma, uncomplicated: Secondary | ICD-10-CM | POA: Diagnosis not present

## 2022-09-25 DIAGNOSIS — M7138 Other bursal cyst, other site: Secondary | ICD-10-CM | POA: Diagnosis not present

## 2022-09-25 DIAGNOSIS — I10 Essential (primary) hypertension: Secondary | ICD-10-CM | POA: Diagnosis not present

## 2022-09-25 DIAGNOSIS — Z7722 Contact with and (suspected) exposure to environmental tobacco smoke (acute) (chronic): Secondary | ICD-10-CM | POA: Diagnosis not present

## 2022-09-25 DIAGNOSIS — M48061 Spinal stenosis, lumbar region without neurogenic claudication: Principal | ICD-10-CM | POA: Insufficient documentation

## 2022-09-25 DIAGNOSIS — Z86711 Personal history of pulmonary embolism: Secondary | ICD-10-CM | POA: Insufficient documentation

## 2022-09-25 DIAGNOSIS — Z79899 Other long term (current) drug therapy: Secondary | ICD-10-CM | POA: Diagnosis not present

## 2022-09-25 DIAGNOSIS — Z8616 Personal history of COVID-19: Secondary | ICD-10-CM | POA: Diagnosis not present

## 2022-09-25 HISTORY — DX: Sleep apnea, unspecified: G47.30

## 2022-09-25 HISTORY — DX: Depression, unspecified: F32.A

## 2022-09-25 HISTORY — DX: Peripheral vascular disease, unspecified: I73.9

## 2022-09-25 HISTORY — DX: Essential (primary) hypertension: I10

## 2022-09-25 HISTORY — PX: LUMBAR LAMINECTOMY/DECOMPRESSION MICRODISCECTOMY: SHX5026

## 2022-09-25 LAB — BASIC METABOLIC PANEL
Anion gap: 10 (ref 5–15)
BUN: 25 mg/dL — ABNORMAL HIGH (ref 8–23)
CO2: 22 mmol/L (ref 22–32)
Calcium: 9.5 mg/dL (ref 8.9–10.3)
Chloride: 108 mmol/L (ref 98–111)
Creatinine, Ser: 0.65 mg/dL (ref 0.44–1.00)
GFR, Estimated: >60 mL/min (ref >60)
Glucose, Bld: 115 mg/dL — ABNORMAL HIGH (ref 70–99)
Potassium: 3.9 mmol/L (ref 3.5–5.1)
Sodium: 140 mmol/L (ref 135–145)

## 2022-09-25 LAB — CBC
HCT: 39.8 % (ref 36.0–46.0)
Hemoglobin: 13.3 g/dL (ref 12.0–15.0)
MCH: 31.1 pg (ref 26.0–34.0)
MCHC: 33.4 g/dL (ref 30.0–36.0)
MCV: 93.2 fL (ref 80.0–100.0)
Platelets: 175 10*3/uL (ref 150–400)
RBC: 4.27 MIL/uL (ref 3.87–5.11)
RDW: 14 % (ref 11.5–15.5)
WBC: 9.3 10*3/uL (ref 4.0–10.5)
nRBC: 0 % (ref 0.0–0.2)

## 2022-09-25 LAB — TYPE AND SCREEN
ABO/RH(D): O POS
Antibody Screen: NEGATIVE

## 2022-09-25 LAB — SURGICAL PCR SCREEN
MRSA, PCR: NEGATIVE
Staphylococcus aureus: NEGATIVE

## 2022-09-25 SURGERY — LUMBAR LAMINECTOMY/DECOMPRESSION MICRODISCECTOMY 2 LEVELS
Anesthesia: General | Site: Back | Laterality: Bilateral

## 2022-09-25 MED ORDER — SENNA 8.6 MG PO TABS
1.0000 | ORAL_TABLET | Freq: Two times a day (BID) | ORAL | Status: DC
  Administered 2022-09-26: 8.6 mg via ORAL
  Filled 2022-09-25 (×3): qty 1

## 2022-09-25 MED ORDER — METHOCARBAMOL 500 MG PO TABS
500.0000 mg | ORAL_TABLET | Freq: Four times a day (QID) | ORAL | Status: DC | PRN
  Administered 2022-09-25 – 2022-09-26 (×2): 500 mg via ORAL
  Filled 2022-09-25 (×2): qty 1

## 2022-09-25 MED ORDER — CHLORHEXIDINE GLUCONATE 0.12 % MT SOLN
15.0000 mL | Freq: Once | OROMUCOSAL | Status: AC
  Administered 2022-09-25: 15 mL via OROMUCOSAL
  Filled 2022-09-25: qty 15

## 2022-09-25 MED ORDER — ACETAMINOPHEN 325 MG PO TABS
650.0000 mg | ORAL_TABLET | ORAL | Status: DC | PRN
  Administered 2022-09-25: 650 mg via ORAL
  Filled 2022-09-25: qty 2

## 2022-09-25 MED ORDER — MORPHINE SULFATE (PF) 2 MG/ML IV SOLN
2.0000 mg | INTRAVENOUS | Status: DC | PRN
  Administered 2022-09-25 – 2022-09-26 (×2): 2 mg via INTRAVENOUS
  Filled 2022-09-25 (×2): qty 1

## 2022-09-25 MED ORDER — LIDOCAINE 2% (20 MG/ML) 5 ML SYRINGE
INTRAMUSCULAR | Status: AC
  Filled 2022-09-25: qty 5

## 2022-09-25 MED ORDER — HYDROCODONE-ACETAMINOPHEN 5-325 MG PO TABS
1.0000 | ORAL_TABLET | Freq: Every day | ORAL | Status: DC
  Administered 2022-09-26: 1 via ORAL
  Filled 2022-09-25 (×2): qty 1

## 2022-09-25 MED ORDER — DOCUSATE SODIUM 100 MG PO CAPS
100.0000 mg | ORAL_CAPSULE | Freq: Two times a day (BID) | ORAL | Status: DC
  Administered 2022-09-25: 100 mg via ORAL
  Filled 2022-09-25 (×3): qty 1

## 2022-09-25 MED ORDER — HYDROCHLOROTHIAZIDE 25 MG PO TABS
25.0000 mg | ORAL_TABLET | Freq: Every morning | ORAL | Status: DC
  Administered 2022-09-26: 25 mg via ORAL
  Filled 2022-09-25 (×3): qty 1

## 2022-09-25 MED ORDER — DOCUSATE SODIUM 100 MG PO CAPS: 100.0000 mg | ORAL_CAPSULE | Freq: Every day | ORAL | Status: DC | PRN

## 2022-09-25 MED ORDER — CHLORHEXIDINE GLUCONATE 0.12 % MT SOLN: 15.0000 mL | Freq: Once | OROMUCOSAL | Status: DC

## 2022-09-25 MED ORDER — PHENYLEPHRINE 80 MCG/ML (10ML) SYRINGE FOR IV PUSH (FOR BLOOD PRESSURE SUPPORT)
PREFILLED_SYRINGE | INTRAVENOUS | Status: DC | PRN
  Administered 2022-09-25 (×2): 80 ug via INTRAVENOUS
  Administered 2022-09-25 (×2): 160 ug via INTRAVENOUS

## 2022-09-25 MED ORDER — THROMBIN 5000 UNITS EX SOLR
OROMUCOSAL | Status: DC | PRN
  Administered 2022-09-25: 5 mL via TOPICAL

## 2022-09-25 MED ORDER — PROPOFOL 10 MG/ML IV BOLUS
INTRAVENOUS | Status: DC | PRN
  Administered 2022-09-25: 140 mg via INTRAVENOUS

## 2022-09-25 MED ORDER — SUGAMMADEX SODIUM 200 MG/2ML IV SOLN
INTRAVENOUS | Status: DC | PRN
  Administered 2022-09-25: 200 mg via INTRAVENOUS

## 2022-09-25 MED ORDER — SODIUM CHLORIDE 0.9% FLUSH
3.0000 mL | Freq: Two times a day (BID) | INTRAVENOUS | Status: DC
  Administered 2022-09-25: 3 mL via INTRAVENOUS

## 2022-09-25 MED ORDER — BUPIVACAINE HCL (PF) 0.5 % IJ SOLN
INTRAMUSCULAR | Status: AC
  Filled 2022-09-25: qty 30

## 2022-09-25 MED ORDER — GABAPENTIN 300 MG PO CAPS
300.0000 mg | ORAL_CAPSULE | Freq: Three times a day (TID) | ORAL | Status: DC
  Administered 2022-09-25 – 2022-09-26 (×2): 300 mg via ORAL
  Filled 2022-09-25 (×2): qty 1

## 2022-09-25 MED ORDER — ALBUTEROL SULFATE (2.5 MG/3ML) 0.083% IN NEBU: 2.5000 mg | INHALATION_SOLUTION | RESPIRATORY_TRACT | Status: DC | PRN

## 2022-09-25 MED ORDER — 0.9 % SODIUM CHLORIDE (POUR BTL) OPTIME
TOPICAL | Status: DC | PRN
  Administered 2022-09-25: 1000 mL

## 2022-09-25 MED ORDER — LIDOCAINE 2% (20 MG/ML) 5 ML SYRINGE
INTRAMUSCULAR | Status: DC | PRN
  Administered 2022-09-25: 80 mg via INTRAVENOUS

## 2022-09-25 MED ORDER — FENTANYL CITRATE (PF) 250 MCG/5ML IJ SOLN
INTRAMUSCULAR | Status: DC | PRN
  Administered 2022-09-25 (×2): 50 ug via INTRAVENOUS
  Administered 2022-09-25: 100 ug via INTRAVENOUS
  Administered 2022-09-25: 50 ug via INTRAVENOUS

## 2022-09-25 MED ORDER — SODIUM CHLORIDE 0.9% FLUSH: 3.0000 mL | INTRAVENOUS | Status: DC | PRN

## 2022-09-25 MED ORDER — PHENYLEPHRINE HCL-NACL 20-0.9 MG/250ML-% IV SOLN
INTRAVENOUS | Status: DC | PRN
  Administered 2022-09-25: 40 ug/min via INTRAVENOUS

## 2022-09-25 MED ORDER — ONDANSETRON HCL 4 MG/2ML IJ SOLN
INTRAMUSCULAR | Status: AC
  Filled 2022-09-25: qty 2

## 2022-09-25 MED ORDER — PROMETHAZINE HCL 25 MG/ML IJ SOLN: 6.2500 mg | INTRAMUSCULAR | Status: DC | PRN

## 2022-09-25 MED ORDER — CHLORHEXIDINE GLUCONATE CLOTH 2 % EX PADS: 6.0000 | MEDICATED_PAD | Freq: Once | CUTANEOUS | Status: DC

## 2022-09-25 MED ORDER — ONDANSETRON HCL 4 MG/2ML IJ SOLN: 4.0000 mg | Freq: Four times a day (QID) | INTRAMUSCULAR | Status: DC | PRN

## 2022-09-25 MED ORDER — METHOCARBAMOL 1000 MG/10ML IJ SOLN: 500.0000 mg | Freq: Four times a day (QID) | INTRAVENOUS | Status: DC | PRN

## 2022-09-25 MED ORDER — ESTRADIOL 0.1 MG/GM VA CREA
1.0000 g | TOPICAL_CREAM | Freq: Every day | VAGINAL | Status: DC | PRN
  Filled 2022-09-25: qty 42.5

## 2022-09-25 MED ORDER — ATORVASTATIN CALCIUM 10 MG PO TABS
10.0000 mg | ORAL_TABLET | Freq: Every day | ORAL | Status: DC
  Administered 2022-09-25: 10 mg via ORAL
  Filled 2022-09-25 (×3): qty 1

## 2022-09-25 MED ORDER — EZETIMIBE 10 MG PO TABS
10.0000 mg | ORAL_TABLET | Freq: Every day | ORAL | Status: DC
  Administered 2022-09-25: 10 mg via ORAL
  Filled 2022-09-25 (×3): qty 1

## 2022-09-25 MED ORDER — LIDOCAINE-EPINEPHRINE 1 %-1:100000 IJ SOLN
INTRAMUSCULAR | Status: DC | PRN
  Administered 2022-09-25: 10 mL

## 2022-09-25 MED ORDER — ORAL CARE MOUTH RINSE: 15.0000 mL | Freq: Once | OROMUCOSAL | Status: AC

## 2022-09-25 MED ORDER — PHENYLEPHRINE 80 MCG/ML (10ML) SYRINGE FOR IV PUSH (FOR BLOOD PRESSURE SUPPORT)
PREFILLED_SYRINGE | INTRAVENOUS | Status: AC
  Filled 2022-09-25: qty 10

## 2022-09-25 MED ORDER — HYDROMORPHONE HCL 1 MG/ML IJ SOLN: 0.2500 mg | INTRAMUSCULAR | Status: DC | PRN

## 2022-09-25 MED ORDER — THROMBIN 5000 UNITS EX SOLR
CUTANEOUS | Status: AC
  Filled 2022-09-25: qty 5000

## 2022-09-25 MED ORDER — PANTOPRAZOLE SODIUM 40 MG PO TBEC
40.0000 mg | DELAYED_RELEASE_TABLET | Freq: Two times a day (BID) | ORAL | Status: DC
  Administered 2022-09-25 – 2022-09-26 (×2): 40 mg via ORAL
  Filled 2022-09-25 (×3): qty 1

## 2022-09-25 MED ORDER — DEXAMETHASONE SODIUM PHOSPHATE 10 MG/ML IJ SOLN
INTRAMUSCULAR | Status: AC
  Filled 2022-09-25: qty 1

## 2022-09-25 MED ORDER — KETOROLAC TROMETHAMINE 15 MG/ML IJ SOLN
INTRAMUSCULAR | Status: AC
  Filled 2022-09-25: qty 1

## 2022-09-25 MED ORDER — ONDANSETRON HCL 4 MG PO TABS: 4.0000 mg | ORAL_TABLET | Freq: Four times a day (QID) | ORAL | Status: DC | PRN

## 2022-09-25 MED ORDER — MENTHOL 3 MG MT LOZG: 1.0000 | LOZENGE | OROMUCOSAL | Status: DC | PRN

## 2022-09-25 MED ORDER — POLYETHYLENE GLYCOL 3350 17 G PO PACK: 17.0000 g | PACK | Freq: Every day | ORAL | Status: DC | PRN

## 2022-09-25 MED ORDER — FENTANYL CITRATE (PF) 250 MCG/5ML IJ SOLN
INTRAMUSCULAR | Status: AC
  Filled 2022-09-25: qty 5

## 2022-09-25 MED ORDER — ORAL CARE MOUTH RINSE: 15.0000 mL | Freq: Once | OROMUCOSAL | Status: DC

## 2022-09-25 MED ORDER — POLYETHYLENE GLYCOL 3350 17 G PO PACK
17.0000 g | PACK | Freq: Every day | ORAL | Status: DC
  Administered 2022-09-25: 17 g via ORAL
  Filled 2022-09-25: qty 1

## 2022-09-25 MED ORDER — FLEET ENEMA 7-19 GM/118ML RE ENEM: 1.0000 | ENEMA | Freq: Once | RECTAL | Status: DC | PRN

## 2022-09-25 MED ORDER — AMISULPRIDE (ANTIEMETIC) 5 MG/2ML IV SOLN: 10.0000 mg | Freq: Once | INTRAVENOUS | Status: DC | PRN

## 2022-09-25 MED ORDER — ACETAMINOPHEN 325 MG PO TABS: 325.0000 mg | ORAL_TABLET | Freq: Once | ORAL | Status: DC | PRN

## 2022-09-25 MED ORDER — NITROFURANTOIN MONOHYD MACRO 100 MG PO CAPS
100.0000 mg | ORAL_CAPSULE | Freq: Every day | ORAL | Status: DC
  Administered 2022-09-25 – 2022-09-26 (×2): 100 mg via ORAL
  Filled 2022-09-25 (×3): qty 1

## 2022-09-25 MED ORDER — LIDOCAINE-EPINEPHRINE 1 %-1:100000 IJ SOLN
INTRAMUSCULAR | Status: AC
  Filled 2022-09-25: qty 1

## 2022-09-25 MED ORDER — ACETAMINOPHEN 650 MG RE SUPP: 650.0000 mg | RECTAL | Status: DC | PRN

## 2022-09-25 MED ORDER — ROCURONIUM BROMIDE 10 MG/ML (PF) SYRINGE
PREFILLED_SYRINGE | INTRAVENOUS | Status: AC
  Filled 2022-09-25: qty 10

## 2022-09-25 MED ORDER — THROMBIN 5000 UNITS EX SOLR
CUTANEOUS | Status: AC
  Filled 2022-09-25: qty 10000

## 2022-09-25 MED ORDER — LACTATED RINGERS IV SOLN: INTRAVENOUS | Status: DC

## 2022-09-25 MED ORDER — KETOROLAC TROMETHAMINE 30 MG/ML IJ SOLN
INTRAMUSCULAR | Status: AC
  Filled 2022-09-25: qty 1

## 2022-09-25 MED ORDER — DEXAMETHASONE SODIUM PHOSPHATE 10 MG/ML IJ SOLN
INTRAMUSCULAR | Status: DC | PRN
  Administered 2022-09-25: 10 mg via INTRAVENOUS

## 2022-09-25 MED ORDER — ACETAMINOPHEN 160 MG/5ML PO SOLN: 325.0000 mg | Freq: Once | ORAL | Status: DC | PRN

## 2022-09-25 MED ORDER — BUPIVACAINE HCL (PF) 0.5 % IJ SOLN
INTRAMUSCULAR | Status: DC | PRN
  Administered 2022-09-25: 20 mL
  Administered 2022-09-25: 10 mL

## 2022-09-25 MED ORDER — ROCURONIUM BROMIDE 10 MG/ML (PF) SYRINGE
PREFILLED_SYRINGE | INTRAVENOUS | Status: DC | PRN
  Administered 2022-09-25: 70 mg via INTRAVENOUS
  Administered 2022-09-25: 10 mg via INTRAVENOUS

## 2022-09-25 MED ORDER — BISACODYL 10 MG RE SUPP: 10.0000 mg | Freq: Every day | RECTAL | Status: DC | PRN

## 2022-09-25 MED ORDER — SODIUM CHLORIDE 0.9 % IV SOLN
250.0000 mL | INTRAVENOUS | Status: DC
  Administered 2022-09-25: 250 mL via INTRAVENOUS

## 2022-09-25 MED ORDER — ONDANSETRON HCL 4 MG/2ML IJ SOLN
INTRAMUSCULAR | Status: DC | PRN
  Administered 2022-09-25: 4 mg via INTRAVENOUS

## 2022-09-25 MED ORDER — IRBESARTAN 150 MG PO TABS
150.0000 mg | ORAL_TABLET | Freq: Every day | ORAL | Status: DC
  Administered 2022-09-25: 150 mg via ORAL
  Filled 2022-09-25 (×3): qty 1

## 2022-09-25 MED ORDER — BUPROPION HCL ER (XL) 150 MG PO TB24: 150.0000 mg | ORAL_TABLET | Freq: Every day | ORAL | Status: DC

## 2022-09-25 MED ORDER — KETOROLAC TROMETHAMINE 15 MG/ML IJ SOLN
7.5000 mg | Freq: Four times a day (QID) | INTRAMUSCULAR | Status: AC
  Administered 2022-09-25 – 2022-09-26 (×3): 7.5 mg via INTRAVENOUS
  Filled 2022-09-25 (×2): qty 1

## 2022-09-25 MED ORDER — PHENOL 1.4 % MT LIQD: 1.0000 | OROMUCOSAL | Status: DC | PRN

## 2022-09-25 MED ORDER — CEFAZOLIN SODIUM-DEXTROSE 2-4 GM/100ML-% IV SOLN
2.0000 g | Freq: Three times a day (TID) | INTRAVENOUS | Status: AC
  Administered 2022-09-25 (×2): 2 g via INTRAVENOUS
  Filled 2022-09-25 (×2): qty 100

## 2022-09-25 MED ORDER — CEFAZOLIN SODIUM-DEXTROSE 2-4 GM/100ML-% IV SOLN
2.0000 g | INTRAVENOUS | Status: AC
  Administered 2022-09-25: 2 g via INTRAVENOUS
  Filled 2022-09-25: qty 100

## 2022-09-25 MED ORDER — ACETAMINOPHEN 10 MG/ML IV SOLN: 1000.0000 mg | Freq: Once | INTRAVENOUS | Status: DC | PRN

## 2022-09-25 SURGICAL SUPPLY — 50 items
ADH SKN CLS APL DERMABOND .7 (GAUZE/BANDAGES/DRESSINGS) ×1
BAG COUNTER SPONGE SURGICOUNT (BAG) ×1 IMPLANT
BAG SPNG CNTER NS LX DISP (BAG) ×2
BAND INSRT 18 STRL LF DISP RB (MISCELLANEOUS) ×2
BAND RUBBER #18 3X1/16 STRL (MISCELLANEOUS) IMPLANT
BLADE CLIPPER SURG (BLADE) IMPLANT
BUR ACORN 6.0 (BURR) IMPLANT
BUR MATCHSTICK NEURO 3.0 LAGG (BURR) ×1 IMPLANT
CANISTER SUCT 3000ML PPV (MISCELLANEOUS) ×1 IMPLANT
DERMABOND ADVANCED .7 DNX12 (GAUZE/BANDAGES/DRESSINGS) ×1 IMPLANT
DEVICE DISSECT PLASMABLAD 3.0S (MISCELLANEOUS) IMPLANT
DRAPE C-ARM 42X72 X-RAY (DRAPES) IMPLANT
DRAPE C-ARMOR (DRAPES) IMPLANT
DRAPE HALF SHEET 40X57 (DRAPES) IMPLANT
DRAPE LAPAROTOMY 100X72X124 (DRAPES) ×1 IMPLANT
DRAPE MICROSCOPE SLANT 54X150 (MISCELLANEOUS) IMPLANT
DURAPREP 26ML APPLICATOR (WOUND CARE) ×1 IMPLANT
ELECT REM PT RETURN 9FT ADLT (ELECTROSURGICAL) ×1
ELECTRODE REM PT RTRN 9FT ADLT (ELECTROSURGICAL) ×1 IMPLANT
GAUZE 4X4 16PLY ~~LOC~~+RFID DBL (SPONGE) IMPLANT
GAUZE SPONGE 4X4 12PLY STRL (GAUZE/BANDAGES/DRESSINGS) ×1 IMPLANT
GLOVE BIOGEL PI IND STRL 7.5 (GLOVE) IMPLANT
GLOVE BIOGEL PI IND STRL 8.5 (GLOVE) ×1 IMPLANT
GLOVE ECLIPSE 8.5 STRL (GLOVE) ×1 IMPLANT
GOWN STRL REUS W/ TWL LRG LVL3 (GOWN DISPOSABLE) IMPLANT
GOWN STRL REUS W/ TWL XL LVL3 (GOWN DISPOSABLE) IMPLANT
GOWN STRL REUS W/TWL 2XL LVL3 (GOWN DISPOSABLE) ×1 IMPLANT
GOWN STRL REUS W/TWL LRG LVL3 (GOWN DISPOSABLE)
GOWN STRL REUS W/TWL XL LVL3 (GOWN DISPOSABLE) ×1
HEMOSTAT POWDER KIT SURGIFOAM (HEMOSTASIS) IMPLANT
KIT BASIN OR (CUSTOM PROCEDURE TRAY) ×1 IMPLANT
KIT TURNOVER KIT B (KITS) ×1 IMPLANT
NDL SPNL 20GX3.5 QUINCKE YW (NEEDLE) IMPLANT
NEEDLE HYPO 22GX1.5 SAFETY (NEEDLE) ×1 IMPLANT
NEEDLE SPNL 20GX3.5 QUINCKE YW (NEEDLE) ×1 IMPLANT
NS IRRIG 1000ML POUR BTL (IV SOLUTION) ×1 IMPLANT
PACK LAMINECTOMY NEURO (CUSTOM PROCEDURE TRAY) ×1 IMPLANT
PAD ARMBOARD 7.5X6 YLW CONV (MISCELLANEOUS) ×3 IMPLANT
PATTIES SURGICAL .5 X1 (DISPOSABLE) ×1 IMPLANT
PLASMABLADE 3.0S (MISCELLANEOUS) ×1
SPIKE FLUID TRANSFER (MISCELLANEOUS) ×1 IMPLANT
SPONGE SURGIFOAM ABS GEL SZ50 (HEMOSTASIS) ×1 IMPLANT
SUT VIC AB 1 CT1 18XBRD ANBCTR (SUTURE) ×1 IMPLANT
SUT VIC AB 1 CT1 8-18 (SUTURE) ×1
SUT VIC AB 2-0 CP2 18 (SUTURE) ×1 IMPLANT
SUT VIC AB 3-0 SH 8-18 (SUTURE) ×1 IMPLANT
SUT VIC AB 4-0 RB1 18 (SUTURE) ×1 IMPLANT
TOWEL GREEN STERILE (TOWEL DISPOSABLE) ×1 IMPLANT
TOWEL GREEN STERILE FF (TOWEL DISPOSABLE) ×1 IMPLANT
WATER STERILE IRR 1000ML POUR (IV SOLUTION) ×1 IMPLANT

## 2022-09-25 NOTE — H&P (Signed)
Lisa Moody is an 76 y.o. female.   Chief Complaint: Back and right lower extremity pain HPI: Patient is a 76 year old individual who has had significant back and a crescendo of right lower extremity pain and weakness in the proximal right leg.  She has evidence of significant psoas weakness that has gotten worse here recently and is not responding to conservative management including a recent trial of an epidural steroid injection.  An MRI demonstrates the presence of significant foraminal stenosis on the right side at the level of L2-3 and a central stenosis that is moderate but stable at the level of L3-4.  After the careful consideration of her options I advised surgical decompression of L3-4 and L2-3.  Past Medical History:  Diagnosis Date   Arthritis    Asthma    seasonal   Back pain    Benign neoplasm of colon    Complication of anesthesia    anectine - caused her to have difficulty waking up and was on vent for few hours post op   Complication of anesthesia    pulmonary embolism 2006 -pt states it was determined that it was due to having the liposuction   COVID 2021   mild case   Depression    no medications at this time   Diverticulitis    Diverticulosis of colon (without mention of hemorrhage)    Endometriosis    reason for hysterectomy.  ovaries remain.    Esophageal reflux    few times per month not routine   Esophagitis, unspecified    Headache(784.0)    use to have migraines - no longer have them   Hypertension    Osteopenia    Osteopenia    Other and unspecified hyperlipidemia    Other and unspecified hyperlipidemia    Other constipation    Palpitations    Peripheral vascular disease (HCC)    blood clots in lungs about 15 years ago, after tummy tuck surgery with lipo   Personal history of other mental disorder    PONV (postoperative nausea and vomiting)    Pulmonary embolism ([place])    2006   Sleep apnea    no cpap, did weight loss    Past Surgical  History:  Procedure Laterality Date   ABDOMINAL HYSTERECTOMY     ABDOMINOPLASTY     APPENDECTOMY     BUNIONECTOMY WITH WEIL OSTEOTOMY Right 03/09/2020   Procedure: RIGHT DISTAL CHEVRON OSTEOTOMY BUNION CORRECTION;  Surgeon: Erle Crocker, MD;  Location: [facility];  Service: Orthopedics;  Laterality: Right;  PROCEDURE: RIGHT DISTAL CHEVRON OSTEOTOMY BUNION CORRECTION, POSSIBLE 2ND HAMMERTOE CORRECTION  SURGERY REQUEST TIME: 2 HOURS   CARDIAC CATHETERIZATION     09/06/10: normal coronaries and LV function (saw Dr. Rollene Fare in 2011)   COLONOSCOPY     JOINT REPLACEMENT     knee replacement   LIPOSUCTION     LUMBAR FUSION  March 2014   LUMBAR LAMINECTOMY     PARTIAL HYSTERECTOMY     SHOULDER ARTHROSCOPY WITH ROTATOR CUFF REPAIR AND SUBACROMIAL DECOMPRESSION Right 02/26/2015   Procedure: RIGHT SHOULDER ARTHROSCOPY DEBRIDEMENT WITH SUBACROMIAL DECOMPRESSION, , ROTATOR CUFF REPAIR ;  Surgeon: Elsie Saas, MD;  Location: [facility];  Service: Orthopedics;  Laterality: Right;   TOTAL KNEE ARTHROPLASTY  2010   left   TUBAL LIGATION      Family History  Problem Relation Age of Onset   Tics Father    Lung cancer Father  Breast cancer Mother    Diabetes Mother    Diabetes Paternal Grandmother    Colon cancer Neg Hx    Esophageal cancer Neg Hx    Rectal cancer Neg Hx    Stomach cancer Neg Hx    Colon polyps Neg Hx    Social History:  reports that she has never smoked. She has been exposed to tobacco smoke. She has never used smokeless tobacco. She reports current alcohol use. She reports that she does not use drugs.  Allergies:  Allergies  Allergen Reactions   Anectine  [Succinylcholine Chloride] Anaphylaxis   Succinylcholine     Respiratory suppression    Ciprofloxacin     Other reaction(s): nausea and vomiting   Multi Vitamin-Fluoride [Multivitamins-Fluoride] Nausea Only    iron    Medications Prior to Admission  Medication Sig Dispense  Refill   atorvastatin (LIPITOR) 10 MG tablet Take 10 mg by mouth daily.     celecoxib (CELEBREX) 200 MG capsule Take 200 mg by mouth daily.     Cholecalciferol (VITAMIN D-3) 125 MCG (5000 UT) TABS Take 5,000 Units by mouth daily.     estradiol (ESTRACE) 0.1 MG/GM vaginal cream Place 1 g vaginally daily as needed (UTI).     ezetimibe (ZETIA) 10 MG tablet Take 10 mg by mouth daily.     hydrochlorothiazide (HYDRODIURIL) 25 MG tablet Take 25 mg by mouth every morning.     HYDROcodone-acetaminophen (NORCO/VICODIN) 5-325 MG tablet Take 1 tablet by mouth daily.     ibuprofen (ADVIL) 600 MG tablet Take 600 mg by mouth every 6 (six) hours as needed.     nitrofurantoin, macrocrystal-monohydrate, (MACROBID) 100 MG capsule Take 100 mg by mouth daily.     pantoprazole (PROTONIX) 40 MG tablet Take 1 tablet (40 mg total) by mouth 2 (two) times daily. Office visit for further refills 60 tablet 2   polyethylene glycol (MIRALAX / GLYCOLAX) packet Take 17 g by mouth at bedtime.     Probiotic Product (PROBIOTIC PO) Take by mouth.     telmisartan (MICARDIS) 40 MG tablet Take 40 mg by mouth at bedtime.     acyclovir ointment (ZOVIRAX) 5 % Apply 1 Application topically daily as needed (Blisters).     albuterol (VENTOLIN HFA) 108 (90 Base) MCG/ACT inhaler INHALE 2 PUFFS EVERY 4 HOURS AS NEEDED FOR WHEEZING, COUGH, AND SHORTNESS OF BREATH     buPROPion (WELLBUTRIN XL) 150 MG 24 hr tablet Take 1 tablet (150 mg total) by mouth daily. (Patient not taking: Reported on 09/20/2022) 90 tablet 3   docusate sodium (COLACE) 100 MG capsule Take 100 mg by mouth daily as needed for mild constipation or moderate constipation.      Results for orders placed or performed during the hospital encounter of 09/25/22 (from the past 48 hour(s))  Basic metabolic panel per protocol     Status: Abnormal   Collection Time: 09/25/22  5:46 AM  Result Value Ref Range   Sodium 140 135 - 145 mmol/L   Potassium 3.9 3.5 - 5.1 mmol/L   Chloride 108  98 - 111 mmol/L   CO2 22 22 - 32 mmol/L   Glucose, Bld 115 (H) 70 - 99 mg/dL    Comment: Glucose reference range applies only to samples taken after fasting for at least 8 hours.   BUN 25 (H) 8 - 23 mg/dL   Creatinine, Ser 0.65 0.44 - 1.00 mg/dL   Calcium 9.5 8.9 - 10.3 mg/dL   GFR, Estimated >60 >  60 mL/min    Comment: (NOTE) Calculated using the CKD-EPI Creatinine Equation (2021)    Anion gap 10 5 - 15    Comment: Performed at [facility], [place] [address] [place], [place] [address]  CBC per protocol     Status: None   Collection Time: 09/25/22  5:46 AM  Result Value Ref Range   WBC 9.3 4.0 - 10.5 K/uL   RBC 4.27 3.87 - 5.11 MIL/uL   Hemoglobin 13.3 12.0 - 15.0 g/dL   HCT 39.8 36.0 - 46.0 %   MCV 93.2 80.0 - 100.0 fL   MCH 31.1 26.0 - 34.0 pg   MCHC 33.4 30.0 - 36.0 g/dL   RDW 14.0 11.5 - 15.5 %   Platelets 175 150 - 400 K/uL   nRBC 0.0 0.0 - 0.2 %    Comment: Performed at [facility], [place] [address] [place], [place] [address]  Type and screen     Status: None   Collection Time: 09/25/22  5:55 AM  Result Value Ref Range   ABO/RH(D) O POS    Antibody Screen NEG    Sample Expiration      09/28/2022,2359 Performed at [facility], [place] [address] [place], [place] [address]    No results found.  Review of Systems  Constitutional:  Positive for activity change.  Musculoskeletal:  Positive for back pain and gait problem.  Neurological:  Positive for weakness and numbness.  All other systems reviewed and are negative.   Blood pressure 134/83, pulse 81, temperature (!) 97.5 F (36.4 C), temperature source Oral, resp. rate 20, height 5' 2.5" (1.588 m), weight 74.8 kg, SpO2 94 %. Physical Exam Constitutional:      Appearance: Normal appearance.  HENT:     Head: Normocephalic.     Right Ear: Tympanic membrane, ear canal and external ear normal.     Left Ear: Tympanic membrane, ear canal and external ear normal.     Nose: Nose normal.     Mouth/Throat:      Mouth: Mucous membranes are moist.     Pharynx: Oropharynx is clear.  Eyes:     Conjunctiva/sclera: Conjunctivae normal.     Pupils: Pupils are equal, round, and reactive to light.  Cardiovascular:     Rate and Rhythm: Normal rate and regular rhythm.     Pulses: Normal pulses.     Heart sounds: Normal heart sounds.  Pulmonary:     Effort: Pulmonary effort is normal.     Breath sounds: Normal breath sounds.  Abdominal:     General: Abdomen is flat. Bowel sounds are normal.     Palpations: Abdomen is soft.  Musculoskeletal:        General: Normal range of motion.     Cervical back: Normal range of motion and neck supple.  Skin:    General: Skin is warm and dry.     Capillary Refill: Capillary refill takes less than 2 seconds.  Neurological:     General: No focal deficit present.     Mental Status: She is alert.     Comments: Right iliopsoas weakness  Psychiatric:        Mood and Affect: Mood normal.        Behavior: Behavior normal.        Thought Content: Thought content normal.        Judgment: Judgment normal.      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  Plan: Decompression L2-3 and L3-4.  Earleen [place], MD 09/25/2022, 8:03 AM

## 2022-09-25 NOTE — Anesthesia Postprocedure Evaluation (Signed)
Anesthesia Post Note  Patient: Lisa Moody  Procedure(s) Performed: Right Lumbar two-three, Bilateral Lumbar three-four Laminotomy/Foraminotomy (Bilateral: Back)     Patient location during evaluation: PACU Anesthesia Type: General Level of consciousness: awake and alert Pain management: pain level controlled Vital Signs Assessment: post-procedure vital signs reviewed and stable Respiratory status: spontaneous breathing, nonlabored ventilation, respiratory function stable and patient connected to nasal cannula oxygen Cardiovascular status: blood pressure returned to baseline and stable Postop Assessment: no apparent nausea or vomiting Anesthetic complications: no   No notable events documented.  Last Vitals:  Vitals:   09/25/22 1115 09/25/22 1146  BP: 121/74 125/76  Pulse: 91 92  Resp: 16 19  Temp: 36.7 C 36.9 C  SpO2: 100% 96%                 Effie Berkshire

## 2022-09-25 NOTE — Progress Notes (Signed)
Patient ID: Lisa Moody, female   DOB: [birth year], 76 y.o.   MRN: [id number] Pain post op much as preop Incision clean and dry Motor ok but pain in gluteus and groin.  Hip moves well. Will start gabapentin Observe overnight

## 2022-09-25 NOTE — Transfer of Care (Signed)
Immediate Anesthesia Transfer of Care Note  Patient: Lisa Moody  Procedure(s) Performed: Right Lumbar two-three, Bilateral Lumbar three-four Laminotomy/Foraminotomy (Bilateral: Back)  Patient Location: PACU  Anesthesia Type:General  Level of Consciousness: awake, alert , and oriented  Airway & Oxygen Therapy: Patient Spontanous Breathing and Patient connected to face mask oxygen  Post-op Assessment: Report given to RN and Post -op Vital signs reviewed and stable  Post vital signs: Reviewed and stable  Last Vitals:  Vitals Value Taken Time  BP 142/73 09/25/22 1032  Temp 36.7 C 09/25/22 1032  Pulse 96 09/25/22 1033  Resp 11 09/25/22 1033  SpO2 98 % 09/25/22 1033  Vitals shown include unvalidated device data.  Last Pain:  Vitals:   09/25/22 0640  TempSrc: Oral  PainSc: 10-Worst pain ever         Complications: No notable events documented.

## 2022-09-25 NOTE — Progress Notes (Signed)
PT Cancellation Note  Patient Details Name: Lisa Moody MRN: [id number] DOB: [birth year]   Cancelled Treatment:    Reason Eval/Treat Not Completed: PT screened, no needs identified, will sign off. Pt seen by OT and OT reports pt is mobilizing at supervision level and activity tolerance limited by pain. No acute PT needs.   Shary Decamp [facility] 09/25/2022, 3:23 PM [place] Office [phone]

## 2022-09-25 NOTE — Anesthesia Preprocedure Evaluation (Addendum)
Anesthesia Evaluation  Patient identified by MRN, date of birth, ID band Patient awake    Reviewed: Allergy & Precautions, NPO status , Patient's Chart, lab work & pertinent test results  History of Anesthesia Complications (+) PONV and history of anesthetic complications  Airway Mallampati: II  TM Distance: >3 FB Neck ROM: Full    Dental  (+) Teeth Intact, Dental Advisory Given   Pulmonary asthma , sleep apnea    breath sounds clear to auscultation       Cardiovascular hypertension, Pt. on medications + Peripheral Vascular Disease   Rhythm:Regular Rate:Normal     Neuro/Psych  Headaches PSYCHIATRIC DISORDERS  Depression       GI/Hepatic Neg liver ROS,GERD  Medicated,,  Endo/Other    Renal/GU      Musculoskeletal   Abdominal Normal abdominal exam  (+)   Peds  Hematology   Anesthesia Other Findings   Reproductive/Obstetrics                             Anesthesia Physical Anesthesia Plan  ASA: 3  Anesthesia Plan: General   Post-op Pain Management:    Induction: Intravenous  PONV Risk Score and Plan: 4 or greater and Ondansetron, Dexamethasone, Midazolam and Scopolamine patch - Pre-op  Airway Management Planned: Oral ETT  Additional Equipment: None  Intra-op Plan:   Post-operative Plan: Extubation in OR  Informed Consent: I have reviewed the patients History and Physical, chart, labs and discussed the procedure including the risks, benefits and alternatives for the proposed anesthesia with the patient or authorized representative who has indicated his/her understanding and acceptance.     Dental advisory given  Plan Discussed with: CRNA  Anesthesia Plan Comments:        Anesthesia Quick Evaluation

## 2022-09-25 NOTE — Evaluation (Signed)
Occupational Therapy Evaluation and Discharge Patient Details Name: Lisa Moody MRN: [id number] DOB: 1946-09-06 Today's Date: 09/25/2022   History of Present Illness Lisa Moody is a 76 yo female s/p decompression of L3-4 and L2-3 (per pre op note) due to significant back and a crescendo of right lower extremity pain and weakness in the proximal right leg.MRI demonstrates the presence of significant foraminal stenosis on the right side at the level of L2-3 and a central stenosis that is moderate but stable at the level of L3-4.   Clinical Impression   This 76 yo female admitted and underwent above presents to acute OT with PLOF before 2 weeks ago of being totally independent with all basic ADLs, IADLs, and driving. Currently she is at an overall S level with RW for basic ADLs with pain limiting her in how much activity she can tolerate. No further acute OT needs identified, we will sign off. No skilled PT needs identified and I have made them aware.      Recommendations for follow up therapy are one component of a multi-disciplinary discharge planning process, led by the attending physician.  Recommendations may be updated based on patient status, additional functional criteria and insurance authorization.   Follow Up Recommendations  No OT follow up    Assistance Recommended at Discharge PRN  Patient can return home with the following Assistance with cooking/housework;Assist for transportation    Functional Status Assessment  Patient has had a recent decline in their functional status and demonstrates the ability to make significant improvements in function in a reasonable and predictable amount of time. (without further need for skilled OT and not skilled PT needs identified.)  Equipment Recommendations  None recommended by OT       Precautions / Restrictions Precautions Precautions: Back Precaution Booklet Issued: Yes (comment) Required Braces or Orthoses:   (none) Restrictions Weight Bearing Restrictions: No      Mobility Bed Mobility Overal bed mobility: Modified Independent                  Transfers Overall transfer level: Needs assistance   Transfers: Sit to/from Stand Sit to Stand: Supervision           General transfer comment: started out without RW to ambulate but pt reports still with prior surgical pain, encouraged her to try RW and this did help some with making the pain a little more bearable but still not good. Ambulated in hallway      Balance Overall balance assessment: Mild deficits observed, not formally tested                                         ADL either performed or assessed with clinical judgement   ADL Overall ADL's : Modified independent                                       General ADL Comments: Educated on LBD, use of 2 cups for brushing teeth, use of wet wipes for back pericare, back precautions, limiting sitting starting at 20-30 minutes and building up to an hour--getting up and walking around in between during waking hours, pillow placement while in bed to better support back positioning, sit<>stand stance that keeps back more straight.     Vision Baseline Vision/History: 1 Wears  glasses Ability to See in Adequate Light: 0 Adequate Patient Visual Report: No change from baseline              Pertinent Vitals/Pain Pain Assessment Pain Assessment: 0-10 Pain Score: 8  Pain Location: right groin Pain Descriptors / Indicators: Aching, Grimacing, Guarding, Moaning, Sore Pain Intervention(s): Limited activity within patient's tolerance, Monitored during session, Repositioned, Ice applied     Hand Dominance Right   Extremity/Trunk Assessment Upper Extremity Assessment Upper Extremity Assessment: Overall WFL for tasks assessed   Lower Extremity Assessment Lower Extremity Assessment:  (overall more weak in RLE than LLE due to increasing pain an  less activity over the last 2 weeks.)       Communication Communication Communication: No difficulties   Cognition Arousal/Alertness: Awake/alert Behavior During Therapy: WFL for tasks assessed/performed Overall Cognitive Status: Within Functional Limits for tasks assessed                                                  Home Living Family/patient expects to be discharged to:: Private residence Living Arrangements: Spouse/significant other Available Help at Discharge: Family;Available 24 hours/day Type of Home: House Home Access: Level entry     Home Layout: One level     Bathroom Shower/Tub: Occupational psychologist: Standard     Home Equipment: Conservation officer, nature (2 wheels)          Prior Functioning/Environment Prior Level of Function : Independent/Modified Independent;Driving (up until 2 weeks ago and then has been mainly laying due to pain in sitting and standing)                        OT Problem List: Decreased strength;Impaired balance (sitting and/or standing);Pain         OT Goals(Current goals can be found in the care plan section) Acute Rehab OT Goals Patient Stated Goal: for pain to be better and possibly go home today v. staying         AM-PAC OT "6 Clicks" Daily Activity     Outcome Measure Help from another person eating meals?: None Help from another person taking care of personal grooming?: None Help from another person toileting, which includes using toliet, bedpan, or urinal?: None Help from another person bathing (including washing, rinsing, drying)?: None Help from another person to put on and taking off regular upper body clothing?: None Help from another person to put on and taking off regular lower body clothing?: None 6 Click Score: 24   End of Session Equipment Utilized During Treatment: Rolling walker (2 wheels) Nurse Communication: Mobility status (pt not sure if she is ready to go  home)  Activity Tolerance: Patient limited by pain Patient left: in bed;with call bell/phone within reach;with family/visitor present  OT Visit Diagnosis: Other abnormalities of gait and mobility (R26.89);Muscle weakness (generalized) (M62.81);Pain Pain - Right/Left: Right Pain - part of body:  (groin)                Time: 6503-5465 OT Time Calculation (min): 25 min Charges:  OT General Charges $OT Visit: 1 Visit OT Evaluation $OT Eval Moderate Complexity: 1 Mod OT Treatments $Self Care/Home Management : 8-22 mins  [address], OTR/L Acute Re[facility] [phone] Office [phone]    Almon Register 09/25/2022, 2:35 PM

## 2022-09-25 NOTE — Op Note (Signed)
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 Postoperative diagnosis: Same Procedure: Right sided laminotomy foraminotomies L2-3 bilateral laminotomies and foraminotomies L3-4 compression of the common dural tube the L2 the L3 and L4 nerve roots. Surgeon: Kristeen Miss Anesthesia: General endotracheal Indications: Lisa Moody is a 76 year old lady who has had severe right lumbar radicular pain that has developed over the last several weeks time it has become progressively more intractable despite an effort at treatment with a conservative epidural steroid injection MRI demonstrates the presence of a right lateral recess compression secondary to some hypertrophy of the facet joint on the right side at L2-3 there is also a moderate degree of stenosis at L3-4 that has previously been noted but has remained stable she is advised regarding surgery to decompress both L3 3 4 and L2-3.  Procedure: Patient was brought to the operating room supine on the stretcher.  After the smooth induction of general endotracheal anesthesia, she was carefully turned prone.  The back was shaved and prepped with alcohol DuraPrep and draped in a sterile fashion.  A midline incision was created through part of the old incision that had been present for an L4-L5 decompression and fusion.  The dissection was carried down to the lumbodorsal fascia and fluoroscopic radiographs were obtained to verify the presence of the the L3-4 and L2-3 spaces.  Then by dissecting subperiosteally on the right side the L to 3 space was open using a high-speed drill a 2 mm matchstick bur to remove the inferior margin lamina of L2 out to the mesial wall the facet partial medial facetectomy was performed the L ligament was taken up and there was noted to be markedly hypertrophied towards the medial aspect and there was significant compression of  the lateral recess and stenosis along the path for the L3 nerve root inferiorly decompression was continued until a good lateral decompression for the L2 nerve root superiorly and the L3 nerve root inferiorly was obtained then attention was turned to L3-4 where bilateral laminotomies were created, dural tube and the L3 nerve root superiorly and the L4 nerve root inferiorly were decompressed this was verified by passing a series of nerve hooks into the region thickened redundant ligamentous material was removed using curved curettes and also curved Kerrison punches and the end good decompression was obtained after inspecting this area thoroughly and checking for hemostasis 20 cc of half percent Marcaine was injected into the paraspinous musculature and fascia.  Lumbodorsal fascia was closed with #1 Vicryl in interrupted fashion 2-0 Vicryl was used in the subcutaneous tissues 3-0 and 4-0 Vicryl subcuticularly Dermabond was placed on the skin blood loss for the procedure was estimated at 75 cc.

## 2022-09-25 NOTE — Anesthesia Procedure Notes (Signed)
Procedure Name: Intubation Date/Time: 09/25/2022 8:34 AM  Performed by: Genelle Bal, CRNAPre-anesthesia Checklist: Patient identified, Emergency Drugs available, Suction available and Patient being monitored Patient Re-evaluated:Patient Re-evaluated prior to induction Oxygen Delivery Method: Circle system utilized Preoxygenation: Pre-oxygenation with 100% oxygen Induction Type: IV induction Ventilation: Mask ventilation without difficulty Laryngoscope Size: Miller and 2 Grade View: Grade I Tube type: Oral Tube size: 7.0 mm Number of attempts: 1 Airway Equipment and Method: Stylet and Bite block Placement Confirmation: ETT inserted through vocal cords under direct vision, positive ETCO2 and breath sounds checked- equal and bilateral Secured at: 21 cm Tube secured with: Tape Dental Injury: Teeth and Oropharynx as per pre-operative assessment

## 2022-09-26 ENCOUNTER — Encounter (HOSPITAL_COMMUNITY): Payer: Self-pay | Admitting: Neurological Surgery

## 2022-09-26 DIAGNOSIS — M48061 Spinal stenosis, lumbar region without neurogenic claudication: Secondary | ICD-10-CM | POA: Diagnosis not present

## 2022-09-26 MED ORDER — GABAPENTIN 300 MG PO CAPS: 300.0000 mg | ORAL_CAPSULE | Freq: Three times a day (TID) | ORAL | 3 refills | Status: AC

## 2022-09-26 MED ORDER — METHOCARBAMOL 500 MG PO TABS: 500.0000 mg | ORAL_TABLET | Freq: Four times a day (QID) | ORAL | 3 refills | Status: AC | PRN

## 2022-09-26 MED ORDER — HYDROCODONE-ACETAMINOPHEN 5-325 MG PO TABS: 1.0000 | ORAL_TABLET | Freq: Four times a day (QID) | ORAL | 0 refills | Status: DC | PRN

## 2022-09-26 NOTE — Discharge Summary (Signed)
Physician Discharge Summary  Patient ID: Lisa Moody MRN: [id number] DOB/AGE: Apr 16, 1946 76 y.o.  Admit date: 09/25/2022 Discharge date: 09/26/2022  Admission Diagnoses: Lumbar spondylosis with radiculopathy L2 and L3 on the right.  Lumbar stenosis L3-4  Discharge Diagnoses: Lumbar spondylosis and stenosis on the right L2 and L3 with lumbar radiculopathy.  Lumbar stenosis L3-L4. Principal Problem:   Lumbar radicular syndrome   Discharged Condition: fair  Hospital Course: Patient was admitted to undergo surgical decompression at L2-3 on the right and L3-4 centrally.  She was having severe pain in the region of the gluteus and right sided groin she had some weakness in the iliopsoas and quad on the right side.  She was found to have significant stenosis right side.  And central stenosis at L3-4.  She underwent surgical decompression.  Postoperatively the patient has had persistent pain in the region of the right gluteus and right groin.  She was started on gabapentin.  We discussed and reevaluated.  Patient's hip joint moves fluidly albeit the pain appears to point to the area of the hip joint that could be problematic.  She still has some weakness in the iliopsoas on the right side her ambulation is intact.  At this time she is being discharged home on gabapentin.  If no resolution of the pain occurs over the next weeks time we will consider imaging the hip itself albeit previous x-rays of the hip were normal.  Consults: None  Significant Diagnostic Studies: None  Treatments: surgery: See op note  Discharge Exam: Blood pressure 103/60, pulse (!) 102, temperature 98.5 F (36.9 C), resp. rate 16, height 5' 2.5" (1.588 m), weight 74.8 kg, SpO2 95 %. Incision is clean and dry Station and gait are intact mild weakness in the iliopsoas on the right side.  Relation is otherwise intact and normal  Disposition: Discharge disposition: 01-Home or Self Care       Discharge Instructions      Call MD for:  redness, tenderness, or signs of infection (pain, swelling, redness, odor or green/yellow discharge around incision site)   Complete by: As directed    Call MD for:  severe uncontrolled pain   Complete by: As directed    Call MD for:  temperature >100.4   Complete by: As directed    Diet - low sodium heart healthy   Complete by: As directed    Discharge wound care:   Complete by: As directed    Remove honeycomb dressing after shower.  Okay to shower without dressing on.  Pat incision area dry.  Do not put salves or ointments on incision.   Incentive spirometry RT   Complete by: As directed    Increase activity slowly   Complete by: As directed       Allergies as of 09/26/2022       Reactions   Anectine  [succinylcholine Chloride] Anaphylaxis   Succinylcholine    Respiratory suppression    Ciprofloxacin    Other reaction(s): nausea and vomiting   Multi Vitamin-fluoride [multivitamins-fluoride] Nausea Only   iron        Medication List     TAKE these medications    acyclovir ointment 5 % Commonly known as: ZOVIRAX Apply 1 Application topically daily as needed (Blisters).   albuterol 108 (90 Base) MCG/ACT inhaler Commonly known as: VENTOLIN HFA INHALE 2 PUFFS EVERY 4 HOURS AS NEEDED FOR WHEEZING, COUGH, AND SHORTNESS OF BREATH   atorvastatin 10 MG tablet Commonly known as: LIPITOR Take  10 mg by mouth daily.   buPROPion 150 MG 24 hr tablet Commonly known as: WELLBUTRIN XL Take 1 tablet (150 mg total) by mouth daily.   celecoxib 200 MG capsule Commonly known as: CELEBREX Take 200 mg by mouth daily.   docusate sodium 100 MG capsule Commonly known as: COLACE Take 100 mg by mouth daily as needed for mild constipation or moderate constipation.   estradiol 0.1 MG/GM vaginal cream Commonly known as: ESTRACE Place 1 g vaginally daily as needed (UTI).   ezetimibe 10 MG tablet Commonly known as: ZETIA Take 10 mg by mouth daily.   gabapentin 300  MG capsule Commonly known as: NEURONTIN Take 1 capsule (300 mg total) by mouth 3 (three) times daily.   hydrochlorothiazide 25 MG tablet Commonly known as: HYDRODIURIL Take 25 mg by mouth every morning.   HYDROcodone-acetaminophen 5-325 MG tablet Commonly known as: NORCO/VICODIN Take 1 tablet by mouth every 6 (six) hours as needed for moderate pain. What changed:  when to take this reasons to take this   ibuprofen 600 MG tablet Commonly known as: ADVIL Take 600 mg by mouth every 6 (six) hours as needed.   methocarbamol 500 MG tablet Commonly known as: ROBAXIN Take 1 tablet (500 mg total) by mouth every 6 (six) hours as needed for muscle spasms.   nitrofurantoin (macrocrystal-monohydrate) 100 MG capsule Commonly known as: MACROBID Take 100 mg by mouth daily.   pantoprazole 40 MG tablet Commonly known as: PROTONIX Take 1 tablet (40 mg total) by mouth 2 (two) times daily. Office visit for further refills   polyethylene glycol 17 g packet Commonly known as: MIRALAX / GLYCOLAX Take 17 g by mouth at bedtime.   PROBIOTIC PO Take by mouth.   telmisartan 40 MG tablet Commonly known as: MICARDIS Take 40 mg by mouth at bedtime.   Vitamin D-3 125 MCG (5000 UT) Tabs Take 5,000 Units by mouth daily.               Discharge Care Instructions  (From admission, onward)           Start     Ordered   09/26/22 0000  Discharge wound care:       Comments: Remove honeycomb dressing after shower.  Okay to shower without dressing on.  Pat incision area dry.  Do not put salves or ointments on incision.   09/26/22 [id number]             Signed: Blanchie Dessert Traevon Meiring 09/26/2022, 9:29 AM

## 2022-09-26 NOTE — Plan of Care (Signed)
Pt and husband given D/C instructions with verbal understanding. Rx's were sent to the pharmacy by MD. Pt's incision is clean and dry with no sign of infection. Pt's IV was removed prior to D/C. Pt D/C'd home via wheelchair per MD order. Pt is stable @ D/C and has no other needs at this time. Raynesha Tiedt, RN 

## 2022-09-29 ENCOUNTER — Other Ambulatory Visit: Payer: Self-pay

## 2022-10-05 ENCOUNTER — Other Ambulatory Visit: Payer: Self-pay | Admitting: Neurological Surgery

## 2022-10-05 DIAGNOSIS — M25551 Pain in right hip: Secondary | ICD-10-CM

## 2022-10-09 ENCOUNTER — Ambulatory Visit
Admission: RE | Admit: 2022-10-09 | Discharge: 2022-10-09 | Disposition: A | Discharge status: Home / Self Care | Payer: 59 | Source: Ambulatory Visit | Attending: Neurological Surgery | Admitting: Neurological Surgery

## 2022-10-09 DIAGNOSIS — M25551 Pain in right hip: Secondary | ICD-10-CM

## 2022-10-31 ENCOUNTER — Other Ambulatory Visit: Payer: Self-pay | Admitting: Neurological Surgery

## 2022-10-31 DIAGNOSIS — M5416 Radiculopathy, lumbar region: Secondary | ICD-10-CM

## 2022-11-23 ENCOUNTER — Ambulatory Visit
Admission: RE | Admit: 2022-11-23 | Discharge: 2022-11-23 | Disposition: A | Discharge status: Home / Self Care | Payer: 59 | Source: Ambulatory Visit | Attending: Neurological Surgery | Admitting: Neurological Surgery

## 2022-11-23 DIAGNOSIS — M5416 Radiculopathy, lumbar region: Secondary | ICD-10-CM

## 2022-11-23 MED ORDER — GADOPICLENOL 0.5 MMOL/ML IV SOLN
7.0000 mL | Freq: Once | INTRAVENOUS | Status: AC | PRN
  Administered 2022-11-23: 7 mL via INTRAVENOUS

## 2022-12-15 ENCOUNTER — Other Ambulatory Visit: Payer: Self-pay | Admitting: Registered Nurse

## 2022-12-15 ENCOUNTER — Ambulatory Visit
Admission: RE | Admit: 2022-12-15 | Discharge: 2022-12-15 | Disposition: A | Discharge status: Home / Self Care | Payer: 59 | Source: Ambulatory Visit | Attending: Registered Nurse | Admitting: Registered Nurse

## 2022-12-15 DIAGNOSIS — G8929 Other chronic pain: Secondary | ICD-10-CM

## 2022-12-15 MED ORDER — IOPAMIDOL (ISOVUE-300) INJECTION 61%
100.0000 mL | Freq: Once | INTRAVENOUS | Status: AC | PRN
  Administered 2022-12-15: 100 mL via INTRAVENOUS

## 2023-01-03 NOTE — Progress Notes (Signed)
Sent message, via epic in basket, requesting orders in epic from surgeon.  

## 2023-01-09 NOTE — Progress Notes (Signed)
COVID Vaccine Completed: yes  Date of COVID positive in last 90 days:  PCP - Crist Infante, MD Cardiologist -   Chest x-ray -  EKG - 09/25/22 Epic Stress Test -  ECHO -  Cardiac Cath - 2011 Pacemaker/ICD device last checked: Spinal Cord Stimulator:  Bowel Prep -   Sleep Study -  CPAP -   Fasting Blood Sugar -  Checks Blood Sugar _____ times a day  Last dose of GLP1 agonist-  N/A GLP1 instructions:  N/A   Last dose of SGLT-2 inhibitors-  N/A SGLT-2 instructions: N/A   Blood Thinner Instructions: Aspirin Instructions: Last Dose:  Activity level:  Can go up a flight of stairs and perform activities of daily living without stopping and without symptoms of chest pain or shortness of breath.  Able to exercise without symptoms  Unable to go up a flight of stairs without symptoms of     Anesthesia review:   Patient denies shortness of breath, fever, cough and chest pain at PAT appointment  Patient verbalized understanding of instructions that were given to them at the PAT appointment. Patient was also instructed that they will need to review over the PAT instructions again at home before surgery.

## 2023-01-09 NOTE — Patient Instructions (Signed)
SURGICAL WAITING ROOM VISITATION  Patients having surgery or a procedure may have no more than 2 support people in the waiting area - these visitors may rotate.    Children under the age of 45 must have an adult with them who is not the patient.  Due to an increase in RSV and influenza rates and associated hospitalizations, children ages 65 and under may not visit patients in [place].  If the patient needs to stay at the hospital during part of their recovery, the visitor guidelines for inpatient rooms apply. Pre-op nurse will coordinate an appropriate time for 1 support person to accompany patient in pre-op.  This support person may not rotate.    Please refer to the [facility] website for the visitor guidelines for Inpatients (after your surgery is over and you are in a regular room).     Your procedure is scheduled on: 01/30/23   Report to [facility] Main Entrance    Report to admitting at 5:15 AM   Call this number if you have problems the morning of surgery [phone]   Do not eat food :After Midnight.   After Midnight you may have the following liquids until 4:30 AM DAY OF SURGERY  Water Non-Citrus Juices (without pulp, NO RED-Apple, White grape, White cranberry) Black Coffee (NO MILK/CREAM OR CREAMERS, sugar ok)  Clear Tea (NO MILK/CREAM OR CREAMERS, sugar ok) regular and decaf                             Plain Jell-O (NO RED)                                           Fruit ices (not with fruit pulp, NO RED)                                     Popsicles (NO RED)                                                               Sports drinks like Gatorade (NO RED)                The day of surgery:  Drink ONE (1) Pre-Surgery Clear Ensure at 4:30 AM the morning of surgery. Drink in one sitting. Do not sip.  This drink was given to you during your hospital  pre-op appointment visit. Nothing else to drink after completing the  Pre-Surgery Clear  Ensure.          If you have questions, please contact your surgeon's office.   FOLLOW BOWEL PREP AND ANY ADDITIONAL PRE OP INSTRUCTIONS YOU RECEIVED FROM YOUR SURGEON'S OFFICE!!!     Oral Hygiene is also important to reduce your risk of infection.                                    Remember - BRUSH YOUR TEETH THE MORNING OF SURGERY WITH YOUR REGULAR TOOTHPASTE  DENTURES WILL  BE REMOVED PRIOR TO SURGERY PLEASE DO NOT APPLY "Poly grip" OR ADHESIVES!!!   Take these medicines the morning of surgery with A SIP OF WATER: Inhalers, Zetia, Atorvastatin, Pantoprazole                               You may not have any metal on your body including hair pins, jewelry, and body piercing             Do not wear make-up, lotions, powders, perfumes, or deodorant  Do not wear nail polish including gel and S&S, artificial/acrylic nails, or any other type of covering on natural nails including finger and toenails. If you have artificial nails, gel coating, etc. that needs to be removed by a nail salon please have this removed prior to surgery or surgery may need to be canceled/ delayed if the surgeon/ anesthesia feels like they are unable to be safely monitored.   Do not shave  48 hours prior to surgery.    Do not bring valuables to the hospital. [place].   Contacts, glasses, dentures or bridgework may not be worn into surgery.  DO NOT [place]. PHARMACY WILL DISPENSE MEDICATIONS LISTED ON YOUR MEDICATION LIST TO YOU DURING YOUR ADMISSION [place]!    Patients discharged on the day of surgery will not be allowed to drive home.  Someone NEEDS to stay with you for the first 24 hours after anesthesia.   Special Instructions: Bring a copy of your healthcare power of attorney and living will documents the day of surgery if you haven't scanned them before.              Please read over the following fact sheets  you were given: IF [place] [phone] Schneiders   If you received a COVID test during your pre-op visit  it is requested that you wear a mask when out in public, stay away from anyone that may not be feeling well and notify your surgeon if you develop symptoms. If you test positive for Covid or have been in contact with anyone that has tested positive in the last 10 days please notify you surgeon.    [facility] - Preparing for Surgery Before surgery, you can play an important role.  Because skin is not sterile, your skin needs to be as free of germs as possible.  You can reduce the number of germs on your skin by washing with CHG (chlorahexidine gluconate) soap before surgery.  CHG is an antiseptic cleaner which kills germs and bonds with the skin to continue killing germs even after washing. Please DO NOT use if you have an allergy to CHG or antibacterial soaps.  If your skin becomes reddened/irritated stop using the CHG and inform your nurse when you arrive at Short Stay. Do not shave (including legs and underarms) for at least 48 hours prior to the first CHG shower.  You may shave your face/neck.  Please follow these instructions carefully:  1.  Shower with CHG Soap the night before surgery and the  morning of surgery.  2.  If you choose to wash your hair, wash your hair first as usual with your normal  shampoo.  3.  After you shampoo, rinse your hair and  body thoroughly to remove the shampoo.                             4.  Use CHG as you would any other liquid soap.  You can apply chg directly to the skin and wash.  Gently with a scrungie or clean washcloth.  5.  Apply the CHG Soap to your body ONLY FROM THE NECK DOWN.   Do   not use on face/ open                           Wound or open sores. Avoid contact with eyes, ears mouth and   genitals (private parts).                       Wash face,  Genitals (private parts) with your normal  soap.             6.  Wash thoroughly, paying special attention to the area where your    surgery  will be performed.  7.  Thoroughly rinse your body with warm water from the neck down.  8.  DO NOT shower/wash with your normal soap after using and rinsing off the CHG Soap.                9.  Pat yourself dry with a clean towel.            10.  Wear clean pajamas.            11.  Place clean sheets on your bed the night of your first shower and do not  sleep with pets. Day of Surgery : Do not apply any lotions/deodorants the morning of surgery.  Please wear clean clothes to the hospital/surgery center.  FAILURE TO FOLLOW THESE INSTRUCTIONS MAY RESULT IN THE CANCELLATION OF YOUR SURGERY  PATIENT SIGNATURE_________________________________  NURSE SIGNATURE__________________________________  ________________________________________________________________________  Adam Phenix  An incentive spirometer is a tool that can help keep your lungs clear and active. This tool measures how well you are filling your lungs with each breath. Taking long deep breaths may help reverse or decrease the chance of developing breathing (pulmonary) problems (especially infection) following: A long period of time when you are unable to move or be active. BEFORE THE PROCEDURE  If the spirometer includes an indicator to show your best effort, your nurse or respiratory therapist will set it to a desired goal. If possible, sit up straight or lean slightly forward. Try not to slouch. Hold the incentive spirometer in an upright position. INSTRUCTIONS FOR USE  Sit on the edge of your bed if possible, or sit up as far as you can in bed or on a chair. Hold the incentive spirometer in an upright position. Breathe out normally. Place the mouthpiece in your mouth and seal your lips tightly around it. Breathe in slowly and as deeply as possible, raising the piston or the ball toward the top of the column. Hold your  breath for 3-5 seconds or for as long as possible. Allow the piston or ball to fall to the bottom of the column. Remove the mouthpiece from your mouth and breathe out normally. Rest for a few seconds and repeat Steps 1 through 7 at least 10 times every 1-2 hours when you are awake. Take your time and take a few normal breaths between deep breaths. The spirometer  may include an indicator to show your best effort. Use the indicator as a goal to work toward during each repetition. After each set of 10 deep breaths, practice coughing to be sure your lungs are clear. If you have an incision (the cut made at the time of surgery), support your incision when coughing by placing a pillow or rolled up towels firmly against it. Once you are able to get out of bed, walk around indoors and cough well. You may stop using the incentive spirometer when instructed by your caregiver.  RISKS AND COMPLICATIONS Take your time so you do not get dizzy or light-headed. If you are in pain, you may need to take or ask for pain medication before doing incentive spirometry. It is harder to take a deep breath if you are having pain. AFTER USE Rest and breathe slowly and easily. It can be helpful to keep track of a log of your progress. Your caregiver can provide you with a simple table to help with this. If you are using the spirometer at home, follow these instructions: [place] IF:  You are having difficultly using the spirometer. You have trouble using the spirometer as often as instructed. Your pain medication is not giving enough relief while using the spirometer. You develop fever of 100.5 F (38.1 C) or higher. SEEK IMMEDIATE MEDICAL CARE IF:  You cough up bloody sputum that had not been present before. You develop fever of 102 F (38.9 C) or greater. You develop worsening pain at or near the incision site. MAKE SURE YOU:  Understand these instructions. Will watch your condition. Will get help right  away if you are not doing well or get worse. Document Released: 03/19/2007 Document Revised: 01/29/2012 Document Reviewed: 05/20/2007 [facility] Patient Information 2014 [place], [place].   ________________________________________________________________________

## 2023-01-10 ENCOUNTER — Encounter (HOSPITAL_COMMUNITY)
Admission: RE | Admit: 2023-01-10 | Discharge: 2023-01-10 | Disposition: A | Discharge status: Home / Self Care | Payer: 59 | Source: Ambulatory Visit | Attending: Orthopedic Surgery | Admitting: Orthopedic Surgery

## 2023-01-10 ENCOUNTER — Encounter (HOSPITAL_COMMUNITY): Payer: Self-pay

## 2023-01-10 VITALS — BP 125/77 | HR 87 | Temp 97.8°F | Resp 16 | Ht 61.0 in | Wt 161.0 lb

## 2023-01-10 DIAGNOSIS — Z01812 Encounter for preprocedural laboratory examination: Secondary | ICD-10-CM | POA: Diagnosis present

## 2023-01-10 DIAGNOSIS — I2510 Atherosclerotic heart disease of native coronary artery without angina pectoris: Secondary | ICD-10-CM

## 2023-01-10 DIAGNOSIS — Z01818 Encounter for other preprocedural examination: Secondary | ICD-10-CM

## 2023-01-10 HISTORY — DX: Anemia, unspecified: D64.9

## 2023-01-10 HISTORY — DX: Pneumonia, unspecified organism: J18.9

## 2023-01-10 HISTORY — DX: Hyperlipidemia, unspecified: E78.5

## 2023-01-10 LAB — BASIC METABOLIC PANEL
Anion gap: 6 (ref 5–15)
BUN: 24 mg/dL — ABNORMAL HIGH (ref 8–23)
CO2: 24 mmol/L (ref 22–32)
Calcium: 9.2 mg/dL (ref 8.9–10.3)
Chloride: 108 mmol/L (ref 98–111)
Creatinine, Ser: 0.69 mg/dL (ref 0.44–1.00)
GFR, Estimated: >60 mL/min (ref >60)
Glucose, Bld: 95 mg/dL (ref 70–99)
Potassium: 3.9 mmol/L (ref 3.5–5.1)
Sodium: 138 mmol/L (ref 135–145)

## 2023-01-10 LAB — CBC
HCT: 38.9 % (ref 36.0–46.0)
Hemoglobin: 11.7 g/dL — ABNORMAL LOW (ref 12.0–15.0)
MCH: 28.3 pg (ref 26.0–34.0)
MCHC: 30.1 g/dL (ref 30.0–36.0)
MCV: 94 fL (ref 80.0–100.0)
Platelets: 199 10*3/uL (ref 150–400)
RBC: 4.14 MIL/uL (ref 3.87–5.11)
RDW: 15.4 % (ref 11.5–15.5)
WBC: 8.8 10*3/uL (ref 4.0–10.5)
nRBC: 0 % (ref 0.0–0.2)

## 2023-01-10 LAB — SURGICAL PCR SCREEN
MRSA, PCR: NEGATIVE
Staphylococcus aureus: NEGATIVE

## 2023-01-30 ENCOUNTER — Encounter (HOSPITAL_COMMUNITY): Admission: RE | Payer: Self-pay | Source: Ambulatory Visit

## 2023-01-30 ENCOUNTER — Ambulatory Visit (HOSPITAL_COMMUNITY): Admission: RE | Admit: 2023-01-30 | Payer: 59 | Source: Ambulatory Visit | Admitting: Orthopedic Surgery

## 2023-01-30 SURGERY — ARTHROPLASTY, HIP, TOTAL,POSTERIOR APPROACH
Anesthesia: Choice | Site: Hip | Laterality: Right

## 2023-03-01 NOTE — Progress Notes (Signed)
77 y.o. G3P3 Married Caucasian female here for annual exam.    Started having UTIs last year.  Seeing Dr. Arita Miss at [facility].  Now using estrogen cream and a probiotic.  Using her vaginal cream daily.  She left a specimen of urine yesterday at [facility], and she is waiting for results.  She does double void to empty at times.   Has urinary incontinence worse when she has a UTI.  Using Mirilax nightly to help with BMs.   Will do a hip replacement in May.   Decreased libido.  She stopped Wellbutrin due to headaches.   Feeling down due to her health issues.   PCP:   Dr. Waynard Edwards  No LMP recorded. Patient has had a hysterectomy.           Sexually active: No.  The current method of family planning is status post hysterectomy.    Exercising: No.   Smoker:  no  Health Maintenance: Pap:  2002 neg History of abnormal Pap:  no MMG:  07/13/22 Breast Density Cat B, BI-RADS CAT 1 neg Colonoscopy:  12/30/19 BMD:   08/29/16  Result  osteopenic. Now osteoporosis.  On Prolia, second year. Followed by Dr. Waynard Edwards.   TDaP:  07/28/20 Gardasil:   no HIV: n/a Hep C: n/a Screening Labs:  PCP   reports that she has never smoked. She has been exposed to tobacco smoke. She has never used smokeless tobacco. She reports current alcohol use. She reports that she does not use drugs.  Past Medical History:  Diagnosis Date   Anemia    years ago per pt   Arthritis    Asthma    seasonal   Back pain    Benign neoplasm of colon    Complication of anesthesia    anectine - caused her to have difficulty waking up and was on vent for few hours post op   Complication of anesthesia    pulmonary embolism 2006 -pt states it was determined that it was due to having the liposuction   COVID 2021   mild case   Depression    no medications at this time   Diverticulitis    Diverticulosis of colon (without mention of hemorrhage)    Endometriosis    reason for hysterectomy.  ovaries remain.    Esophageal  reflux    few times per month not routine   Esophagitis, unspecified    Headache(784.0)    use to have migraines - no longer have them   Hyperlipidemia    Hypertension    Osteopenia    Osteopenia    Osteoporosis    Other and unspecified hyperlipidemia    Other and unspecified hyperlipidemia    Other constipation    Palpitations    Peripheral vascular disease    blood clots in lungs about 15 years ago, after tummy tuck surgery with lipo   Personal history of other mental disorder    Pneumonia    PONV (postoperative nausea and vomiting)    Pulmonary embolism    2006   Sleep apnea    no cpap, did weight loss    Past Surgical History:  Procedure Laterality Date   ABDOMINAL HYSTERECTOMY     ABDOMINOPLASTY     APPENDECTOMY     BUNIONECTOMY WITH WEIL OSTEOTOMY Right 03/09/2020   Procedure: RIGHT DISTAL CHEVRON OSTEOTOMY BUNION CORRECTION;  Surgeon: Terance Hart, MD;  Location: [facility];  Service: Orthopedics;  Laterality: Right;  PROCEDURE: RIGHT  DISTAL CHEVRON OSTEOTOMY BUNION CORRECTION, POSSIBLE 2ND HAMMERTOE CORRECTION  SURGERY REQUEST TIME: 2 HOURS   CARDIAC CATHETERIZATION     09/06/10: normal coronaries and LV function (saw Dr. Alanda AmassWeintraub in 2011)   COLONOSCOPY     JOINT REPLACEMENT     knee replacement   LIPOSUCTION     LUMBAR FUSION  March 2014   LUMBAR LAMINECTOMY     LUMBAR LAMINECTOMY/DECOMPRESSION MICRODISCECTOMY Bilateral 09/25/2022   Procedure: Right Lumbar two-three, Bilateral Lumbar three-four Laminotomy/Foraminotomy;  Surgeon: Barnett AbuElsner, Henry, MD;  [facility];  Service: Neurosurgery;  Laterality: Bilateral;   PARTIAL HYSTERECTOMY     SHOULDER ARTHROSCOPY WITH ROTATOR CUFF REPAIR AND SUBACROMIAL DECOMPRESSION Right 02/26/2015   Procedure: RIGHT SHOULDER ARTHROSCOPY DEBRIDEMENT WITH SUBACROMIAL DECOMPRESSION, , ROTATOR CUFF REPAIR ;  Surgeon: Salvatore Marvelobert Wainer, MD;  Location: [facility];  Service: Orthopedics;  Laterality: Right;    TOTAL KNEE ARTHROPLASTY  2010   left   TUBAL LIGATION      Current Outpatient Medications  Medication Sig Dispense Refill   acyclovir ointment (ZOVIRAX) 5 % Apply 1 Application topically daily as needed (Blisters).     albuterol (VENTOLIN HFA) 108 (90 Base) MCG/ACT inhaler INHALE 2 PUFFS EVERY 4 HOURS AS NEEDED FOR WHEEZING, COUGH, AND SHORTNESS OF BREATH     atorvastatin (LIPITOR) 10 MG tablet Take 10 mg by mouth daily.     Cholecalciferol (VITAMIN D-3) 125 MCG (5000 UT) TABS Take 5,000 Units by mouth daily.     denosumab (PROLIA) 60 MG/ML SOSY injection Inject 60 mg into the skin every 6 (six) months.     docusate sodium (COLACE) 100 MG capsule Take 100 mg by mouth daily as needed for mild constipation or moderate constipation.     estradiol (ESTRACE) 0.1 MG/GM vaginal cream Place 1 g vaginally daily as needed (UTI).     ezetimibe (ZETIA) 10 MG tablet Take 10 mg by mouth daily.     gabapentin (NEURONTIN) 300 MG capsule Take 1 capsule (300 mg total) by mouth 3 (three) times daily. (Patient taking differently: Take 300 mg by mouth 2 (two) times daily.) 90 capsule 3   hydrochlorothiazide (HYDRODIURIL) 25 MG tablet Take 25 mg by mouth every morning.     ibuprofen (ADVIL) 200 MG tablet Take 400 mg by mouth every 6 (six) hours as needed for moderate pain or mild pain.     methocarbamol (ROBAXIN) 500 MG tablet Take 1 tablet (500 mg total) by mouth every 6 (six) hours as needed for muscle spasms. 30 tablet 3   pantoprazole (PROTONIX) 40 MG tablet Take 1 tablet (40 mg total) by mouth 2 (two) times daily. Office visit for further refills 60 tablet 2   polyethylene glycol (MIRALAX / GLYCOLAX) packet Take 17 g by mouth at bedtime as needed for mild constipation or moderate constipation.     Probiotic Product (PROBIOTIC PO) Take 1 capsule by mouth daily.     telmisartan (MICARDIS) 40 MG tablet Take 40 mg by mouth at bedtime.     No current facility-administered medications for this visit.     Family History  Problem Relation Age of Onset   Tics Father    Lung cancer Father    Breast cancer Mother    Diabetes Mother    Diabetes Paternal Grandmother    Colon cancer Neg Hx    Esophageal cancer Neg Hx    Rectal cancer Neg Hx    Stomach cancer Neg Hx    Colon polyps Neg Hx  Review of Systems  All other systems reviewed and are negative.   Exam:   BP 122/76 (BP Location: Right Arm, Patient Position: Sitting, Cuff Size: Normal)   Ht 5\' 2"  (1.575 m)   Wt 166 lb (75.3 kg)   BMI 30.36 kg/m     General appearance: alert, cooperative and appears stated age Head: normocephalic, without obvious abnormality, atraumatic Neck: no adenopathy, supple, symmetrical, trachea midline and thyroid normal to inspection and palpation Lungs: clear to auscultation bilaterally Breasts: normal appearance, no masses or tenderness, No nipple retraction or dimpling, No nipple discharge or bleeding, No axillary adenopathy Heart: regular rate and rhythm Abdomen: soft, non-tender; no masses, no organomegaly Extremities: extremities normal, atraumatic, no cyanosis or edema Skin: skin color, texture, turgor normal. No rashes or lesions Lymph nodes: cervical, supraclavicular, and axillary nodes normal. Neurologic: grossly normal  Pelvic: External genitalia:  no lesions              No abnormal inguinal nodes palpated.              Urethra:  normal appearing urethra with no masses, tenderness or lesions              Bartholins and Skenes: normal                 Vagina: normal appearing vagina with normal color and discharge, no lesions.  Second degree cystocele.                Cervix: absent              Pap taken: no Bimanual Exam:  Uterus:  absent              Adnexa: no mass, fullness, tenderness              Rectal exam: yes.  Confirms.  First degree rectocele.              Anus:  normal sphincter tone, no lesions  Chaperone was present for exam:  Warren Lacy, CMA  Assessment:   Well  woman visit with gynecologic exam. Status post hysterectomy for endometriosis.  Ovaries remain.  Hx diverticulitis.  Hx PE post op in past.  Cystocele and rectocele.  Stable.  Mood disorder.  Off medication.  Osteoporosis.  On Prolia.  FH breast cancer.    Plan: Mammogram screening discussed. Self breast awareness reviewed. Pap and HR HPV as above. Guidelines for Calcium, Vitamin D, regular exercise program including cardiovascular and weight bearing exercise. BMD through PCP.  Vaginal estrogen cream through urology. I recommended that she use the vaginal estrogen sparingly and not 1 gram daily due to her hx of prior PE.    I gave her a brochure on counseling through [place].  Follow up in 1 - 2 years, patient preference.  After visit summary provided.

## 2023-03-15 ENCOUNTER — Ambulatory Visit (INDEPENDENT_AMBULATORY_CARE_PROVIDER_SITE_OTHER): Payer: 59 | Admitting: Obstetrics and Gynecology

## 2023-03-15 ENCOUNTER — Encounter: Payer: Self-pay | Admitting: Obstetrics and Gynecology

## 2023-03-15 VITALS — BP 122/76 | Ht 62.0 in | Wt 166.0 lb

## 2023-03-15 DIAGNOSIS — Z01419 Encounter for gynecological examination (general) (routine) without abnormal findings: Secondary | ICD-10-CM

## 2023-03-15 NOTE — Patient Instructions (Signed)

## 2023-03-20 ENCOUNTER — Other Ambulatory Visit (HOSPITAL_COMMUNITY): Payer: Self-pay

## 2023-03-21 ENCOUNTER — Encounter (HOSPITAL_COMMUNITY)
Admission: RE | Admit: 2023-03-21 | Discharge: 2023-03-21 | Disposition: A | Discharge status: Home / Self Care | Payer: 59 | Source: Ambulatory Visit | Attending: Internal Medicine | Admitting: Internal Medicine

## 2023-03-21 DIAGNOSIS — M810 Age-related osteoporosis without current pathological fracture: Secondary | ICD-10-CM | POA: Insufficient documentation

## 2023-03-21 MED ORDER — DENOSUMAB 60 MG/ML ~~LOC~~ SOSY
PREFILLED_SYRINGE | SUBCUTANEOUS | Status: AC
  Filled 2023-03-21: qty 1

## 2023-03-21 MED ORDER — DENOSUMAB 60 MG/ML ~~LOC~~ SOSY
60.0000 mg | PREFILLED_SYRINGE | Freq: Once | SUBCUTANEOUS | Status: AC
  Administered 2023-03-21: 60 mg via SUBCUTANEOUS

## 2023-04-03 ENCOUNTER — Encounter (HOSPITAL_COMMUNITY): Payer: Self-pay

## 2023-04-03 NOTE — Progress Notes (Addendum)
PCP - mark Perini Clearance on chart 11-30-22  Cardiologist -   PPM/ICD -  Device Orders -  Rep Notified -   Chest x-ray -  EKG -  Stress Test -  ECHO -  Cardiac Cath -   Sleep Study -  CPAP -   Fasting Blood Sugar -  Checks Blood Sugar _____ times a day  Blood Thinner Instructions: Aspirin Instructions:  ERAS Protcol - PRE-SURGERY Ensure or G2-   COVID TEST-  COVID vaccine -  Activity-- Anesthesia review:   Patient denies shortness of breath, fever, cough and chest pain at PAT appointment   All instructions explained to the patient, with a verbal understanding of the material. Patient agrees to go over the instructions while at home for a better understanding. Patient also instructed to self quarantine after being tested for COVID-19. The opportunity to ask questions was provided.

## 2023-04-03 NOTE — Patient Instructions (Signed)
SURGICAL WAITING ROOM VISITATION  Patients having surgery or a procedure may have no more than 2 support people in the waiting area - these visitors may rotate.    Children under the age of 31 must have an adult with them who is not the patient.  If the patient needs to stay at the hospital during part of their recovery, the visitor guidelines for inpatient rooms apply. Pre-op nurse will coordinate an appropriate time for 1 support person to accompany patient in pre-op.  This support person may not rotate.    Please refer to the [facility] website for the visitor guidelines for Inpatients (after your surgery is over and you are in a regular room).       Your procedure is scheduled on: 04-17-23   Report to [facility] Main Entrance    Report to admitting at      0800 AM   Call this number if you have problems the morning of surgery [phone]   Do not eat food :After Midnight.   After Midnight you may have the following liquids until _0730_____ AM/ DAY OF   surgery then nothing by mouth  Water Non-Citrus Juices (without pulp, NO RED-Apple, White grape, White cranberry) Black Coffee (NO MILK/CREAM OR CREAMERS, sugar ok)  Clear Tea (NO MILK/CREAM OR CREAMERS, sugar ok) regular and decaf                             Plain Jell-O (NO RED)                                           Fruit ices (not with fruit pulp, NO RED)                                     Popsicles (NO RED)                                                               Sports drinks like Gatorade (NO RED)                    The day of surgery:  Drink ONE (1) Pre-Surgery Clear Ensure  at      0715AM the morning of surgery. Drink in one sitting. Do not sip.  This drink was given to you during your hospital  pre-op appointment visit. Nothing else to drink after completing the  Pre-Surgery Clear Ensure by 0730 am.          If you have questions, please contact your surgeon's office.   FOLLOW BOWEL PREP  AND ANY ADDITIONAL PRE OP INSTRUCTIONS YOU RECEIVED FROM YOUR SURGEON'S OFFICE!!!     Oral Hygiene is also important to reduce your risk of infection.                                    Remember - BRUSH YOUR TEETH THE MORNING OF SURGERY WITH YOUR REGULAR TOOTHPASTE  DENTURES WILL BE  REMOVED PRIOR TO SURGERY PLEASE DO NOT APPLY "Poly grip" OR ADHESIVES!!!   Do NOT smoke after Midnight   Take these medicines the morning of surgery with A SIP OF WATER:   DO NOT TAKE ANY ORAL DIABETIC MEDICATIONS DAY OF YOUR SURGERY  Bring CPAP mask and tubing day of surgery.                              You may not have any metal on your body including hair pins, jewelry, and body piercing             Do not wear make-up, lotions, powders, perfumes/cologne, or deodorant  Do not wear nail polish including gel and S&S, artificial/acrylic nails, or any other type of covering on natural nails including finger and toenails. If you have artificial nails, gel coating, etc. that needs to be removed by a nail salon please have this removed prior to surgery or surgery may need to be canceled/ delayed if the surgeon/ anesthesia feels like they are unable to be safely monitored.   Do not shave  48 hours prior to surgery.               Men may shave face and neck.   Do not bring valuables to the hospital. [facility] IS NOT             RESPONSIBLE   FOR VALUABLES.   Contacts, glasses, dentures or bridgework may not be worn into surgery.   Bring small overnight bag day of surgery.   DO NOT BRING YOUR HOME MEDICATIONS TO THE HOSPITAL. PHARMACY WILL DISPENSE MEDICATIONS LISTED ON YOUR MEDICATION LIST TO YOU DURING YOUR ADMISSION IN THE HOSPITAL!    Patients discharged on the day of surgery will not be allowed to drive home.  Someone NEEDS to stay with you for the first 24 hours after anesthesia.   Special Instructions: Bring a copy of your healthcare power of attorney and living will documents the day of surgery  if you haven't scanned them before.              Please read over the following fact sheets you were given: IF YOU HAVE QUESTIONS ABOUT YOUR PRE-OP INSTRUCTIONS PLEASE CALL [phone]   If you received a COVID test during your pre-op visit  it is requested that you wear a mask when out in public, stay away from anyone that may not be feeling well and notify your surgeon if you develop symptoms. If you test positive for Covid or have been in contact with anyone that has tested positive in the last 10 days please notify you surgeon.      Pre-operative 5 CHG Bath Instructions   You can play a key role in reducing the risk of infection after surgery. Your skin needs to be as free of germs as possible. You can reduce the number of germs on your skin by washing with CHG (chlorhexidine gluconate) soap before surgery. CHG is an antiseptic soap that kills germs and continues to kill germs even after washing.   DO NOT use if you have an allergy to chlorhexidine/CHG or antibacterial soaps. If your skin becomes reddened or irritated, stop using the CHG and notify one of our RNs at [phone].   Please shower with the CHG soap starting 4 days before surgery using the following schedule:     Please keep in mind the following:  DO NOT  shave, including legs and underarms, starting the day of your first shower.   You may shave your face at any point before/day of surgery.  Place clean sheets on your bed the day you start using CHG soap. Use a clean washcloth (not used since being washed) for each shower. DO NOT sleep with pets once you start using the CHG.   CHG Shower Instructions:  If you choose to wash your hair and private area, wash first with your normal shampoo/soap.  After you use shampoo/soap, rinse your hair and body thoroughly to remove shampoo/soap residue.  Turn the water OFF and apply about 3 tablespoons (45 ml) of CHG soap to a CLEAN washcloth.  Apply CHG soap ONLY FROM YOUR NECK DOWN  TO YOUR TOES (washing for 3-5 minutes)  DO NOT use CHG soap on face, private areas, open wounds, or sores.  Pay special attention to the area where your surgery is being performed.  If you are having back surgery, having someone wash your back for you may be helpful. Wait 2 minutes after CHG soap is applied, then you may rinse off the CHG soap.  Pat dry with a clean towel  Put on clean clothes/pajamas   If you choose to wear lotion, please use ONLY the CHG-compatible lotions on the back of this paper.     Additional instructions for the day of surgery: DO NOT APPLY any lotions, deodorants, cologne, or perfumes.   Put on clean/comfortable clothes.  Brush your teeth.  Ask your nurse before applying any prescription medications to the skin.      CHG Compatible Lotions   Aveeno Moisturizing lotion  Cetaphil Moisturizing Cream  Cetaphil Moisturizing Lotion  Clairol Herbal Essence Moisturizing Lotion, Dry Skin  Clairol Herbal Essence Moisturizing Lotion, Extra Dry Skin  Clairol Herbal Essence Moisturizing Lotion, Normal Skin  Curel Age Defying Therapeutic Moisturizing Lotion with Alpha Hydroxy  Curel Extreme Care Body Lotion  Curel Soothing Hands Moisturizing Hand Lotion  Curel Therapeutic Moisturizing Cream, Fragrance-Free  Curel Therapeutic Moisturizing Lotion, Fragrance-Free  Curel Therapeutic Moisturizing Lotion, Original Formula  Eucerin Daily Replenishing Lotion  Eucerin Dry Skin Therapy Plus Alpha Hydroxy Crme  Eucerin Dry Skin Therapy Plus Alpha Hydroxy Lotion  Eucerin Original Crme  Eucerin Original Lotion  Eucerin Plus Crme Eucerin Plus Lotion  Eucerin TriLipid Replenishing Lotion  Keri Anti-Bacterial Hand Lotion  Keri Deep Conditioning Original Lotion Dry Skin Formula Softly Scented  Keri Deep Conditioning Original Lotion, Fragrance Free Sensitive Skin Formula  Keri Lotion Fast Absorbing Fragrance Free Sensitive Skin Formula  Keri Lotion Fast Absorbing Softly  Scented Dry Skin Formula  Keri Original Lotion  Keri Skin Renewal Lotion Keri Silky Smooth Lotion  Keri Silky Smooth Sensitive Skin Lotion  Nivea Body Creamy Conditioning Oil  Nivea Body Extra Enriched Lotion  Nivea Body Original Lotion  Nivea Body Sheer Moisturizing Lotion Nivea Crme  Nivea Skin Firming Lotion  NutraDerm 30 Skin Lotion  NutraDerm Skin Lotion  NutraDerm Therapeutic Skin Cream  NutraDerm Therapeutic Skin Lotion  ProShield Protective Hand Cream  Provon moisturizing lotion    Incentive Spirometer  An incentive spirometer is a tool that can help keep your lungs clear and active. This tool measures how well you are filling your lungs with each breath. Taking long deep breaths may help reverse or decrease the chance of developing breathing (pulmonary) problems (especially infection) following: A long period of time when you are unable to move or be active. BEFORE THE PROCEDURE  If the spirometer  includes an indicator to show your best effort, your nurse or respiratory therapist will set it to a desired goal. If possible, sit up straight or lean slightly forward. Try not to slouch. Hold the incentive spirometer in an upright position. INSTRUCTIONS FOR USE  Sit on the edge of your bed if possible, or sit up as far as you can in bed or on a chair. Hold the incentive spirometer in an upright position. Breathe out normally. Place the mouthpiece in your mouth and seal your lips tightly around it. Breathe in slowly and as deeply as possible, raising the piston or the ball toward the top of the column. Hold your breath for 3-5 seconds or for as long as possible. Allow the piston or ball to fall to the bottom of the column. Remove the mouthpiece from your mouth and breathe out normally. Rest for a few seconds and repeat Steps 1 through 7 at least 10 times every 1-2 hours when you are awake. Take your time and take a few normal breaths between deep breaths. The spirometer may  include an indicator to show your best effort. Use the indicator as a goal to work toward during each repetition. After each set of 10 deep breaths, practice coughing to be sure your lungs are clear. If you have an incision (the cut made at the time of surgery), support your incision when coughing by placing a pillow or rolled up towels firmly against it. Once you are able to get out of bed, walk around indoors and cough well. You may stop using the incentive spirometer when instructed by your caregiver.  RISKS AND COMPLICATIONS Take your time so you do not get dizzy or light-headed. If you are in pain, you may need to take or ask for pain medication before doing incentive spirometry. It is harder to take a deep breath if you are having pain. AFTER USE Rest and breathe slowly and easily. It can be helpful to keep track of a log of your progress. Your caregiver can provide you with a simple table to help with this. If you are using the spirometer at home, follow these instructions: SEEK MEDICAL CARE IF:  You are having difficultly using the spirometer. You have trouble using the spirometer as often as instructed. Your pain medication is not giving enough relief while using the spirometer. You develop fever of 100.5 F (38.1 C) or higher. SEEK IMMEDIATE MEDICAL CARE IF:  You cough up bloody sputum that had not been present before. You develop fever of 102 F (38.9 C) or greater. You develop worsening pain at or near the incision site. MAKE SURE YOU:  Understand these instructions. Will watch your condition. Will get help right away if you are not doing well or get worse. Document Released: 03/19/2007 Document Revised: 01/29/2012 Document Reviewed: 05/20/2007 [facility] Patient Information 2014 [place], [place].   ________________________________________________________________________

## 2023-04-05 ENCOUNTER — Other Ambulatory Visit: Payer: Self-pay

## 2023-04-05 ENCOUNTER — Encounter (HOSPITAL_COMMUNITY)
Admission: RE | Admit: 2023-04-05 | Discharge: 2023-04-05 | Disposition: A | Discharge status: Home / Self Care | Payer: 59 | Source: Ambulatory Visit | Attending: Orthopedic Surgery | Admitting: Orthopedic Surgery

## 2023-04-05 ENCOUNTER — Encounter (HOSPITAL_COMMUNITY): Payer: Self-pay

## 2023-04-05 VITALS — BP 138/88 | HR 70 | Temp 98.0°F | Resp 17 | Ht 62.5 in | Wt 172.0 lb

## 2023-04-05 DIAGNOSIS — Z01818 Encounter for other preprocedural examination: Secondary | ICD-10-CM

## 2023-04-05 DIAGNOSIS — I10 Essential (primary) hypertension: Secondary | ICD-10-CM | POA: Insufficient documentation

## 2023-04-05 DIAGNOSIS — Z01812 Encounter for preprocedural laboratory examination: Secondary | ICD-10-CM | POA: Diagnosis present

## 2023-04-05 LAB — BASIC METABOLIC PANEL
Anion gap: 6 (ref 5–15)
BUN: 18 mg/dL (ref 8–23)
CO2: 24 mmol/L (ref 22–32)
Calcium: 9.1 mg/dL (ref 8.9–10.3)
Chloride: 110 mmol/L (ref 98–111)
Creatinine, Ser: 0.73 mg/dL (ref 0.44–1.00)
GFR, Estimated: >60 mL/min (ref >60)
Glucose, Bld: 94 mg/dL (ref 70–99)
Potassium: 4.3 mmol/L (ref 3.5–5.1)
Sodium: 140 mmol/L (ref 135–145)

## 2023-04-05 LAB — CBC
HCT: 40.8 % (ref 36.0–46.0)
Hemoglobin: 12.8 g/dL (ref 12.0–15.0)
MCH: 28.9 pg (ref 26.0–34.0)
MCHC: 31.4 g/dL (ref 30.0–36.0)
MCV: 92.1 fL (ref 80.0–100.0)
Platelets: 221 10*3/uL (ref 150–400)
RBC: 4.43 MIL/uL (ref 3.87–5.11)
RDW: 15.5 % (ref 11.5–15.5)
WBC: 5.4 10*3/uL (ref 4.0–10.5)
nRBC: 0 % (ref 0.0–0.2)

## 2023-04-05 LAB — SURGICAL PCR SCREEN
MRSA, PCR: NEGATIVE
Staphylococcus aureus: NEGATIVE

## 2023-04-11 NOTE — H&P (Signed)
HIP ARTHROPLASTY ADMISSION H&P  Patient ID: Lisa Moody MRN: [id number] DOB/AGE: 76-Apr-1947 76 y.o.  Chief Complaint: right hip pain.  Planned Procedure Date: 04/17/23 Medical Clearance by Rodrigo Ran    Additional clearance by Dr. Arita Miss (urology)  HPI: Lisa Moody is a 77 y.o. 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 The patient has a history of pain and functional disability in the right hip due to arthritis and has failed non-surgical conservative treatments for greater than 12 weeks to include NSAID's and/or analgesics, corticosteriod injections, and activity modification.  Onset of symptoms was gradual, starting 1 years ago with rapidlly worsening course since that time. The patient noted no past surgery on the right hip.  Patient currently rates pain at 6 out of 10 with activity. Patient has worsening of pain with activity and weight bearing and pain that interferes with activities of daily living.  Patient has evidence of joint space narrowing by imaging studies.  There is no active infection.  Past Medical History:  Diagnosis Date   Anemia    years ago per pt   Arthritis    Asthma    seasonal   Back pain    Benign neoplasm of colon    Complication of anesthesia    anectine - caused her to have difficulty waking up and was on vent for few hours post op   Complication of anesthesia    pulmonary embolism 2006 -pt states it was determined that it was due to having the liposuction   COVID 2021   mild case   Diverticulitis    Diverticulosis of colon (without mention of hemorrhage)    Endometriosis    reason for hysterectomy.  ovaries remain.    Esophageal reflux    few times per month not routine   Esophagitis, unspecified    Headache(784.0)    use to have migraines - no longer have them   Hyperlipidemia    Hypertension    Osteopenia    Osteopenia    Osteoporosis    Other and unspecified hyperlipidemia    Other and unspecified hyperlipidemia    Other  constipation    Palpitations    Personal history of other mental disorder    Pneumonia    PONV (postoperative nausea and vomiting)    Pulmonary embolism (HCC)    2006   Sleep apnea    no cpap, did weight loss   Past Surgical History:  Procedure Laterality Date   ABDOMINAL HYSTERECTOMY     ABDOMINOPLASTY     APPENDECTOMY     BUNIONECTOMY WITH WEIL OSTEOTOMY Right 03/09/2020   Procedure: RIGHT DISTAL CHEVRON OSTEOTOMY BUNION CORRECTION;  Surgeon: Terance Hart, MD;  Location: [facility];  Service: Orthopedics;  Laterality: Right;  PROCEDURE: RIGHT DISTAL CHEVRON OSTEOTOMY BUNION CORRECTION, POSSIBLE 2ND HAMMERTOE CORRECTION  SURGERY REQUEST TIME: 2 HOURS   CARDIAC CATHETERIZATION     09/06/10: normal coronaries and LV function (saw Dr. Alanda Amass in 2011)   COLONOSCOPY     JOINT REPLACEMENT     knee replacement   LIPOSUCTION     LUMBAR FUSION  March 2014   LUMBAR LAMINECTOMY     LUMBAR LAMINECTOMY/DECOMPRESSION MICRODISCECTOMY Bilateral 09/25/2022   Procedure: Right Lumbar two-three, Bilateral Lumbar three-four Laminotomy/Foraminotomy;  Surgeon: Barnett Abu, MD;  [facility];  Service: Neurosurgery;  Laterality: Bilateral;   PARTIAL HYSTERECTOMY     SHOULDER ARTHROSCOPY WITH ROTATOR CUFF REPAIR AND SUBACROMIAL DECOMPRESSION Right 02/26/2015   Procedure:  RIGHT SHOULDER ARTHROSCOPY DEBRIDEMENT WITH SUBACROMIAL DECOMPRESSION, , ROTATOR CUFF REPAIR ;  Surgeon: Salvatore Marvel, MD;  Location: [facility];  Service: Orthopedics;  Laterality: Right;   TOTAL KNEE ARTHROPLASTY  2010   left   TUBAL LIGATION     Allergies  Allergen Reactions   Anectine  [Succinylcholine Chloride] Anaphylaxis   Succinylcholine     Respiratory suppression    Multi Vitamin-Fluoride [Multivitamins-Fluoride] Nausea Only    iron   Prior to Admission medications   Medication Sig Start Date End Date Taking? Authorizing Provider  acyclovir ointment (ZOVIRAX) 5 % Apply 1  Application topically daily as needed (Blisters). 05/29/22  Yes [provider]  albuterol (VENTOLIN HFA) 108 (90 Base) MCG/ACT inhaler Inhale 2 puffs into the lungs every 6 (six) hours as needed for shortness of breath or wheezing. 04/07/19  Yes [provider]  atorvastatin (LIPITOR) 10 MG tablet Take 10 mg by mouth daily.   Yes [provider]  Cholecalciferol (VITAMIN D-3) 125 MCG (5000 UT) TABS Take 5,000 Units by mouth daily.   Yes [provider]  denosumab (PROLIA) 60 MG/ML SOSY injection Inject 60 mg into the skin every 6 (six) months.   Yes [provider]  docusate sodium (COLACE) 100 MG capsule Take 100 mg by mouth daily as needed for mild constipation or moderate constipation.   Yes [provider]  estradiol (ESTRACE) 0.1 MG/GM vaginal cream Place 1 g vaginally 2 (two) times a week. 08/08/22  Yes [provider]  ezetimibe (ZETIA) 10 MG tablet Take 10 mg by mouth daily. 01/10/17  Yes [provider]  gabapentin (NEURONTIN) 300 MG capsule Take 1 capsule (300 mg total) by mouth 3 (three) times daily. Patient taking differently: Take 300 mg by mouth 2 (two) times daily. 09/26/22  Yes Barnett Abu, MD  hydrochlorothiazide (HYDRODIURIL) 25 MG tablet Take 25 mg by mouth every morning. 06/24/22  Yes [provider]  ibuprofen (ADVIL) 200 MG tablet Take 400 mg by mouth every 6 (six) hours as needed for moderate pain or mild pain.   Yes [provider]  pantoprazole (PROTONIX) 40 MG tablet Take 1 tablet (40 mg total) by mouth 2 (two) times daily. Office visit for further refills Patient taking differently: Take 40 mg by mouth daily. Office visit for further refills 05/18/22  Yes Hilarie Fredrickson, MD  polyethylene glycol [facility] / GLYCOLAX) packet Take 17 g by mouth at bedtime.   Yes [provider]  Probiotic Product (PROBIOTIC PO) Take 1 capsule by mouth daily.   Yes [provider]  telmisartan  (MICARDIS) 40 MG tablet Take 40 mg by mouth at bedtime. 09/11/22  Yes [provider]  methocarbamol (ROBAXIN) 500 MG tablet Take 1 tablet (500 mg total) by mouth every 6 (six) hours as needed for muscle spasms. Patient not taking: Reported on 04/04/2023 09/26/22   Barnett Abu, MD   Social History   Socioeconomic History   Marital status: Married    Spouse name: Not on file   Number of children: 3   Years of education: college   Highest education level: Not on file  Occupational History   Occupation: retired    Associate Professor: UNEMPLOYED  Tobacco Use   Smoking status: Never    Passive exposure: Past   Smokeless tobacco: Never  Vaping Use   Vaping Use: Never used  Substance and Sexual Activity   Alcohol use: Yes    Comment: socially   Drug use: No  Sexual activity: Not Currently    Partners: Male    Birth control/protection: Surgical    Comment: TBL/TAH  Other Topics Concern   Not on file  Social History Narrative   Drinks 2 cup coffee a day    Social Determinants of Health   Financial Resource Strain: Not on file  Food Insecurity: Not on file  Transportation Needs: Not on file  Physical Activity: Not on file  Stress: Not on file  Social Connections: Not on file   Family History  Problem Relation Age of Onset   Tics Father    Lung cancer Father    Breast cancer Mother    Diabetes Mother    Diabetes Paternal Grandmother    Colon cancer Neg Hx    Esophageal cancer Neg Hx    Rectal cancer Neg Hx    Stomach cancer Neg Hx    Colon polyps Neg Hx     ROS: Currently denies lightheadedness, dizziness, Fever, chills, CP, SOB.   No personal history of DVT, PE, MI, or CVA. No loose teeth or dentures All other systems have been reviewed and were otherwise currently negative with the exception of those mentioned in the HPI and as above.  Objective: Vitals: HT: 5\' 1"   WT:  163  T: 97.6  BP 127/81  P: 88  O2 SAT: 94% on room air.  Physical Exam: General: Alert,  NAD. Trendelenberg Gait  HEENT: EOMI, Good Neck Extension  Pulm: No increased work of breathing.  Clear B/L A/P w/o crackle or wheeze.  CV: RRR, No m/g/r appreciated  GI: soft, NT, ND Neuro: Neuro without gross focal deficit.  Sensation intact distally Skin: No lesions in the area of chief complaint MSK/Surgical Site: She has  0-90 degrees of forward flexion at the right hip. Small pain created with passive and active internal and external rotation. EHL and FHL intact. Distal sensation intact. There is a 2+ PT pulse.   Imaging Review Plain radiographs demonstrate degenerative joint disease of the right hip.   The bone quality appears to be adequate for age and reported activity level.  Preoperative templating of the joint replacement has been completed, documented, and submitted to the Operating Room personnel in order to optimize intra-operative equipment management.  Assessment: DJD right hip Active Problems:   * No active hospital problems. *   Plan: Plan for Procedure(s): TOTAL HIP ARTHROPLASTY  The patient history, physical exam, clinical judgement of the provider and imaging are consistent with end stage degenerative joint disease and total joint arthroplasty is deemed medically necessary. The treatment options including medical management, injection therapy, and arthroplasty were discussed at length. The risks and benefits of Procedure(s): TOTAL HIP ARTHROPLASTY were presented and reviewed.  The risks of nonoperative treatment, versus surgical intervention including but not limited to continued pain, aseptic loosening, stiffness, dislocation/subluxation, infection, bleeding, nerve injury, blood clots, cardiopulmonary complications, morbidity, mortality, among others were discussed. The patient verbalizes understanding and wishes to proceed with the plan.  Patient is being admitted for surgery, pain control, PT, prophylactic antibiotics, VTE prophylaxis, progressive ambulation,  ADL's and discharge planning.   The patient does not meet the criteria for TXA which will be used perioperatively.   Eliquis  will be used postoperatively for DVT prophylaxis in addition to SCDs, and early ambulation. The patient is planning to be discharged home with OPPT in care of husband   Armida Sans, Cordelia Poche 04/11/2023 10:15 AM

## 2023-04-17 ENCOUNTER — Other Ambulatory Visit: Payer: Self-pay

## 2023-04-17 ENCOUNTER — Observation Stay (HOSPITAL_COMMUNITY)
Admission: RE | Admit: 2023-04-17 | Discharge: 2023-04-18 | Disposition: A | Discharge status: Home / Self Care | Payer: 59 | Attending: Orthopedic Surgery | Admitting: Orthopedic Surgery

## 2023-04-17 ENCOUNTER — Ambulatory Visit (HOSPITAL_COMMUNITY): Payer: 59 | Admitting: Anesthesiology

## 2023-04-17 ENCOUNTER — Ambulatory Visit (HOSPITAL_BASED_OUTPATIENT_CLINIC_OR_DEPARTMENT_OTHER): Payer: 59 | Admitting: Anesthesiology

## 2023-04-17 ENCOUNTER — Ambulatory Visit (HOSPITAL_COMMUNITY): Payer: 59

## 2023-04-17 ENCOUNTER — Encounter (HOSPITAL_COMMUNITY): Payer: Self-pay | Admitting: Orthopedic Surgery

## 2023-04-17 ENCOUNTER — Encounter (HOSPITAL_COMMUNITY)
Admission: RE | Disposition: A | Discharge status: Home / Self Care | Payer: Self-pay | Source: Home / Self Care | Attending: Orthopedic Surgery

## 2023-04-17 DIAGNOSIS — J45909 Unspecified asthma, uncomplicated: Secondary | ICD-10-CM | POA: Diagnosis not present

## 2023-04-17 DIAGNOSIS — Z96652 Presence of left artificial knee joint: Secondary | ICD-10-CM | POA: Diagnosis not present

## 2023-04-17 DIAGNOSIS — Z79899 Other long term (current) drug therapy: Secondary | ICD-10-CM | POA: Diagnosis not present

## 2023-04-17 DIAGNOSIS — Z8616 Personal history of COVID-19: Secondary | ICD-10-CM | POA: Diagnosis not present

## 2023-04-17 DIAGNOSIS — M1611 Unilateral primary osteoarthritis, right hip: Secondary | ICD-10-CM | POA: Diagnosis present

## 2023-04-17 DIAGNOSIS — G4730 Sleep apnea, unspecified: Secondary | ICD-10-CM

## 2023-04-17 DIAGNOSIS — I10 Essential (primary) hypertension: Secondary | ICD-10-CM | POA: Diagnosis not present

## 2023-04-17 DIAGNOSIS — Z86711 Personal history of pulmonary embolism: Secondary | ICD-10-CM | POA: Insufficient documentation

## 2023-04-17 DIAGNOSIS — Z96649 Presence of unspecified artificial hip joint: Secondary | ICD-10-CM

## 2023-04-17 HISTORY — PX: TOTAL HIP ARTHROPLASTY: SHX124

## 2023-04-17 SURGERY — ARTHROPLASTY, HIP, TOTAL,POSTERIOR APPROACH
Anesthesia: Monitor Anesthesia Care | Site: Hip | Laterality: Right

## 2023-04-17 MED ORDER — IRBESARTAN 150 MG PO TABS
150.0000 mg | ORAL_TABLET | Freq: Every day | ORAL | Status: DC
  Administered 2023-04-17: 150 mg via ORAL
  Filled 2023-04-17: qty 1

## 2023-04-17 MED ORDER — DEXAMETHASONE SODIUM PHOSPHATE 10 MG/ML IJ SOLN
10.0000 mg | Freq: Once | INTRAMUSCULAR | Status: AC
  Administered 2023-04-18: 10 mg via INTRAVENOUS
  Filled 2023-04-17: qty 1

## 2023-04-17 MED ORDER — STERILE WATER FOR IRRIGATION IR SOLN
Status: DC | PRN
  Administered 2023-04-17: 2000 mL

## 2023-04-17 MED ORDER — PROPOFOL 500 MG/50ML IV EMUL
INTRAVENOUS | Status: DC | PRN
  Administered 2023-04-17: 25 ug/kg/min via INTRAVENOUS

## 2023-04-17 MED ORDER — PROMETHAZINE HCL 25 MG/ML IJ SOLN: 6.2500 mg | INTRAMUSCULAR | Status: DC | PRN

## 2023-04-17 MED ORDER — CEFAZOLIN SODIUM-DEXTROSE 2-4 GM/100ML-% IV SOLN
2.0000 g | INTRAVENOUS | Status: AC
  Administered 2023-04-17: 2 g via INTRAVENOUS
  Filled 2023-04-17: qty 100

## 2023-04-17 MED ORDER — ALBUTEROL SULFATE HFA 108 (90 BASE) MCG/ACT IN AERS: 2.0000 | INHALATION_SPRAY | Freq: Four times a day (QID) | RESPIRATORY_TRACT | Status: DC | PRN

## 2023-04-17 MED ORDER — DIPHENHYDRAMINE HCL 12.5 MG/5ML PO ELIX
12.5000 mg | ORAL_SOLUTION | ORAL | Status: DC | PRN
  Filled 2023-04-17: qty 10

## 2023-04-17 MED ORDER — LACTATED RINGERS IV BOLUS: 250.0000 mL | Freq: Once | INTRAVENOUS | Status: DC

## 2023-04-17 MED ORDER — PROPOFOL 10 MG/ML IV BOLUS
INTRAVENOUS | Status: DC | PRN
  Administered 2023-04-17 (×3): 20 mg via INTRAVENOUS

## 2023-04-17 MED ORDER — KETOROLAC TROMETHAMINE 30 MG/ML IJ SOLN
INTRAMUSCULAR | Status: AC
  Filled 2023-04-17: qty 1

## 2023-04-17 MED ORDER — LACTATED RINGERS IV BOLUS: 500.0000 mL | Freq: Once | INTRAVENOUS | Status: DC

## 2023-04-17 MED ORDER — ONDANSETRON HCL 4 MG/2ML IJ SOLN
INTRAMUSCULAR | Status: DC | PRN
  Administered 2023-04-17: 4 mg via INTRAVENOUS

## 2023-04-17 MED ORDER — ATORVASTATIN CALCIUM 10 MG PO TABS
10.0000 mg | ORAL_TABLET | Freq: Every day | ORAL | Status: DC
  Filled 2023-04-17: qty 1

## 2023-04-17 MED ORDER — PHENYLEPHRINE HCL-NACL 20-0.9 MG/250ML-% IV SOLN
INTRAVENOUS | Status: DC | PRN
  Administered 2023-04-17: 40 ug/min via INTRAVENOUS

## 2023-04-17 MED ORDER — PROPOFOL 1000 MG/100ML IV EMUL
INTRAVENOUS | Status: AC
  Filled 2023-04-17: qty 100

## 2023-04-17 MED ORDER — EZETIMIBE 10 MG PO TABS
10.0000 mg | ORAL_TABLET | Freq: Every day | ORAL | Status: DC
  Filled 2023-04-17: qty 1

## 2023-04-17 MED ORDER — 0.9 % SODIUM CHLORIDE (POUR BTL) OPTIME
TOPICAL | Status: DC | PRN
  Administered 2023-04-17: 1000 mL

## 2023-04-17 MED ORDER — METOCLOPRAMIDE HCL 5 MG PO TABS: 5.0000 mg | ORAL_TABLET | Freq: Three times a day (TID) | ORAL | Status: DC | PRN

## 2023-04-17 MED ORDER — DOCUSATE SODIUM 100 MG PO CAPS
100.0000 mg | ORAL_CAPSULE | Freq: Two times a day (BID) | ORAL | Status: DC
  Administered 2023-04-17 – 2023-04-18 (×2): 100 mg via ORAL
  Filled 2023-04-17 (×2): qty 1

## 2023-04-17 MED ORDER — KETOROLAC TROMETHAMINE 30 MG/ML IJ SOLN
INTRAMUSCULAR | Status: DC | PRN
  Administered 2023-04-17: 30 mg

## 2023-04-17 MED ORDER — BUPIVACAINE HCL (PF) 0.25 % IJ SOLN
INTRAMUSCULAR | Status: DC | PRN
  Administered 2023-04-17: 30 mL

## 2023-04-17 MED ORDER — OXYCODONE HCL 5 MG PO TABS: 10.0000 mg | ORAL_TABLET | ORAL | Status: DC | PRN

## 2023-04-17 MED ORDER — HYDROCHLOROTHIAZIDE 25 MG PO TABS
25.0000 mg | ORAL_TABLET | Freq: Every day | ORAL | Status: DC
  Administered 2023-04-18: 25 mg via ORAL
  Filled 2023-04-17: qty 1

## 2023-04-17 MED ORDER — POTASSIUM CHLORIDE IN NACL 20-0.9 MEQ/L-% IV SOLN
INTRAVENOUS | Status: DC
  Filled 2023-04-17 (×3): qty 1000

## 2023-04-17 MED ORDER — ONDANSETRON HCL 4 MG/2ML IJ SOLN: 4.0000 mg | Freq: Four times a day (QID) | INTRAMUSCULAR | Status: DC | PRN

## 2023-04-17 MED ORDER — ONDANSETRON HCL 4 MG PO TABS: 4.0000 mg | ORAL_TABLET | Freq: Three times a day (TID) | ORAL | 0 refills | Status: AC | PRN

## 2023-04-17 MED ORDER — PHENOL 1.4 % MT LIQD: 1.0000 | OROMUCOSAL | Status: DC | PRN

## 2023-04-17 MED ORDER — POVIDONE-IODINE 7.5 % EX SOLN: Freq: Once | CUTANEOUS | Status: DC

## 2023-04-17 MED ORDER — CHLORHEXIDINE GLUCONATE 0.12 % MT SOLN
15.0000 mL | Freq: Once | OROMUCOSAL | Status: AC
  Administered 2023-04-17: 15 mL via OROMUCOSAL

## 2023-04-17 MED ORDER — ORAL CARE MOUTH RINSE: 15.0000 mL | Freq: Once | OROMUCOSAL | Status: AC

## 2023-04-17 MED ORDER — HYDROMORPHONE HCL 1 MG/ML IJ SOLN: 0.5000 mg | INTRAMUSCULAR | Status: DC | PRN

## 2023-04-17 MED ORDER — APIXABAN 2.5 MG PO TABS: 2.5000 mg | ORAL_TABLET | Freq: Two times a day (BID) | ORAL | 0 refills | Status: AC

## 2023-04-17 MED ORDER — ALUM & MAG HYDROXIDE-SIMETH 200-200-20 MG/5ML PO SUSP: 30.0000 mL | ORAL | Status: DC | PRN

## 2023-04-17 MED ORDER — ONDANSETRON HCL 4 MG PO TABS: 4.0000 mg | ORAL_TABLET | Freq: Four times a day (QID) | ORAL | Status: DC | PRN

## 2023-04-17 MED ORDER — OXYCODONE HCL 5 MG PO TABS: 5.0000 mg | ORAL_TABLET | ORAL | 0 refills | Status: AC | PRN

## 2023-04-17 MED ORDER — SENNA-DOCUSATE SODIUM 8.6-50 MG PO TABS: 2.0000 | ORAL_TABLET | Freq: Every day | ORAL | 1 refills | Status: AC

## 2023-04-17 MED ORDER — PHENYLEPHRINE 80 MCG/ML (10ML) SYRINGE FOR IV PUSH (FOR BLOOD PRESSURE SUPPORT)
PREFILLED_SYRINGE | INTRAVENOUS | Status: DC | PRN
  Administered 2023-04-17: 80 ug via INTRAVENOUS
  Administered 2023-04-17: 40 ug via INTRAVENOUS
  Administered 2023-04-17 (×4): 80 ug via INTRAVENOUS

## 2023-04-17 MED ORDER — POVIDONE-IODINE 10 % EX SWAB: 2.0000 | Freq: Once | CUTANEOUS | Status: DC

## 2023-04-17 MED ORDER — MENTHOL 3 MG MT LOZG: 1.0000 | LOZENGE | OROMUCOSAL | Status: DC | PRN

## 2023-04-17 MED ORDER — ACETAMINOPHEN 500 MG PO TABS
1000.0000 mg | ORAL_TABLET | Freq: Once | ORAL | Status: AC
  Administered 2023-04-17: 1000 mg via ORAL
  Filled 2023-04-17: qty 2

## 2023-04-17 MED ORDER — APIXABAN 2.5 MG PO TABS
2.5000 mg | ORAL_TABLET | Freq: Two times a day (BID) | ORAL | Status: DC
  Administered 2023-04-18: 2.5 mg via ORAL
  Filled 2023-04-17: qty 1

## 2023-04-17 MED ORDER — CEFAZOLIN SODIUM-DEXTROSE 1-4 GM/50ML-% IV SOLN
1.0000 g | Freq: Four times a day (QID) | INTRAVENOUS | Status: AC
  Administered 2023-04-17 – 2023-04-18 (×2): 1 g via INTRAVENOUS
  Filled 2023-04-17 (×2): qty 50

## 2023-04-17 MED ORDER — METHOCARBAMOL 1000 MG/10ML IJ SOLN: 500.0000 mg | Freq: Four times a day (QID) | INTRAVENOUS | Status: DC | PRN

## 2023-04-17 MED ORDER — OXYCODONE HCL 5 MG PO TABS: 5.0000 mg | ORAL_TABLET | Freq: Once | ORAL | Status: DC | PRN

## 2023-04-17 MED ORDER — OXYCODONE HCL 5 MG/5ML PO SOLN: 5.0000 mg | Freq: Once | ORAL | Status: DC | PRN

## 2023-04-17 MED ORDER — POLYETHYLENE GLYCOL 3350 17 G PO PACK: 17.0000 g | PACK | Freq: Every day | ORAL | Status: DC | PRN

## 2023-04-17 MED ORDER — BUPIVACAINE HCL (PF) 0.25 % IJ SOLN
INTRAMUSCULAR | Status: AC
  Filled 2023-04-17: qty 30

## 2023-04-17 MED ORDER — LACTATED RINGERS IV SOLN: INTRAVENOUS | Status: DC

## 2023-04-17 MED ORDER — OXYCODONE HCL 5 MG PO TABS
5.0000 mg | ORAL_TABLET | ORAL | Status: DC | PRN
  Administered 2023-04-17: 10 mg via ORAL
  Administered 2023-04-18 (×2): 5 mg via ORAL
  Filled 2023-04-17 (×2): qty 1
  Filled 2023-04-17: qty 2
  Filled 2023-04-17: qty 1

## 2023-04-17 MED ORDER — DEXAMETHASONE SODIUM PHOSPHATE 10 MG/ML IJ SOLN
INTRAMUSCULAR | Status: DC | PRN
  Administered 2023-04-17: 8 mg via INTRAVENOUS

## 2023-04-17 MED ORDER — BISACODYL 10 MG RE SUPP: 10.0000 mg | Freq: Every day | RECTAL | Status: DC | PRN

## 2023-04-17 MED ORDER — ACETAMINOPHEN 325 MG PO TABS: 325.0000 mg | ORAL_TABLET | Freq: Four times a day (QID) | ORAL | Status: DC | PRN

## 2023-04-17 MED ORDER — GABAPENTIN 300 MG PO CAPS
300.0000 mg | ORAL_CAPSULE | Freq: Two times a day (BID) | ORAL | Status: DC
  Administered 2023-04-17 – 2023-04-18 (×2): 300 mg via ORAL
  Filled 2023-04-17 (×2): qty 1

## 2023-04-17 MED ORDER — BUPIVACAINE IN DEXTROSE 0.75-8.25 % IT SOLN
INTRATHECAL | Status: DC | PRN
  Administered 2023-04-17: 2 mL via INTRATHECAL

## 2023-04-17 MED ORDER — HYDROMORPHONE HCL 1 MG/ML IJ SOLN: 0.2500 mg | INTRAMUSCULAR | Status: DC | PRN

## 2023-04-17 MED ORDER — METHOCARBAMOL 500 MG PO TABS
500.0000 mg | ORAL_TABLET | Freq: Four times a day (QID) | ORAL | Status: DC | PRN
  Administered 2023-04-17: 500 mg via ORAL
  Filled 2023-04-17: qty 1

## 2023-04-17 MED ORDER — PANTOPRAZOLE SODIUM 40 MG PO TBEC
40.0000 mg | DELAYED_RELEASE_TABLET | Freq: Every day | ORAL | Status: DC
  Administered 2023-04-18: 40 mg via ORAL
  Filled 2023-04-17: qty 1

## 2023-04-17 MED ORDER — METOCLOPRAMIDE HCL 5 MG/ML IJ SOLN: 5.0000 mg | Freq: Three times a day (TID) | INTRAMUSCULAR | Status: DC | PRN

## 2023-04-17 MED ORDER — MAGNESIUM CITRATE PO SOLN: 1.0000 | Freq: Once | ORAL | Status: DC | PRN

## 2023-04-17 MED ORDER — ACETAMINOPHEN 500 MG PO TABS
1000.0000 mg | ORAL_TABLET | Freq: Four times a day (QID) | ORAL | Status: AC
  Administered 2023-04-17 – 2023-04-18 (×4): 1000 mg via ORAL
  Filled 2023-04-17 (×4): qty 2

## 2023-04-17 SURGICAL SUPPLY — 62 items
BAG COUNTER SPONGE SURGICOUNT (BAG) IMPLANT
BAG SPNG CNTER NS LX DISP (BAG) ×1
BIT DRILL 2.0X128 (BIT) ×1 IMPLANT
BLADE SAW SGTL 73X25 THK (BLADE) ×1 IMPLANT
CLSR STERI-STRIP ANTIMIC 1/2X4 (GAUZE/BANDAGES/DRESSINGS) ×2 IMPLANT
COVER SURGICAL LIGHT HANDLE (MISCELLANEOUS) ×1 IMPLANT
CUP SECTOR GRIPTON 50MM (Cup) IMPLANT
DRAPE INCISE IOBAN 66X45 STRL (DRAPES) ×1 IMPLANT
DRAPE ORTHO SPLIT 77X108 STRL (DRAPES) ×2
DRAPE POUCH INSTRU U-SHP 10X18 (DRAPES) ×1 IMPLANT
DRAPE SHEET LG 3/4 BI-LAMINATE (DRAPES) ×1 IMPLANT
DRAPE SURG 17X11 SM STRL (DRAPES) ×1 IMPLANT
DRAPE SURG ORHT 6 SPLT 77X108 (DRAPES) ×2 IMPLANT
DRAPE U-SHAPE 47X51 STRL (DRAPES) ×1 IMPLANT
DRSG MEPILEX POST OP 4X12 (GAUZE/BANDAGES/DRESSINGS) IMPLANT
DRSG MEPILEX POST OP 4X8 (GAUZE/BANDAGES/DRESSINGS) ×1 IMPLANT
DURAPREP 26ML APPLICATOR (WOUND CARE) ×2 IMPLANT
ELECT BLADE TIP CTD 4 INCH (ELECTRODE) ×1 IMPLANT
ELECT REM PT RETURN 15FT ADLT (MISCELLANEOUS) ×1 IMPLANT
ELIMINATOR HOLE APEX DEPUY (Hips) IMPLANT
FACESHIELD WRAPAROUND (MASK) ×2 IMPLANT
FACESHIELD WRAPAROUND OR TEAM (MASK) ×2 IMPLANT
GLOVE BIO SURGEON STRL SZ 6.5 (GLOVE) ×1 IMPLANT
GLOVE BIO SURGEON STRL SZ7.5 (GLOVE) ×1 IMPLANT
GLOVE BIOGEL PI IND STRL 7.0 (GLOVE) ×1 IMPLANT
GLOVE BIOGEL PI IND STRL 8 (GLOVE) ×1 IMPLANT
GOWN STRL SURGICAL XL XLNG (GOWN DISPOSABLE) ×2 IMPLANT
HEAD FEMORAL 32 CERAMIC (Hips) IMPLANT
HOOD PEEL AWAY T7 (MISCELLANEOUS) ×3 IMPLANT
K-WIRE TROCAR PT 2.0 150MM (WIRE) ×1
KIT BASIN OR (CUSTOM PROCEDURE TRAY) ×1 IMPLANT
KIT TURNOVER KIT A (KITS) IMPLANT
KWIRE TROCAR PT 2.0 150 (WIRE) ×1 IMPLANT
KWIRE TROCAR PT 2.0 150MM (WIRE) ×1 IMPLANT
LINER ACET PNNCL PLUS4 NEUTRAL (Hips) IMPLANT
MANIFOLD NEPTUNE II (INSTRUMENTS) ×1 IMPLANT
NDL MA TROC 1/2 (NEEDLE) IMPLANT
NDL SAFETY ECLIP 18X1.5 (MISCELLANEOUS) ×2 IMPLANT
NEEDLE ANCHOR KEITH 2 7/8 STR (NEEDLE) ×1 IMPLANT
NEEDLE MA TROC 1/2 (NEEDLE) IMPLANT
NS IRRIG 1000ML POUR BTL (IV SOLUTION) ×1 IMPLANT
PACK TOTAL JOINT (CUSTOM PROCEDURE TRAY) ×1 IMPLANT
PINNACLE PLUS 4 NEUTRAL (Hips) ×1 IMPLANT
PROTECTOR NERVE ULNAR (MISCELLANEOUS) ×1 IMPLANT
SCREW 6.5MMX25MM (Screw) IMPLANT
SUCTION FRAZIER HANDLE 12FR (TUBING) ×1
SUCTION TUBE FRAZIER 12FR DISP (TUBING) ×1 IMPLANT
SUT ETHIBOND NAB CT1 #1 30IN (SUTURE) ×3 IMPLANT
SUT VIC AB 0 CT1 36 (SUTURE) ×1 IMPLANT
SUT VIC AB 1 CT1 27 (SUTURE) ×1
SUT VIC AB 1 CT1 27XBRD ANBCTR (SUTURE) IMPLANT
SUT VIC AB 1 CT1 36 (SUTURE) ×2 IMPLANT
SUT VIC AB 2-0 CT1 27 (SUTURE) ×2
SUT VIC AB 2-0 CT1 TAPERPNT 27 (SUTURE) ×2 IMPLANT
SUT VIC AB 3-0 SH 27 (SUTURE) ×2
SUT VIC AB 3-0 SH 27X BRD (SUTURE) ×2 IMPLANT
SYR CONTROL 10ML LL (SYRINGE) ×2 IMPLANT
TAP DUOFIX SZ5 STD OFF (Hips) IMPLANT
TOWEL OR 17X26 10 PK STRL BLUE (TOWEL DISPOSABLE) ×1 IMPLANT
TRAY FOLEY MTR SLVR 16FR STAT (SET/KITS/TRAYS/PACK) ×1 IMPLANT
TUBE SUCTION HIGH CAP CLEAR NV (SUCTIONS) ×1 IMPLANT
WATER STERILE IRR 1000ML POUR (IV SOLUTION) ×2 IMPLANT

## 2023-04-17 NOTE — Anesthesia Preprocedure Evaluation (Signed)
Anesthesia Evaluation  Patient identified by MRN, date of birth, ID band Patient awake    Reviewed: Allergy & Precautions, NPO status , Patient's Chart, lab work & pertinent test results  History of Anesthesia Complications (+) PONV and history of anesthetic complications  Airway Mallampati: II  TM Distance: >3 FB Neck ROM: Full    Dental  (+) Teeth Intact, Dental Advisory Given   Pulmonary asthma , sleep apnea    breath sounds clear to auscultation       Cardiovascular hypertension, Pt. on medications + Peripheral Vascular Disease   Rhythm:Regular Rate:Normal     Neuro/Psych  Headaches PSYCHIATRIC DISORDERS  Depression       GI/Hepatic Neg liver ROS,GERD  Medicated,,  Endo/Other    Renal/GU      Musculoskeletal  (+) Arthritis , Osteoarthritis,    Abdominal Normal abdominal exam  (+)   Peds  Hematology   Anesthesia Other Findings   Reproductive/Obstetrics                             Anesthesia Physical Anesthesia Plan  ASA: 3  Anesthesia Plan: Spinal and MAC   Post-op Pain Management: Tylenol PO (pre-op)*   Induction: Intravenous  PONV Risk Score and Plan: 3 and Ondansetron, Dexamethasone, Midazolam and Treatment may vary due to age or medical condition  Airway Management Planned: Simple Face Mask  Additional Equipment: None  Intra-op Plan:   Post-operative Plan:   Informed Consent: I have reviewed the patients History and Physical, chart, labs and discussed the procedure including the risks, benefits and alternatives for the proposed anesthesia with the patient or authorized representative who has indicated his/her understanding and acceptance.     Dental advisory given  Plan Discussed with: CRNA  Anesthesia Plan Comments:        Anesthesia Quick Evaluation

## 2023-04-17 NOTE — Discharge Instructions (Addendum)
INSTRUCTIONS AFTER JOINT REPLACEMENT   Remove items at home which could result in a fall. This includes throw rugs or furniture in walking pathways ICE to the affected joint every three hours while awake for 30 minutes at a time, for at least the first 3-5 days, and then as needed for pain and swelling.  Continue to use ice for pain and swelling. You may notice swelling that will progress down to the foot and ankle.  This is normal after surgery.  Elevate your leg when you are not up walking on it.   Continue to use the breathing machine you got in the hospital (incentive spirometer) which will help keep your temperature down.  It is common for your temperature to cycle up and down following surgery, especially at night when you are not up moving around and exerting yourself.  The breathing machine keeps your lungs expanded and your temperature down.   DIET:  As you were doing prior to hospitalization, we recommend a well-balanced diet.  DRESSING / WOUND CARE / SHOWERING  You may shower 3 days after surgery, but keep the wounds dry during showering.  You may use an occlusive plastic wrap (Press'n Seal for example), NO SOAKING/SUBMERGING IN THE BATHTUB.  If the bandage gets wet, change with a clean dry gauze.  If the incision gets wet, pat the wound dry with a clean towel.  ACTIVITY  Increase activity slowly as tolerated, but follow the weight bearing instructions below.   No driving for 6 weeks or until further direction given by your physician.  You cannot drive while taking narcotics.  No lifting or carrying greater than 10 lbs. until further directed by your surgeon. Avoid periods of inactivity such as sitting longer than an hour when not asleep. This helps prevent blood clots.  You may return to work once you are authorized by your doctor.     WEIGHT BEARING   Weight bearing as tolerated with assist device (walker, cane, etc) as directed, use it as long as suggested by your surgeon or  therapist, typically at least 4-6 weeks.   EXERCISES  Results after joint replacement surgery are often greatly improved when you follow the exercise, range of motion and muscle strengthening exercises prescribed by your doctor. Safety measures are also important to protect the joint from further injury. Any time any of these exercises cause you to have increased pain or swelling, decrease what you are doing until you are comfortable again and then slowly increase them. If you have problems or questions, call your caregiver or physical therapist for advice.   Rehabilitation is important following a joint replacement. After just a few days of immobilization, the muscles of the leg can become weakened and shrink (atrophy).  These exercises are designed to build up the tone and strength of the thigh and leg muscles and to improve motion. Often times heat used for twenty to thirty minutes before working out will loosen up your tissues and help with improving the range of motion but do not use heat for the first two weeks following surgery (sometimes heat can increase post-operative swelling).   These exercises can be done on a training (exercise) mat, on the floor, on a table or on a bed. Use whatever works the best and is most comfortable for you.    Use music or television while you are exercising so that the exercises are a pleasant break in your day. This will make your life better with the exercises acting   as a break in your routine that you can look forward to.   Perform all exercises about fifteen times, three times per day or as directed.  You should exercise both the operative leg and the other leg as well.  Exercises include:   Quad Sets - Tighten up the muscle on the front of the thigh (Quad) and hold for 5-10 seconds.   Straight Leg Raises - With your knee straight (if you were given a brace, keep it on), lift the leg to 60 degrees, hold for 3 seconds, and slowly lower the leg.  Perform this  exercise against resistance later as your leg gets stronger.  Leg Slides: Lying on your back, slowly slide your foot toward your buttocks, bending your knee up off the floor (only go as far as is comfortable). Then slowly slide your foot back down until your leg is flat on the floor again.  Angel Wings: Lying on your back spread your legs to the side as far apart as you can without causing discomfort.  Hamstring Strength:  Lying on your back, push your heel against the floor with your leg straight by tightening up the muscles of your buttocks.  Repeat, but this time bend your knee to a comfortable angle, and push your heel against the floor.  You may put a pillow under the heel to make it more comfortable if necessary.   A rehabilitation program following joint replacement surgery can speed recovery and prevent re-injury in the future due to weakened muscles. Contact your doctor or a physical therapist for more information on knee rehabilitation.    CONSTIPATION  Constipation is defined medically as fewer than three stools per week and severe constipation as less than one stool per week.  Even if you have a regular bowel pattern at home, your normal regimen is likely to be disrupted due to multiple reasons following surgery.  Combination of anesthesia, postoperative narcotics, change in appetite and fluid intake all can affect your bowels.   YOU MUST use at least one of the following options; they are listed in order of increasing strength to get the job done.  They are all available over the counter, and you may need to use some, POSSIBLY even all of these options:    Drink plenty of fluids (prune juice may be helpful) and high fiber foods Colace 100 mg by mouth twice a day  Senokot for constipation as directed and as needed Dulcolax (bisacodyl), take with full glass of water  Miralax (polyethylene glycol) once or twice a day as needed.  If you have tried all these things and are unable to have a  bowel movement in the first 3-4 days after surgery call either your surgeon or your primary doctor.    If you experience loose stools or diarrhea, hold the medications until you stool forms back up.  If your symptoms do not get better within 1 week or if they get worse, check with your doctor.  If you experience "the worst abdominal pain ever" or develop nausea or vomiting, please contact the office immediately for further recommendations for treatment.   ITCHING:  If you experience itching with your medications, try taking only a single pain pill, or even half a pain pill at a time.  You can also use Benadryl over the counter for itching or also to help with sleep.   TED HOSE STOCKINGS:  Use stockings on both legs until for at least 2 weeks or as directed by   physician office. They may be removed at night for sleeping.  MEDICATIONS:  See your medication summary on the "After Visit Summary" that nursing will review with you.  You may have some home medications which will be placed on hold until you complete the course of blood thinner medication.  It is important for you to complete the blood thinner medication as prescribed.  PRECAUTIONS:  If you experience chest pain or shortness of breath - call 911 immediately for transfer to the hospital emergency department.   If you develop a fever greater that 101 F, purulent drainage from wound, increased redness or drainage from wound, foul odor from the wound/dressing, or calf pain - CONTACT YOUR SURGEON.                                                   FOLLOW-UP APPOINTMENTS:  If you do not already have a post-op appointment, please call the office for an appointment to be seen by your surgeon.  Guidelines for how soon to be seen are listed in your "After Visit Summary", but are typically between 1-4 weeks after surgery.  OTHER INSTRUCTIONS:     POST-OPERATIVE OPIOID TAPER INSTRUCTIONS: It is important to wean off of your opioid medication as soon  as possible. If you do not need pain medication after your surgery it is ok to stop day one. Opioids include: Codeine, Hydrocodone(Norco, Vicodin), Oxycodone(Percocet, oxycontin) and hydromorphone amongst others.  Long term and even short term use of opiods can cause: Increased pain response Dependence Constipation Depression Respiratory depression And more.  Withdrawal symptoms can include Flu like symptoms Nausea, vomiting And more Techniques to manage these symptoms Hydrate well Eat regular healthy meals Stay active Use relaxation techniques(deep breathing, meditating, yoga) Do Not substitute Alcohol to help with tapering If you have been on opioids for less than two weeks and do not have pain than it is ok to stop all together.  Plan to wean off of opioids This plan should start within one week post op of your joint replacement. Maintain the same interval or time between taking each dose and first decrease the dose.  Cut the total daily intake of opioids by one tablet each day Next start to increase the time between doses. The last dose that should be eliminated is the evening dose.   MAKE SURE YOU:  Understand these instructions.  Get help right away if you are not doing well or get worse.    Thank you for letting us be a part of your medical care team.  It is a privilege we respect greatly.  We hope these instructions will help you stay on track for a fast and full recovery!   Information on my medicine - ELIQUIS (apixaban)   Why was Eliquis prescribed for you? Eliquis was prescribed for you to reduce the risk of blood clots forming after orthopedic surgery.    What do You need to know about Eliquis? Take your Eliquis TWICE DAILY - one tablet in the morning and one tablet in the evening with or without food.  It would be best to take the dose about the same time each day.  If you have difficulty swallowing the tablet whole please discuss with your pharmacist how  to take the medication safely.  Take Eliquis exactly as prescribed by your doctor and DO   NOT stop taking Eliquis without talking to the doctor who prescribed the medication.  Stopping without other medication to take the place of Eliquis may increase your risk of developing a clot.  After discharge, you should have regular check-up appointments with your healthcare provider that is prescribing your Eliquis.  What do you do if you miss a dose? If a dose of ELIQUIS is not taken at the scheduled time, take it as soon as possible on the same day and twice-daily administration should be resumed.  The dose should not be doubled to make up for a missed dose.  Do not take more than one tablet of ELIQUIS at the same time.  Important Safety Information A possible side effect of Eliquis is bleeding. You should call your healthcare provider right away if you experience any of the following: Bleeding from an injury or your nose that does not stop. Unusual colored urine (red or dark brown) or unusual colored stools (red or black). Unusual bruising for unknown reasons. A serious fall or if you hit your head (even if there is no bleeding).  Some medicines may interact with Eliquis and might increase your risk of bleeding or clotting while on Eliquis. To help avoid this, consult your healthcare provider or pharmacist prior to using any new prescription or non-prescription medications, including herbals, vitamins, non-steroidal anti-inflammatory drugs (NSAIDs) and supplements.  This website has more information on Eliquis (apixaban): http://www.eliquis.com/eliquis/home    

## 2023-04-17 NOTE — Transfer of Care (Signed)
Immediate Anesthesia Transfer of Care Note  Patient: Lisa Moody  Procedure(s) Performed: Procedure(s): TOTAL HIP ARTHROPLASTY (Right)  Patient Location: PACU  Anesthesia Type:MAC and Spinal  Level of Consciousness: Patient easily awoken, sedated, comfortable, cooperative, following commands, responds to stimulation.   Airway & Oxygen Therapy: Patient spontaneously breathing, ventilating well, oxygen via simple oxygen mask.  Post-op Assessment: Report given to PACU RN, vital signs reviewed and stable.   Post vital signs: Reviewed and stable.  Complications: No apparent anesthesia complications  Last Vitals:  Vitals Value Taken Time  BP 107/55 04/17/23 1438  Temp    Pulse 80 04/17/23 1439  Resp 17 04/17/23 1438  SpO2 96 % 04/17/23 1439  Vitals shown include unvalidated device data.  Last Pain:  Vitals:   04/17/23 1112  TempSrc:   PainSc: 0-No pain         Complications: No notable events documented.

## 2023-04-17 NOTE — Interval H&P Note (Signed)
History and Physical Interval Note:  04/17/2023 11:33 AM  Lisa Moody  has presented today for surgery, with the diagnosis of DJD right hip.  The various methods of treatment have been discussed with the patient and family. After consideration of risks, benefits and other options for treatment, the patient has consented to  Procedure(s): TOTAL HIP ARTHROPLASTY (Right) as a surgical intervention.  The patient's history has been reviewed, patient examined, no change in status, stable for surgery.  I have reviewed the patient's chart and labs.  Questions were answered to the patient's satisfaction.     Eulas Post

## 2023-04-17 NOTE — Op Note (Signed)
04/17/2023  2:06 PM  PATIENT:  Lisa Moody   MRN: [id number]  PRE-OPERATIVE DIAGNOSIS: Right hip primary localized osteoarthritis  POST-OPERATIVE DIAGNOSIS:  same  PROCEDURE:  Procedure(s): TOTAL HIP ARTHROPLASTY  PREOPERATIVE INDICATIONS:    Lisa Moody is an 77 y.o. female who has a diagnosis of right hip primary localized osteoarthritis and elected for surgical management after failing conservative treatment.  The risks benefits and alternatives were discussed with the patient including but not limited to the risks of nonoperative treatment, versus surgical intervention including infection, bleeding, nerve injury, periprosthetic fracture, the need for revision surgery, dislocation, leg length discrepancy, blood clots, cardiopulmonary complications, morbidity, mortality, among others, and they were willing to proceed.     OPERATIVE REPORT     SURGEON:  Teryl Lucy, MD    ASSISTANT:  Janine Ores, PA-C, (Present throughout the entire procedure,  necessary for completion of procedure in a timely manner, assisting with retraction, instrumentation, and closure)     ANESTHESIA: Spinal  ESTIMATED BLOOD LOSS: 300 mL    COMPLICATIONS:  None.     UNIQUE ASPECTS OF THE CASE: Her acetabulum was fairly shallow.  She had fairly extensive degenerative changes on the femoral head, with some delamination.  It was more impressive than was predicted by the x-ray.  My acetabular position was slightly anteverted, I had about 1 nozzle Bretz bone still exposed, and some beads showing posteriorly and superiorly.  The hip was extremely stable, I had a little bit of shuck, but she did not impinge posteriorly, and was stable with the hip flexed to 90 degrees and over 70 degrees of internal rotation.  Her leg lengths did not feel significantly different preoperatively.  I tried to match her as closely as possible.  COMPONENTS:  [employer] fit femur size 5 with a 32 mm + 1 ceramic head  ball and a Gription Acetabular shell size 50, with a single cancellous screw for backup fixation, with an apex hole eliminator and a +4 neutral polyethylene liner.    PROCEDURE IN DETAIL:   The patient was met in the holding area and  identified.  The appropriate hip was identified and marked at the operative site.  The patient was then transported to the OR  and  placed under anesthesia.  At that point, the patient was  placed in the lateral decubitus position with the operative side up and  secured to the operating room table and all bony prominences padded.     The operative lower extremity was prepped from the iliac crest to the distal leg.  Sterile draping was performed.  Time out was performed prior to incision.      A routine posterolateral approach was utilized via sharp dissection  carried down to the subcutaneous tissue.  Gross bleeders were Bovie coagulated.  The iliotibial band was identified and incised along the length of the skin incision.  Self-retaining retractors were  inserted.  With the hip internally rotated, the short external rotators  were identified. The piriformis and capsule was tagged with FiberWire, and the hip capsule released in a T-type fashion.  The femoral neck was exposed, and I resected the femoral neck using the appropriate jig. This was performed at approximately a thumb's breadth above the lesser trochanter.    I then exposed the deep acetabulum, cleared out any tissue including the ligamentum teres.  A wing retractor was placed.  After adequate visualization, I excised the labrum, and then sequentially reamed.  I  placed the trial acetabulum, which seated nicely, and then impacted the real cup into place.  Appropriate version and inclination was confirmed clinically matching their bony anatomy, and also with the use of the jig.  I placed a cancellous screw to augment fixation.  A trial polyethylene liner was placed and the wing retractor removed.    I then  prepared the proximal femur using the cookie-cutter, the lateralizing reamer, and then sequentially reamed and broached.  A trial broach, neck, and head was utilized, and I reduced the hip and it was found to have excellent stability with functional range of motion. The trial components were then removed, and the real polyethylene liner was placed.  I then impacted the real femoral prosthesis into place into the appropriate version, slightly anteverted to the normal anatomy, and I impacted the real head ball into place. The hip was then reduced and taken through functional range of motion and found to have excellent stability. Leg lengths were restored.  I then used a 2 mm drill bits to pass the Ethibond suture from the capsule and piriformis through the greater trochanter, and secured this. Excellent posterior capsular repair was achieved. I also closed the T in the capsule.  I then irrigated the hip copiously again with pulse lavage, and repaired the fascia with Vicryl, followed by Vicryl for the subcutaneous tissue, Monocryl for the skin, Steri-Strips and sterile gauze. The wounds were injected. The patient was then awakened and returned to PACU in stable and satisfactory condition. There were no complications.  Teryl Lucy, MD Orthopedic Surgeon [phone]   04/17/2023 2:06 PM

## 2023-04-17 NOTE — Anesthesia Procedure Notes (Signed)
Procedure Name: MAC Date/Time: 04/17/2023 12:27 PM  Performed by: Ludwig Lean, CRNAPre-anesthesia Checklist: Patient identified, Emergency Drugs available, Suction available and Patient being monitored Patient Re-evaluated:Patient Re-evaluated prior to induction Oxygen Delivery Method: Simple face mask Preoxygenation: Pre-oxygenation with 100% oxygen Induction Type: IV induction Placement Confirmation: positive ETCO2 and breath sounds checked- equal and bilateral

## 2023-04-17 NOTE — Anesthesia Procedure Notes (Signed)
Spinal  Patient location during procedure: OR Start time: 04/17/2023 12:29 PM End time: 04/17/2023 12:34 PM Reason for block: surgical anesthesia Staffing Performed: anesthesiologist  Anesthesiologist: Lowella Curb, MD Performed by: Lowella Curb, MD Authorized by: Lowella Curb, MD   Preanesthetic Checklist Completed: patient identified, IV checked, site marked, risks and benefits discussed, surgical consent, monitors and equipment checked, pre-op evaluation and timeout performed Spinal Block Patient position: sitting Prep: DuraPrep Patient monitoring: heart rate, cardiac monitor, continuous pulse ox and blood pressure Approach: midline Location: L3-4 Injection technique: single-shot Needle Needle type: Quincke  Needle gauge: 22 G Needle length: 9 cm Assessment Sensory level: T4 Events: CSF return

## 2023-04-17 NOTE — Anesthesia Postprocedure Evaluation (Signed)
Anesthesia Post Note  Patient: Lisa Moody  Procedure(s) Performed: TOTAL HIP ARTHROPLASTY (Right: Hip)     Patient location during evaluation: PACU Anesthesia Type: MAC Level of consciousness: awake and alert Pain management: pain level controlled Vital Signs Assessment: post-procedure vital signs reviewed and stable Respiratory status: spontaneous breathing, nonlabored ventilation and respiratory function stable Cardiovascular status: blood pressure returned to baseline and stable Postop Assessment: no apparent nausea or vomiting Anesthetic complications: no   No notable events documented.  Last Vitals:  Vitals:   04/17/23 1615 04/17/23 1630  BP: 139/72 132/77  Pulse: 74 76  Resp: 14 16  Temp:    SpO2: 97% 95%    Last Pain:  Vitals:   04/17/23 1600  TempSrc:   PainSc: 0-No pain                 Lowella Curb

## 2023-04-18 ENCOUNTER — Other Ambulatory Visit: Payer: Self-pay

## 2023-04-18 ENCOUNTER — Encounter (HOSPITAL_COMMUNITY): Payer: Self-pay | Admitting: Orthopedic Surgery

## 2023-04-18 DIAGNOSIS — M1611 Unilateral primary osteoarthritis, right hip: Secondary | ICD-10-CM | POA: Diagnosis not present

## 2023-04-18 LAB — BASIC METABOLIC PANEL
Anion gap: 5 (ref 5–15)
BUN: 24 mg/dL — ABNORMAL HIGH (ref 8–23)
CO2: 22 mmol/L (ref 22–32)
Calcium: 8.6 mg/dL — ABNORMAL LOW (ref 8.9–10.3)
Chloride: 111 mmol/L (ref 98–111)
Creatinine, Ser: 0.69 mg/dL (ref 0.44–1.00)
GFR, Estimated: >60 mL/min (ref >60)
Glucose, Bld: 188 mg/dL — ABNORMAL HIGH (ref 70–99)
Potassium: 4 mmol/L (ref 3.5–5.1)
Sodium: 138 mmol/L (ref 135–145)

## 2023-04-18 LAB — CBC
HCT: 31.6 % — ABNORMAL LOW (ref 36.0–46.0)
Hemoglobin: 10 g/dL — ABNORMAL LOW (ref 12.0–15.0)
MCH: 29.3 pg (ref 26.0–34.0)
MCHC: 31.6 g/dL (ref 30.0–36.0)
MCV: 92.7 fL (ref 80.0–100.0)
Platelets: 183 10*3/uL (ref 150–400)
RBC: 3.41 MIL/uL — ABNORMAL LOW (ref 3.87–5.11)
RDW: 14.8 % (ref 11.5–15.5)
WBC: 9.3 10*3/uL (ref 4.0–10.5)
nRBC: 0 % (ref 0.0–0.2)

## 2023-04-18 NOTE — Progress Notes (Signed)
Physical Therapy Treatment Patient Details Name: Lisa Moody MRN: [id number] DOB: [birth year] Today's Date: 04/18/2023   History of Present Illness 77 y.o. female admitted 04/17/23 for R THA, posterior approach but no hip precautions per orders. PMH: L TKA 2010, R RCR 2016, HTN, PE, sleep apnea, osteoporosis,    PT Comments    Pt tolerated increased ambulation distance of 130' with RW. Reviewed HEP. She is ready to DC from PT standpoint.    Recommendations for follow up therapy are one component of a multi-disciplinary discharge planning process, led by the attending physician.  Recommendations may be updated based on patient status, additional functional criteria and insurance authorization.  Follow Up Recommendations       Assistance Recommended at Discharge Intermittent Supervision/Assistance  Patient can return home with the following A little help with bathing/dressing/bathroom;Assist for transportation;Help with stairs or ramp for entrance;Assistance with cooking/housework   Equipment Recommendations  None recommended by PT    Recommendations for Other Services       Precautions / Restrictions Precautions Precautions: Fall Precaution Comments: pt denies falls in past 6 months Restrictions Weight Bearing Restrictions: No RLE Weight Bearing: Weight bearing as tolerated     Mobility  Bed Mobility Overal bed mobility: Modified Independent             General bed mobility comments: HOB up, used rail    Transfers Overall transfer level: Needs assistance Equipment used: Rolling walker (2 wheels) Transfers: Sit to/from Stand Sit to Stand: Supervision           General transfer comment: VCs hand placement    Ambulation/Gait Ambulation/Gait assistance: Supervision Gait Distance (Feet): 140 Feet Assistive device: Rolling walker (2 wheels) Gait Pattern/deviations: Step-to pattern, Decreased step length - right, Decreased step length - left Gait velocity:  decr     General Gait Details: VCs sequencing, no loss of balance   Stairs             Wheelchair Mobility    Modified Rankin (Stroke Patients Only)       Balance Overall balance assessment: Modified Independent                                          Cognition Arousal/Alertness: Awake/alert Behavior During Therapy: WFL for tasks assessed/performed Overall Cognitive Status: Within Functional Limits for tasks assessed                                          Exercises Total Joint Exercises Ankle Circles/Pumps: AROM, Both, 10 reps, Supine Quad Sets: AROM, Both, 5 reps, Supine Short Arc Quad: AROM, Right, 5 reps, Supine Heel Slides: AAROM, Right, Supine, 5 reps Hip ABduction/ADduction: AAROM, Right, Supine, 5 reps Long Arc Quad: AROM, Right, 10 reps, Seated    General Comments        Pertinent Vitals/Pain Pain Assessment Pain Assessment: 0-10 Pain Score: 7  Pain Location: R hip with walking Pain Descriptors / Indicators: Sore Pain Intervention(s): Limited activity within patient's tolerance, Monitored during session, Patient requesting pain meds-RN notified, Ice applied    Home Living                          Prior Function  PT Goals (current goals can now be found in the care plan section) Acute Rehab PT Goals Patient Stated Goal: play tennis, walk PT Goal Formulation: All assessment and education complete, DC therapy Progress towards PT goals: Progressing toward goals    Frequency           PT Plan      Co-evaluation              AM-PAC PT "6 Clicks" Mobility   Outcome Measure  Help needed turning from your back to your side while in a flat bed without using bedrails?: A Little Help needed moving from lying on your back to sitting on the side of a flat bed without using bedrails?: A Little Help needed moving to and from a bed to a chair (including a wheelchair)?: None Help  needed standing up from a chair using your arms (e.g., wheelchair or bedside chair)?: None Help needed to walk in hospital room?: None Help needed climbing 3-5 steps with a railing? : A Little 6 Click Score: 21    End of Session Equipment Utilized During Treatment: Gait belt Activity Tolerance: Patient tolerated treatment well Patient left: in chair;with call bell/phone within reach;with family/visitor present Nurse Communication: Mobility status PT Visit Diagnosis: Pain;Other abnormalities of gait and mobility (R26.89) Pain - Right/Left: Right Pain - part of body: Hip     Time: 1351-1414 PT Time Calculation (min) (ACUTE ONLY): 23 min  Charges:  $Gait Training: 8-22 mins $Therapeutic Exercise: 8-22 mins                     Ralene Bathe Kistler PT 04/18/2023  Acute Rehabilitation Services  Office [phone]

## 2023-04-18 NOTE — TOC Transition Note (Signed)
Transition of Care [facility]) - CM/SW Discharge Note  Patient Details  Name: Lisa Moody MRN: [id number] Date of Birth: 01-13-1946  [facility] CM/SW Contact:  Ewing Schlein, LCSW Phone Number: 04/18/2023, 11:07 AM  Clinical Narrative: Patient is expected to discharge home after working with PT. CSW met with patient and spouse to review discharge plan. Patient will go home with OPPT, which husband reported he thinks is at [facility]. Patient has a rolling walker at home and is considering a raised toilet seat after discharge if needed. TOC signing off.    Final next level of care: OP Rehab Barriers to Discharge: No Barriers Identified  Patient Goals and CMS Choice Choice offered to / list presented to : NA  Discharge Plan and Services Additional resources added to the After Visit Summary for         DME Arranged: N/A DME Agency: NA  Social Determinants of Health (SDOH) Interventions SDOH Screenings   Food Insecurity: No Food Insecurity (04/17/2023)  Housing: Low Risk  (04/17/2023)  Transportation Needs: No Transportation Needs (04/17/2023)  Utilities: Not At Risk (04/17/2023)  Tobacco Use: Low Risk  (04/17/2023)   Readmission Risk Interventions     No data to display

## 2023-04-18 NOTE — Discharge Summary (Signed)
Discharge Summary  Patient ID: Lisa Moody MRN: [id number] DOB/AGE: [birth year] 77 y.o.  Admit date: 04/17/2023 Discharge date: 04/18/2023  Admission Diagnoses:  S/P total hip arthroplasty  Discharge Diagnoses:  Principal Problem:   S/P total hip arthroplasty   Past Medical History:  Diagnosis Date   Anemia    years ago per pt   Arthritis    Asthma    seasonal   Back pain    Benign neoplasm of colon    Complication of anesthesia    anectine - caused her to have difficulty waking up and was on vent for few hours post op   Complication of anesthesia    pulmonary embolism 2006 -pt states it was determined that it was due to having the liposuction   COVID 2021   mild case   Diverticulitis    Diverticulosis of colon (without mention of hemorrhage)    Endometriosis    reason for hysterectomy.  ovaries remain.    Esophageal reflux    few times per month not routine   Esophagitis, unspecified    Headache(784.0)    use to have migraines - no longer have them   Hyperlipidemia    Hypertension    Osteopenia    Osteopenia    Osteoporosis    Other and unspecified hyperlipidemia    Other and unspecified hyperlipidemia    Other constipation    Palpitations    Personal history of other mental disorder    Pneumonia    PONV (postoperative nausea and vomiting)    Pulmonary embolism (HCC)    2006   Sleep apnea    no cpap, did weight loss    Surgeries: Procedure(s): TOTAL HIP ARTHROPLASTY on 04/17/2023   Consultants (if any):   Discharged Condition: Improved  Hospital Course: Lisa Moody is an 77 y.o. female who was admitted 04/17/2023 with a diagnosis of S/P total hip arthroplasty and went to the operating room on 04/17/2023 and underwent the above named procedures.    She was given perioperative antibiotics:  Anti-infectives (From admission, onward)    Start     Dose/Rate Route Frequency Ordered Stop   04/17/23 1830  ceFAZolin (ANCEF) IVPB 1 g/50 mL premix         1 g 100 mL/hr over 30 Minutes Intravenous Every 6 hours 04/17/23 1717 04/18/23 0102   04/17/23 1100  ceFAZolin (ANCEF) IVPB 2g/100 mL premix        2 g 200 mL/hr over 30 Minutes Intravenous On call to O.R. 04/17/23 1053 04/17/23 1236     .  She was given sequential compression devices, early ambulation, and Eliquis for DVT prophylaxis.  She benefited maximally from the hospital stay and there were no complications.    Recent vital signs:  Vitals:   04/18/23 0111 04/18/23 0637  BP: 96/72 102/68  Pulse: 85 76  Resp: 18 16  Temp: 97.8 F (36.6 C) 97.6 F (36.4 C)  SpO2: 97% 95%    Recent laboratory studies:  Lab Results  Component Value Date   HGB 10.0 (L) 04/18/2023   HGB 12.8 04/05/2023   HGB 11.7 (L) 01/10/2023   Lab Results  Component Value Date   WBC 9.3 04/18/2023   PLT 183 04/18/2023   Lab Results  Component Value Date   INR 0.89 02/13/2013   Lab Results  Component Value Date   NA 138 04/18/2023   K 4.0 04/18/2023   CL 111 04/18/2023   CO2 22 04/18/2023  BUN 24 (H) 04/18/2023   CREATININE 0.69 04/18/2023   GLUCOSE 188 (H) 04/18/2023    Discharge Medications:   Allergies as of 04/18/2023       Reactions   Anectine  [succinylcholine Chloride] Anaphylaxis   Succinylcholine    Respiratory suppression    Multi Vitamin-fluoride [multivitamins-fluoride] Nausea Only   iron        Medication List     STOP taking these medications    docusate sodium 100 MG capsule Commonly known as: COLACE   ibuprofen 200 MG tablet Commonly known as: ADVIL       TAKE these medications    acyclovir ointment 5 % Commonly known as: ZOVIRAX Apply 1 Application topically daily as needed (Blisters).   albuterol 108 (90 Base) MCG/ACT inhaler Commonly known as: VENTOLIN HFA Inhale 2 puffs into the lungs every 6 (six) hours as needed for shortness of breath or wheezing.   apixaban 2.5 MG Tabs tablet Commonly known as: Eliquis Take 1 tablet (2.5 mg total) by  mouth 2 (two) times daily.   atorvastatin 10 MG tablet Commonly known as: LIPITOR Take 10 mg by mouth daily.   denosumab 60 MG/ML Sosy injection Commonly known as: PROLIA Inject 60 mg into the skin every 6 (six) months.   estradiol 0.1 MG/GM vaginal cream Commonly known as: ESTRACE Place 1 g vaginally 2 (two) times a week.   ezetimibe 10 MG tablet Commonly known as: ZETIA Take 10 mg by mouth daily.   gabapentin 300 MG capsule Commonly known as: NEURONTIN Take 1 capsule (300 mg total) by mouth 3 (three) times daily. What changed: when to take this   hydrochlorothiazide 25 MG tablet Commonly known as: HYDRODIURIL Take 25 mg by mouth every morning.   methocarbamol 500 MG tablet Commonly known as: ROBAXIN Take 1 tablet (500 mg total) by mouth every 6 (six) hours as needed for muscle spasms.   ondansetron 4 MG tablet Commonly known as: Zofran Take 1 tablet (4 mg total) by mouth every 8 (eight) hours as needed for nausea or vomiting.   oxyCODONE 5 MG immediate release tablet Commonly known as: Roxicodone Take 1 tablet (5 mg total) by mouth every 4 (four) hours as needed for severe pain.   pantoprazole 40 MG tablet Commonly known as: PROTONIX Take 1 tablet (40 mg total) by mouth 2 (two) times daily. Office visit for further refills What changed: when to take this   polyethylene glycol 17 g packet Commonly known as: MIRALAX / GLYCOLAX Take 17 g by mouth at bedtime.   PROBIOTIC PO Take 1 capsule by mouth daily.   sennosides-docusate sodium 8.6-50 MG tablet Commonly known as: SENOKOT-S Take 2 tablets by mouth daily.   telmisartan 40 MG tablet Commonly known as: MICARDIS Take 40 mg by mouth at bedtime.   Vitamin D-3 125 MCG (5000 UT) Tabs Take 5,000 Units by mouth daily.        Diagnostic Studies: DG Hip Port Unilat With Pelvis 1V Right  Result Date: 04/17/2023 CLINICAL DATA:  Status post right hip arthroplasty EXAM: DG HIP (WITH OR WITHOUT PELVIS) 1V PORT  RIGHT COMPARISON:  None Available. FINDINGS: Changes consistent with right hip replacement are noted. No acute fracture or dislocation is noted. Postsurgical changes in lower lumbar spine are noted as well. IMPRESSION: Status post right hip replacement Electronically Signed   By: Alcide Clever M.D.   On: 04/17/2023 16:34    Disposition: Discharge disposition: 01-Home or Self Care  Follow-up Information     Teryl Lucy, MD. Schedule an appointment as soon as possible for a visit in 2 week(s).   Specialty: Orthopedic Surgery Contact information: [address] [place] [address] [phone]                  Signed: Annita Brod 04/18/2023, 7:40 AM

## 2023-04-18 NOTE — Progress Notes (Addendum)
     Subjective: 1 Day Post-Op s/p Procedure(s): TOTAL HIP ARTHROPLASTY  Patient seen and examined by Dr. Dion Saucier.  Patient is alert, oriented. Voiding, no BM. Denies chest pain, SOB, Calf pain. No nausea/vomiting. No other complaints.  Objective:  PE: VITALS:   Vitals:   04/17/23 1716 04/17/23 2154 04/18/23 0111 04/18/23 0637  BP: 125/79 (!) 89/64 96/72 102/68  Pulse: 78 91 85 76  Resp: 18 18 18 16   Temp: 97.6 F (36.4 C) 98.1 F (36.7 C) 97.8 F (36.6 C) 97.6 F (36.4 C)  TempSrc: Oral Oral Oral Oral  SpO2: 99% 91% 97% 95%  Weight:      Height:        Sensation intact distally Intact pulses distally Dorsiflexion/Plantar flexion intact Incision: dressing C/D/I  LABS  Results for orders placed or performed during the hospital encounter of 04/17/23 (from the past 24 hour(s))  CBC     Status: Abnormal   Collection Time: 04/18/23  3:47 AM  Result Value Ref Range   WBC 9.3 4.0 - 10.5 K/uL   RBC 3.41 (L) 3.87 - 5.11 MIL/uL   Hemoglobin 10.0 (L) 12.0 - 15.0 g/dL   HCT 82.9 (L) 56.2 - 13.0 %   MCV 92.7 80.0 - 100.0 fL   MCH 29.3 26.0 - 34.0 pg   MCHC 31.6 30.0 - 36.0 g/dL   RDW 86.5 78.4 - 69.6 %   Platelets 183 150 - 400 K/uL   nRBC 0.0 0.0 - 0.2 %  Basic metabolic panel     Status: Abnormal   Collection Time: 04/18/23  3:47 AM  Result Value Ref Range   Sodium 138 135 - 145 mmol/L   Potassium 4.0 3.5 - 5.1 mmol/L   Chloride 111 98 - 111 mmol/L   CO2 22 22 - 32 mmol/L   Glucose, Bld 188 (H) 70 - 99 mg/dL   BUN 24 (H) 8 - 23 mg/dL   Creatinine, Ser 2.95 0.44 - 1.00 mg/dL   Calcium 8.6 (L) 8.9 - 10.3 mg/dL   GFR, Estimated >28 >41 mL/min   Anion gap 5 5 - 15    DG Hip Port Unilat With Pelvis 1V Right  Result Date: 04/17/2023 CLINICAL DATA:  Status post right hip arthroplasty EXAM: DG HIP (WITH OR WITHOUT PELVIS) 1V PORT RIGHT COMPARISON:  None Available. FINDINGS: Changes consistent with right hip replacement are noted. No acute fracture or dislocation is  noted. Postsurgical changes in lower lumbar spine are noted as well. IMPRESSION: Status post right hip replacement Electronically Signed   By: Alcide Clever M.D.   On: 04/17/2023 16:34    Assessment/Plan: Principal Problem:   S/P total hip arthroplasty  1 Day Post-Op s/p Procedure(s): TOTAL HIP ARTHROPLASTY  Weightbearing: WBAT RLE, up with therapy Insicional and dressing care: Reinforce dressings as needed VTE prophylaxis: Eliquis 2.5 mg BID x 30 days Pain control: continue current regimen Follow - up plan: 2 weeks Dispo: plan to discharge home later today when passes PT  Armida Sans 04/18/2023, 6:43 AM

## 2023-04-18 NOTE — Evaluation (Signed)
Physical Therapy Evaluation Patient Details Name: Lisa Moody MRN: [id number] DOB: [birth year] Today's Date: 04/18/2023  History of Present Illness  77 y.o. female admitted 04/17/23 for R THA, posterior approach but no hip precautions per orders. PMH: L TKA 2010, R RCR 2016, HTN, PE, sleep apnea, osteoporosis,  Clinical Impression  Pt is mobilizing well, she ambulated 52' with RW and demonstrates good understanding of HEP. She is ready to DC home from a PT standpoint.        Recommendations for follow up therapy are one component of a multi-disciplinary discharge planning process, led by the attending physician.  Recommendations may be updated based on patient status, additional functional criteria and insurance authorization.  Follow Up Recommendations       Assistance Recommended at Discharge Intermittent Supervision/Assistance  Patient can return home with the following  A little help with bathing/dressing/bathroom;Assist for transportation;Help with stairs or ramp for entrance;Assistance with cooking/housework    Equipment Recommendations None recommended by PT  Recommendations for Other Services       Functional Status Assessment Patient has had a recent decline in their functional status and demonstrates the ability to make significant improvements in function in a reasonable and predictable amount of time.     Precautions / Restrictions Precautions Precautions: Fall Precaution Comments: pt denies falls in past 6 months Restrictions Weight Bearing Restrictions: No RLE Weight Bearing: Weight bearing as tolerated      Mobility  Bed Mobility Overal bed mobility: Modified Independent             General bed mobility comments: HOB up, used rail    Transfers Overall transfer level: Needs assistance Equipment used: Rolling walker (2 wheels) Transfers: Sit to/from Stand Sit to Stand: Supervision           General transfer comment: VCs hand placement     Ambulation/Gait Ambulation/Gait assistance: Supervision Gait Distance (Feet): 80 Feet Assistive device: Rolling walker (2 wheels) Gait Pattern/deviations: Step-to pattern, Decreased step length - right, Decreased step length - left Gait velocity: decr     General Gait Details: VCs sequencing, no loss of balance  Stairs            Wheelchair Mobility    Modified Rankin (Stroke Patients Only)       Balance Overall balance assessment: Modified Independent                                           Pertinent Vitals/Pain Pain Assessment Pain Assessment: 0-10 Pain Score: 5  Pain Location: R hip with walking Pain Descriptors / Indicators: Sore Pain Intervention(s): Limited activity within patient's tolerance, Monitored during session, Premedicated before session, Ice applied    Home Living Family/patient expects to be discharged to:: Private residence Living Arrangements: Spouse/significant other Available Help at Discharge: Family;Available 24 hours/day Type of Home: House Home Access: Level entry       Home Layout: One level Home Equipment: Agricultural consultant (2 wheels);Shower seat      Prior Function Prior Level of Function : Independent/Modified Independent;Driving             Mobility Comments: no falls in past 6 months, no AD prior to surgery ADLs Comments: independent     Hand Dominance        Extremity/Trunk Assessment   Upper Extremity Assessment Upper Extremity Assessment: Overall WFL for tasks assessed  Lower Extremity Assessment Lower Extremity Assessment: RLE deficits/detail RLE Deficits / Details: hip flexion AAROM ~40* limited by pain, knee ext at least 3/5 RLE Sensation: WNL RLE Coordination: WNL    Cervical / Trunk Assessment Cervical / Trunk Assessment: Normal  Communication   Communication: No difficulties  Cognition Arousal/Alertness: Awake/alert Behavior During Therapy: WFL for tasks  assessed/performed Overall Cognitive Status: Within Functional Limits for tasks assessed                                          General Comments      Exercises Total Joint Exercises Ankle Circles/Pumps: AROM, Both, 10 reps, Supine Quad Sets: AROM, Both, 5 reps, Supine Short Arc Quad: AROM, Right, 5 reps, Supine Heel Slides: AAROM, Right, 10 reps, Supine Hip ABduction/ADduction: AAROM, Right, 10 reps, Supine Long Arc Quad: AROM, Right, 10 reps, Seated   Assessment/Plan    PT Assessment Patient needs continued PT services  PT Problem List Decreased strength;Decreased range of motion;Decreased mobility;Decreased activity tolerance       PT Treatment Interventions      PT Goals (Current goals can be found in the Care Plan section)  Acute Rehab PT Goals Patient Stated Goal: play tennis, walk PT Goal Formulation: All assessment and education complete, DC therapy    Frequency       Co-evaluation               AM-PAC PT "6 Clicks" Mobility  Outcome Measure Help needed turning from your back to your side while in a flat bed without using bedrails?: A Little Help needed moving from lying on your back to sitting on the side of a flat bed without using bedrails?: A Little Help needed moving to and from a bed to a chair (including a wheelchair)?: None Help needed standing up from a chair using your arms (e.g., wheelchair or bedside chair)?: None Help needed to walk in hospital room?: None Help needed climbing 3-5 steps with a railing? : A Little 6 Click Score: 21    End of Session Equipment Utilized During Treatment: Gait belt Activity Tolerance: Patient tolerated treatment well Patient left: in chair;with call bell/phone within reach;with family/visitor present Nurse Communication: Mobility status PT Visit Diagnosis: Pain;Other abnormalities of gait and mobility (R26.89) Pain - Right/Left: Right Pain - part of body: Hip    Time: 1610-9604 PT Time  Calculation (min) (ACUTE ONLY): 39 min   Charges:   PT Evaluation $PT Eval Moderate Complexity: 1 Mod PT Treatments $Gait Training: 8-22 mins $Therapeutic Exercise: 8-22 mins        Ralene Bathe Kistler PT 04/18/2023  Acute Rehabilitation Services  Office [phone]

## 2023-05-02 ENCOUNTER — Ambulatory Visit (HOSPITAL_COMMUNITY)
Admission: RE | Admit: 2023-05-02 | Discharge: 2023-05-02 | Disposition: A | Discharge status: Home / Self Care | Payer: 59 | Source: Ambulatory Visit | Attending: Vascular Surgery | Admitting: Vascular Surgery

## 2023-05-02 ENCOUNTER — Other Ambulatory Visit (HOSPITAL_COMMUNITY): Payer: Self-pay | Admitting: Orthopedic Surgery

## 2023-05-02 DIAGNOSIS — R609 Edema, unspecified: Secondary | ICD-10-CM | POA: Insufficient documentation

## 2023-05-10 ENCOUNTER — Other Ambulatory Visit (HOSPITAL_COMMUNITY): Payer: Self-pay | Admitting: Orthopedic Surgery

## 2023-05-10 DIAGNOSIS — R609 Edema, unspecified: Secondary | ICD-10-CM

## 2023-05-16 ENCOUNTER — Ambulatory Visit (HOSPITAL_COMMUNITY)
Admission: RE | Admit: 2023-05-16 | Discharge: 2023-05-16 | Disposition: A | Discharge status: Home / Self Care | Payer: 59 | Source: Ambulatory Visit | Attending: Vascular Surgery | Admitting: Vascular Surgery

## 2023-05-16 DIAGNOSIS — R609 Edema, unspecified: Secondary | ICD-10-CM | POA: Insufficient documentation

## 2023-07-04 ENCOUNTER — Other Ambulatory Visit (HOSPITAL_BASED_OUTPATIENT_CLINIC_OR_DEPARTMENT_OTHER): Payer: Self-pay

## 2023-07-04 MED ORDER — ZEPBOUND 2.5 MG/0.5ML ~~LOC~~ SOAJ
2.5000 mg | SUBCUTANEOUS | 5 refills | Status: AC
  Filled 2023-07-04 – 2023-07-16 (×2): qty 2, 28d supply, fill #0

## 2023-07-10 ENCOUNTER — Other Ambulatory Visit (HOSPITAL_BASED_OUTPATIENT_CLINIC_OR_DEPARTMENT_OTHER): Payer: Self-pay

## 2023-07-16 ENCOUNTER — Other Ambulatory Visit (HOSPITAL_BASED_OUTPATIENT_CLINIC_OR_DEPARTMENT_OTHER): Payer: Self-pay

## 2023-08-13 ENCOUNTER — Other Ambulatory Visit (HOSPITAL_BASED_OUTPATIENT_CLINIC_OR_DEPARTMENT_OTHER): Payer: Self-pay

## 2023-08-13 MED ORDER — TIRZEPATIDE-WEIGHT MANAGEMENT 5 MG/0.5ML ~~LOC~~ SOAJ
5.0000 mg | SUBCUTANEOUS | 5 refills | Status: AC
  Filled 2023-08-13: qty 2, 28d supply, fill #0
  Filled 2023-08-29 – 2024-02-27 (×2): qty 2, 28d supply, fill #1

## 2023-08-29 ENCOUNTER — Other Ambulatory Visit (HOSPITAL_BASED_OUTPATIENT_CLINIC_OR_DEPARTMENT_OTHER): Payer: Self-pay

## 2023-08-29 MED ORDER — ZEPBOUND 7.5 MG/0.5ML ~~LOC~~ SOAJ
7.5000 mg | SUBCUTANEOUS | 3 refills | Status: AC
  Filled 2023-08-29 – 2023-09-03 (×2): qty 2, 28d supply, fill #0
  Filled 2023-10-17: qty 2, 28d supply, fill #1

## 2023-09-03 ENCOUNTER — Other Ambulatory Visit: Payer: Self-pay

## 2023-09-03 ENCOUNTER — Other Ambulatory Visit: Payer: Self-pay | Admitting: Internal Medicine

## 2023-09-03 ENCOUNTER — Other Ambulatory Visit (HOSPITAL_BASED_OUTPATIENT_CLINIC_OR_DEPARTMENT_OTHER): Payer: Self-pay

## 2023-09-03 DIAGNOSIS — Z0000 Encounter for general adult medical examination without abnormal findings: Secondary | ICD-10-CM

## 2023-09-05 ENCOUNTER — Other Ambulatory Visit: Payer: Self-pay | Admitting: Orthopedic Surgery

## 2023-09-05 DIAGNOSIS — M5412 Radiculopathy, cervical region: Secondary | ICD-10-CM

## 2023-09-07 NOTE — Discharge Instructions (Signed)

## 2023-09-10 ENCOUNTER — Inpatient Hospital Stay
Admission: RE | Admit: 2023-09-10 | Discharge: 2023-09-10 | Disposition: A | Discharge status: Home / Self Care | Payer: 59 | Source: Ambulatory Visit | Attending: Orthopedic Surgery | Admitting: Orthopedic Surgery

## 2023-09-10 DIAGNOSIS — M5412 Radiculopathy, cervical region: Secondary | ICD-10-CM

## 2023-09-10 MED ORDER — TRIAMCINOLONE ACETONIDE 40 MG/ML IJ SUSP (RADIOLOGY)
60.0000 mg | Freq: Once | INTRAMUSCULAR | Status: AC
  Administered 2023-09-10: 60 mg via EPIDURAL

## 2023-09-10 MED ORDER — IOPAMIDOL (ISOVUE-M 300) INJECTION 61%
1.0000 mL | Freq: Once | INTRAMUSCULAR | Status: AC | PRN
  Administered 2023-09-10: 1 mL via EPIDURAL

## 2023-09-19 ENCOUNTER — Other Ambulatory Visit (HOSPITAL_COMMUNITY): Payer: Self-pay | Admitting: *Deleted

## 2023-09-20 ENCOUNTER — Ambulatory Visit (HOSPITAL_COMMUNITY)
Admission: RE | Admit: 2023-09-20 | Discharge: 2023-09-20 | Disposition: A | Discharge status: Home / Self Care | Payer: 59 | Source: Ambulatory Visit | Attending: Internal Medicine | Admitting: Internal Medicine

## 2023-09-20 DIAGNOSIS — M810 Age-related osteoporosis without current pathological fracture: Secondary | ICD-10-CM | POA: Insufficient documentation

## 2023-09-20 MED ORDER — DENOSUMAB 60 MG/ML ~~LOC~~ SOSY
60.0000 mg | PREFILLED_SYRINGE | Freq: Once | SUBCUTANEOUS | Status: AC
  Administered 2023-09-20: 60 mg via SUBCUTANEOUS

## 2023-09-20 MED ORDER — DENOSUMAB 60 MG/ML ~~LOC~~ SOSY
PREFILLED_SYRINGE | SUBCUTANEOUS | Status: AC
  Filled 2023-09-20: qty 1

## 2023-09-24 ENCOUNTER — Ambulatory Visit
Admission: RE | Admit: 2023-09-24 | Discharge: 2023-09-24 | Disposition: A | Discharge status: Home / Self Care | Payer: BC Managed Care – PPO | Source: Ambulatory Visit | Attending: Internal Medicine | Admitting: Internal Medicine

## 2023-09-24 DIAGNOSIS — Z1231 Encounter for screening mammogram for malignant neoplasm of breast: Secondary | ICD-10-CM | POA: Diagnosis not present

## 2023-09-24 DIAGNOSIS — Z0000 Encounter for general adult medical examination without abnormal findings: Secondary | ICD-10-CM

## 2023-09-26 DIAGNOSIS — Z85828 Personal history of other malignant neoplasm of skin: Secondary | ICD-10-CM | POA: Diagnosis not present

## 2023-09-26 DIAGNOSIS — L820 Inflamed seborrheic keratosis: Secondary | ICD-10-CM | POA: Diagnosis not present

## 2023-09-26 DIAGNOSIS — D485 Neoplasm of uncertain behavior of skin: Secondary | ICD-10-CM | POA: Diagnosis not present

## 2023-09-26 DIAGNOSIS — D225 Melanocytic nevi of trunk: Secondary | ICD-10-CM | POA: Diagnosis not present

## 2023-09-26 DIAGNOSIS — L814 Other melanin hyperpigmentation: Secondary | ICD-10-CM | POA: Diagnosis not present

## 2023-09-26 DIAGNOSIS — L821 Other seborrheic keratosis: Secondary | ICD-10-CM | POA: Diagnosis not present

## 2023-09-26 DIAGNOSIS — D045 Carcinoma in situ of skin of trunk: Secondary | ICD-10-CM | POA: Diagnosis not present

## 2023-09-26 DIAGNOSIS — L570 Actinic keratosis: Secondary | ICD-10-CM | POA: Diagnosis not present

## 2023-10-04 ENCOUNTER — Other Ambulatory Visit (HOSPITAL_BASED_OUTPATIENT_CLINIC_OR_DEPARTMENT_OTHER): Payer: Self-pay

## 2023-10-04 MED ORDER — ZEPBOUND 10 MG/0.5ML ~~LOC~~ SOAJ
10.0000 mg | SUBCUTANEOUS | 3 refills | Status: AC
  Filled 2023-10-04 – 2023-10-08 (×2): qty 2, 28d supply, fill #0

## 2023-10-08 ENCOUNTER — Other Ambulatory Visit (HOSPITAL_BASED_OUTPATIENT_CLINIC_OR_DEPARTMENT_OTHER): Payer: Self-pay

## 2023-10-10 DIAGNOSIS — D485 Neoplasm of uncertain behavior of skin: Secondary | ICD-10-CM | POA: Diagnosis not present

## 2023-10-10 DIAGNOSIS — D492 Neoplasm of unspecified behavior of bone, soft tissue, and skin: Secondary | ICD-10-CM | POA: Diagnosis not present

## 2023-10-17 ENCOUNTER — Other Ambulatory Visit (HOSPITAL_BASED_OUTPATIENT_CLINIC_OR_DEPARTMENT_OTHER): Payer: Self-pay

## 2023-10-19 ENCOUNTER — Other Ambulatory Visit (HOSPITAL_BASED_OUTPATIENT_CLINIC_OR_DEPARTMENT_OTHER): Payer: Self-pay

## 2023-10-19 MED ORDER — ZEPBOUND 7.5 MG/0.5ML ~~LOC~~ SOAJ
7.5000 mg | SUBCUTANEOUS | 3 refills | Status: AC
  Filled 2023-10-19: qty 2, 28d supply, fill #0

## 2023-10-23 ENCOUNTER — Other Ambulatory Visit (HOSPITAL_BASED_OUTPATIENT_CLINIC_OR_DEPARTMENT_OTHER): Payer: Self-pay

## 2023-10-24 DIAGNOSIS — E538 Deficiency of other specified B group vitamins: Secondary | ICD-10-CM | POA: Diagnosis not present

## 2023-11-16 ENCOUNTER — Other Ambulatory Visit (HOSPITAL_BASED_OUTPATIENT_CLINIC_OR_DEPARTMENT_OTHER): Payer: Self-pay

## 2023-11-16 MED ORDER — ZEPBOUND 5 MG/0.5ML ~~LOC~~ SOAJ
5.0000 mg | SUBCUTANEOUS | 3 refills | Status: DC
  Filled 2023-11-16: qty 2, 28d supply, fill #0
  Filled 2024-01-02 – 2024-06-26 (×2): qty 2, 28d supply, fill #1
  Filled 2024-09-20: qty 2, 28d supply, fill #2
  Filled 2024-10-17: qty 2, 28d supply, fill #3
  Filled 2024-11-12: qty 2, 28d supply, fill #4

## 2023-11-17 DIAGNOSIS — R051 Acute cough: Secondary | ICD-10-CM | POA: Diagnosis not present

## 2023-11-17 DIAGNOSIS — R0981 Nasal congestion: Secondary | ICD-10-CM | POA: Diagnosis not present

## 2023-12-04 DIAGNOSIS — E538 Deficiency of other specified B group vitamins: Secondary | ICD-10-CM | POA: Diagnosis not present

## 2024-01-02 ENCOUNTER — Other Ambulatory Visit (HOSPITAL_COMMUNITY): Payer: Self-pay

## 2024-01-02 ENCOUNTER — Other Ambulatory Visit: Payer: Self-pay | Admitting: Internal Medicine

## 2024-01-02 ENCOUNTER — Other Ambulatory Visit (HOSPITAL_BASED_OUTPATIENT_CLINIC_OR_DEPARTMENT_OTHER): Payer: Self-pay

## 2024-01-02 DIAGNOSIS — N6311 Unspecified lump in the right breast, upper outer quadrant: Secondary | ICD-10-CM

## 2024-01-15 ENCOUNTER — Ambulatory Visit
Admission: RE | Admit: 2024-01-15 | Discharge: 2024-01-15 | Disposition: A | Discharge status: Home / Self Care | Payer: 59 | Source: Ambulatory Visit | Attending: Internal Medicine | Admitting: Internal Medicine

## 2024-01-15 ENCOUNTER — Ambulatory Visit
Admission: RE | Admit: 2024-01-15 | Discharge: 2024-01-15 | Disposition: A | Discharge status: Home / Self Care | Payer: Medicare Other | Source: Ambulatory Visit | Attending: Internal Medicine | Admitting: Internal Medicine

## 2024-01-15 ENCOUNTER — Other Ambulatory Visit: Payer: Self-pay | Admitting: Internal Medicine

## 2024-01-15 DIAGNOSIS — N6311 Unspecified lump in the right breast, upper outer quadrant: Secondary | ICD-10-CM

## 2024-01-15 DIAGNOSIS — N6312 Unspecified lump in the right breast, upper inner quadrant: Secondary | ICD-10-CM | POA: Diagnosis not present

## 2024-01-15 DIAGNOSIS — N6310 Unspecified lump in the right breast, unspecified quadrant: Secondary | ICD-10-CM

## 2024-01-21 ENCOUNTER — Ambulatory Visit
Admission: RE | Admit: 2024-01-21 | Discharge: 2024-01-21 | Disposition: A | Discharge status: Home / Self Care | Source: Ambulatory Visit | Attending: Internal Medicine | Admitting: Internal Medicine

## 2024-01-21 DIAGNOSIS — M25551 Pain in right hip: Secondary | ICD-10-CM | POA: Diagnosis not present

## 2024-01-21 DIAGNOSIS — R92321 Mammographic fibroglandular density, right breast: Secondary | ICD-10-CM | POA: Diagnosis not present

## 2024-01-21 DIAGNOSIS — N6310 Unspecified lump in the right breast, unspecified quadrant: Secondary | ICD-10-CM

## 2024-01-21 DIAGNOSIS — N6312 Unspecified lump in the right breast, upper inner quadrant: Secondary | ICD-10-CM | POA: Diagnosis not present

## 2024-01-21 DIAGNOSIS — N6031 Fibrosclerosis of right breast: Secondary | ICD-10-CM | POA: Diagnosis not present

## 2024-01-21 HISTORY — PX: BREAST BIOPSY: SHX20

## 2024-01-22 ENCOUNTER — Encounter: Payer: Self-pay | Admitting: Obstetrics and Gynecology

## 2024-01-22 LAB — SURGICAL PATHOLOGY

## 2024-01-24 ENCOUNTER — Other Ambulatory Visit: Payer: Medicare Other

## 2024-01-31 DIAGNOSIS — Z1389 Encounter for screening for other disorder: Secondary | ICD-10-CM | POA: Diagnosis not present

## 2024-01-31 DIAGNOSIS — R7301 Impaired fasting glucose: Secondary | ICD-10-CM | POA: Diagnosis not present

## 2024-01-31 DIAGNOSIS — E538 Deficiency of other specified B group vitamins: Secondary | ICD-10-CM | POA: Diagnosis not present

## 2024-01-31 DIAGNOSIS — I10 Essential (primary) hypertension: Secondary | ICD-10-CM | POA: Diagnosis not present

## 2024-02-27 ENCOUNTER — Other Ambulatory Visit (HOSPITAL_BASED_OUTPATIENT_CLINIC_OR_DEPARTMENT_OTHER): Payer: Self-pay

## 2024-02-27 DIAGNOSIS — E538 Deficiency of other specified B group vitamins: Secondary | ICD-10-CM | POA: Diagnosis not present

## 2024-03-03 DIAGNOSIS — M810 Age-related osteoporosis without current pathological fracture: Secondary | ICD-10-CM | POA: Diagnosis not present

## 2024-03-04 DIAGNOSIS — K080 Exfoliation of teeth due to systemic causes: Secondary | ICD-10-CM | POA: Diagnosis not present

## 2024-03-11 DIAGNOSIS — M5116 Intervertebral disc disorders with radiculopathy, lumbar region: Secondary | ICD-10-CM | POA: Diagnosis not present

## 2024-03-11 DIAGNOSIS — M5416 Radiculopathy, lumbar region: Secondary | ICD-10-CM | POA: Diagnosis not present

## 2024-03-14 ENCOUNTER — Other Ambulatory Visit (HOSPITAL_BASED_OUTPATIENT_CLINIC_OR_DEPARTMENT_OTHER): Payer: Self-pay

## 2024-03-14 MED ORDER — ZEPBOUND 5 MG/0.5ML ~~LOC~~ SOAJ
5.0000 mg | SUBCUTANEOUS | 3 refills | Status: AC
  Filled 2024-03-14 – 2024-04-23 (×2): qty 2, 28d supply, fill #0
  Filled 2024-05-15: qty 2, 28d supply, fill #1
  Filled 2024-07-22: qty 2, 28d supply, fill #2
  Filled 2024-08-21: qty 2, 28d supply, fill #3

## 2024-03-18 ENCOUNTER — Other Ambulatory Visit (HOSPITAL_COMMUNITY): Payer: Self-pay | Admitting: *Deleted

## 2024-03-20 ENCOUNTER — Encounter (HOSPITAL_COMMUNITY)
Admission: RE | Admit: 2024-03-20 | Discharge: 2024-03-20 | Disposition: A | Discharge status: Home / Self Care | Source: Ambulatory Visit | Attending: Internal Medicine | Admitting: Internal Medicine

## 2024-03-20 DIAGNOSIS — M810 Age-related osteoporosis without current pathological fracture: Secondary | ICD-10-CM | POA: Diagnosis not present

## 2024-03-20 MED ORDER — DENOSUMAB 60 MG/ML ~~LOC~~ SOSY
60.0000 mg | PREFILLED_SYRINGE | Freq: Once | SUBCUTANEOUS | Status: AC
  Administered 2024-03-20: 60 mg via SUBCUTANEOUS

## 2024-03-20 MED ORDER — DENOSUMAB 60 MG/ML ~~LOC~~ SOSY
PREFILLED_SYRINGE | SUBCUTANEOUS | Status: AC
  Filled 2024-03-20: qty 1

## 2024-03-25 DIAGNOSIS — D485 Neoplasm of uncertain behavior of skin: Secondary | ICD-10-CM | POA: Diagnosis not present

## 2024-03-25 DIAGNOSIS — L820 Inflamed seborrheic keratosis: Secondary | ICD-10-CM | POA: Diagnosis not present

## 2024-03-27 ENCOUNTER — Other Ambulatory Visit (HOSPITAL_BASED_OUTPATIENT_CLINIC_OR_DEPARTMENT_OTHER): Payer: Self-pay

## 2024-04-23 ENCOUNTER — Other Ambulatory Visit (HOSPITAL_BASED_OUTPATIENT_CLINIC_OR_DEPARTMENT_OTHER): Payer: Self-pay

## 2024-05-06 DIAGNOSIS — Z85828 Personal history of other malignant neoplasm of skin: Secondary | ICD-10-CM | POA: Diagnosis not present

## 2024-05-06 DIAGNOSIS — L821 Other seborrheic keratosis: Secondary | ICD-10-CM | POA: Diagnosis not present

## 2024-05-06 DIAGNOSIS — E785 Hyperlipidemia, unspecified: Secondary | ICD-10-CM | POA: Diagnosis not present

## 2024-05-06 DIAGNOSIS — L820 Inflamed seborrheic keratosis: Secondary | ICD-10-CM | POA: Diagnosis not present

## 2024-05-07 DIAGNOSIS — E538 Deficiency of other specified B group vitamins: Secondary | ICD-10-CM | POA: Diagnosis not present

## 2024-05-15 ENCOUNTER — Other Ambulatory Visit (HOSPITAL_BASED_OUTPATIENT_CLINIC_OR_DEPARTMENT_OTHER): Payer: Self-pay

## 2024-05-17 DIAGNOSIS — M25531 Pain in right wrist: Secondary | ICD-10-CM | POA: Diagnosis not present

## 2024-05-17 DIAGNOSIS — S52501A Unspecified fracture of the lower end of right radius, initial encounter for closed fracture: Secondary | ICD-10-CM | POA: Diagnosis not present

## 2024-05-19 DIAGNOSIS — S52501D Unspecified fracture of the lower end of right radius, subsequent encounter for closed fracture with routine healing: Secondary | ICD-10-CM | POA: Diagnosis not present

## 2024-05-29 DIAGNOSIS — X58XXXA Exposure to other specified factors, initial encounter: Secondary | ICD-10-CM | POA: Diagnosis not present

## 2024-05-29 DIAGNOSIS — S52501A Unspecified fracture of the lower end of right radius, initial encounter for closed fracture: Secondary | ICD-10-CM | POA: Diagnosis not present

## 2024-05-29 DIAGNOSIS — S52571A Other intraarticular fracture of lower end of right radius, initial encounter for closed fracture: Secondary | ICD-10-CM | POA: Diagnosis not present

## 2024-05-29 DIAGNOSIS — Y999 Unspecified external cause status: Secondary | ICD-10-CM | POA: Diagnosis not present

## 2024-06-16 DIAGNOSIS — S52501D Unspecified fracture of the lower end of right radius, subsequent encounter for closed fracture with routine healing: Secondary | ICD-10-CM | POA: Diagnosis not present

## 2024-06-17 DIAGNOSIS — R059 Cough, unspecified: Secondary | ICD-10-CM | POA: Diagnosis not present

## 2024-06-17 DIAGNOSIS — U071 COVID-19: Secondary | ICD-10-CM | POA: Diagnosis not present

## 2024-06-18 ENCOUNTER — Other Ambulatory Visit (HOSPITAL_COMMUNITY): Payer: Self-pay

## 2024-06-18 ENCOUNTER — Other Ambulatory Visit (HOSPITAL_BASED_OUTPATIENT_CLINIC_OR_DEPARTMENT_OTHER): Payer: Self-pay

## 2024-06-18 MED ORDER — LAGEVRIO 200 MG PO CAPS
4.0000 | ORAL_CAPSULE | Freq: Two times a day (BID) | ORAL | 0 refills | Status: AC
  Filled 2024-06-18: qty 40, 5d supply, fill #0

## 2024-06-19 ENCOUNTER — Other Ambulatory Visit (HOSPITAL_COMMUNITY): Payer: Self-pay

## 2024-06-25 ENCOUNTER — Other Ambulatory Visit (HOSPITAL_COMMUNITY): Payer: Self-pay

## 2024-06-25 DIAGNOSIS — E538 Deficiency of other specified B group vitamins: Secondary | ICD-10-CM | POA: Diagnosis not present

## 2024-06-26 ENCOUNTER — Other Ambulatory Visit (HOSPITAL_BASED_OUTPATIENT_CLINIC_OR_DEPARTMENT_OTHER): Payer: Self-pay

## 2024-06-27 ENCOUNTER — Other Ambulatory Visit (HOSPITAL_COMMUNITY): Payer: Self-pay

## 2024-06-30 DIAGNOSIS — I10 Essential (primary) hypertension: Secondary | ICD-10-CM | POA: Diagnosis not present

## 2024-06-30 DIAGNOSIS — R42 Dizziness and giddiness: Secondary | ICD-10-CM | POA: Diagnosis not present

## 2024-07-08 DIAGNOSIS — K080 Exfoliation of teeth due to systemic causes: Secondary | ICD-10-CM | POA: Diagnosis not present

## 2024-07-14 DIAGNOSIS — S52501D Unspecified fracture of the lower end of right radius, subsequent encounter for closed fracture with routine healing: Secondary | ICD-10-CM | POA: Diagnosis not present

## 2024-07-22 ENCOUNTER — Other Ambulatory Visit (HOSPITAL_BASED_OUTPATIENT_CLINIC_OR_DEPARTMENT_OTHER): Payer: Self-pay

## 2024-07-29 ENCOUNTER — Other Ambulatory Visit: Payer: Self-pay | Admitting: Internal Medicine

## 2024-08-15 DIAGNOSIS — S52501D Unspecified fracture of the lower end of right radius, subsequent encounter for closed fracture with routine healing: Secondary | ICD-10-CM | POA: Diagnosis not present

## 2024-08-21 ENCOUNTER — Other Ambulatory Visit (HOSPITAL_BASED_OUTPATIENT_CLINIC_OR_DEPARTMENT_OTHER): Payer: Self-pay

## 2024-08-27 DIAGNOSIS — E785 Hyperlipidemia, unspecified: Secondary | ICD-10-CM | POA: Diagnosis not present

## 2024-08-27 DIAGNOSIS — R7301 Impaired fasting glucose: Secondary | ICD-10-CM | POA: Diagnosis not present

## 2024-08-27 DIAGNOSIS — E538 Deficiency of other specified B group vitamins: Secondary | ICD-10-CM | POA: Diagnosis not present

## 2024-09-03 DIAGNOSIS — Z0000 Encounter for general adult medical examination without abnormal findings: Secondary | ICD-10-CM | POA: Diagnosis not present

## 2024-09-03 DIAGNOSIS — I10 Essential (primary) hypertension: Secondary | ICD-10-CM | POA: Diagnosis not present

## 2024-09-03 DIAGNOSIS — R82998 Other abnormal findings in urine: Secondary | ICD-10-CM | POA: Diagnosis not present

## 2024-09-09 ENCOUNTER — Other Ambulatory Visit (HOSPITAL_COMMUNITY): Payer: Self-pay | Admitting: Internal Medicine

## 2024-09-09 DIAGNOSIS — M810 Age-related osteoporosis without current pathological fracture: Secondary | ICD-10-CM | POA: Insufficient documentation

## 2024-09-11 ENCOUNTER — Other Ambulatory Visit (HOSPITAL_COMMUNITY): Payer: Self-pay | Admitting: Internal Medicine

## 2024-09-11 ENCOUNTER — Telehealth (HOSPITAL_COMMUNITY): Payer: Self-pay

## 2024-09-11 NOTE — Telephone Encounter (Signed)
 NOTE: Per [facility] practice, MD approved biosimilars switch for Prolia  per payor preferences.    Auth Submission: NO AUTH NEEDED Site of care: Site of care: [facility] Payer: [employer] Medicare Medication & CPT/J Code(s) submitted: Conexxence (V4841) Diagnosis Code: M81.0 Route of submission (phone, fax, portal):  Phone # Fax # Auth type: Buy/Bill HB Units/visits requested: 60mg  q61months Reference number:  Approval from: 09/11/24 to 11/19/24

## 2024-09-17 DIAGNOSIS — R2689 Other abnormalities of gait and mobility: Secondary | ICD-10-CM | POA: Diagnosis not present

## 2024-09-17 DIAGNOSIS — M461 Sacroiliitis, not elsewhere classified: Secondary | ICD-10-CM | POA: Diagnosis not present

## 2024-09-17 DIAGNOSIS — H818X9 Other disorders of vestibular function, unspecified ear: Secondary | ICD-10-CM | POA: Diagnosis not present

## 2024-09-17 DIAGNOSIS — M6281 Muscle weakness (generalized): Secondary | ICD-10-CM | POA: Diagnosis not present

## 2024-09-19 DIAGNOSIS — M6281 Muscle weakness (generalized): Secondary | ICD-10-CM | POA: Diagnosis not present

## 2024-09-19 DIAGNOSIS — R2689 Other abnormalities of gait and mobility: Secondary | ICD-10-CM | POA: Diagnosis not present

## 2024-09-19 DIAGNOSIS — H818X9 Other disorders of vestibular function, unspecified ear: Secondary | ICD-10-CM | POA: Diagnosis not present

## 2024-09-19 DIAGNOSIS — M461 Sacroiliitis, not elsewhere classified: Secondary | ICD-10-CM | POA: Diagnosis not present

## 2024-09-20 ENCOUNTER — Other Ambulatory Visit (HOSPITAL_BASED_OUTPATIENT_CLINIC_OR_DEPARTMENT_OTHER): Payer: Self-pay

## 2024-09-22 DIAGNOSIS — M461 Sacroiliitis, not elsewhere classified: Secondary | ICD-10-CM | POA: Diagnosis not present

## 2024-09-22 DIAGNOSIS — H818X9 Other disorders of vestibular function, unspecified ear: Secondary | ICD-10-CM | POA: Diagnosis not present

## 2024-09-22 DIAGNOSIS — M6281 Muscle weakness (generalized): Secondary | ICD-10-CM | POA: Diagnosis not present

## 2024-09-22 DIAGNOSIS — R2689 Other abnormalities of gait and mobility: Secondary | ICD-10-CM | POA: Diagnosis not present

## 2024-09-26 DIAGNOSIS — M6281 Muscle weakness (generalized): Secondary | ICD-10-CM | POA: Diagnosis not present

## 2024-09-26 DIAGNOSIS — R2689 Other abnormalities of gait and mobility: Secondary | ICD-10-CM | POA: Diagnosis not present

## 2024-09-26 DIAGNOSIS — H818X9 Other disorders of vestibular function, unspecified ear: Secondary | ICD-10-CM | POA: Diagnosis not present

## 2024-09-26 DIAGNOSIS — M461 Sacroiliitis, not elsewhere classified: Secondary | ICD-10-CM | POA: Diagnosis not present

## 2024-10-01 DIAGNOSIS — H818X9 Other disorders of vestibular function, unspecified ear: Secondary | ICD-10-CM | POA: Diagnosis not present

## 2024-10-01 DIAGNOSIS — R2689 Other abnormalities of gait and mobility: Secondary | ICD-10-CM | POA: Diagnosis not present

## 2024-10-01 DIAGNOSIS — M6281 Muscle weakness (generalized): Secondary | ICD-10-CM | POA: Diagnosis not present

## 2024-10-01 DIAGNOSIS — M461 Sacroiliitis, not elsewhere classified: Secondary | ICD-10-CM | POA: Diagnosis not present

## 2024-10-03 DIAGNOSIS — H818X9 Other disorders of vestibular function, unspecified ear: Secondary | ICD-10-CM | POA: Diagnosis not present

## 2024-10-03 DIAGNOSIS — R2689 Other abnormalities of gait and mobility: Secondary | ICD-10-CM | POA: Diagnosis not present

## 2024-10-03 DIAGNOSIS — M461 Sacroiliitis, not elsewhere classified: Secondary | ICD-10-CM | POA: Diagnosis not present

## 2024-10-03 DIAGNOSIS — M6281 Muscle weakness (generalized): Secondary | ICD-10-CM | POA: Diagnosis not present

## 2024-10-06 ENCOUNTER — Encounter (HOSPITAL_COMMUNITY)

## 2024-10-06 DIAGNOSIS — M6281 Muscle weakness (generalized): Secondary | ICD-10-CM | POA: Diagnosis not present

## 2024-10-06 DIAGNOSIS — H818X9 Other disorders of vestibular function, unspecified ear: Secondary | ICD-10-CM | POA: Diagnosis not present

## 2024-10-06 DIAGNOSIS — M461 Sacroiliitis, not elsewhere classified: Secondary | ICD-10-CM | POA: Diagnosis not present

## 2024-10-06 DIAGNOSIS — R2689 Other abnormalities of gait and mobility: Secondary | ICD-10-CM | POA: Diagnosis not present

## 2024-10-08 ENCOUNTER — Ambulatory Visit (HOSPITAL_COMMUNITY)
Admission: RE | Admit: 2024-10-08 | Discharge: 2024-10-08 | Disposition: A | Discharge status: Home / Self Care | Source: Ambulatory Visit | Attending: Internal Medicine | Admitting: Internal Medicine

## 2024-10-08 VITALS — BP 139/83 | HR 84 | Temp 97.0°F | Resp 17

## 2024-10-08 DIAGNOSIS — M461 Sacroiliitis, not elsewhere classified: Secondary | ICD-10-CM | POA: Diagnosis not present

## 2024-10-08 DIAGNOSIS — E538 Deficiency of other specified B group vitamins: Secondary | ICD-10-CM | POA: Diagnosis not present

## 2024-10-08 DIAGNOSIS — M810 Age-related osteoporosis without current pathological fracture: Secondary | ICD-10-CM | POA: Insufficient documentation

## 2024-10-08 DIAGNOSIS — M6281 Muscle weakness (generalized): Secondary | ICD-10-CM | POA: Diagnosis not present

## 2024-10-08 DIAGNOSIS — R2689 Other abnormalities of gait and mobility: Secondary | ICD-10-CM | POA: Diagnosis not present

## 2024-10-08 DIAGNOSIS — H818X9 Other disorders of vestibular function, unspecified ear: Secondary | ICD-10-CM | POA: Diagnosis not present

## 2024-10-08 MED ORDER — DENOSUMAB-BNHT 60 MG/ML ~~LOC~~ SOSY
60.0000 mg | PREFILLED_SYRINGE | Freq: Once | SUBCUTANEOUS | Status: AC
  Administered 2024-10-08: 60 mg via SUBCUTANEOUS
  Filled 2024-10-08: qty 1

## 2024-10-10 ENCOUNTER — Encounter (HOSPITAL_COMMUNITY)

## 2024-10-13 DIAGNOSIS — H818X9 Other disorders of vestibular function, unspecified ear: Secondary | ICD-10-CM | POA: Diagnosis not present

## 2024-10-13 DIAGNOSIS — R2689 Other abnormalities of gait and mobility: Secondary | ICD-10-CM | POA: Diagnosis not present

## 2024-10-13 DIAGNOSIS — M461 Sacroiliitis, not elsewhere classified: Secondary | ICD-10-CM | POA: Diagnosis not present

## 2024-10-13 DIAGNOSIS — M6281 Muscle weakness (generalized): Secondary | ICD-10-CM | POA: Diagnosis not present

## 2024-10-17 ENCOUNTER — Other Ambulatory Visit (HOSPITAL_BASED_OUTPATIENT_CLINIC_OR_DEPARTMENT_OTHER): Payer: Self-pay

## 2024-10-20 DIAGNOSIS — M461 Sacroiliitis, not elsewhere classified: Secondary | ICD-10-CM | POA: Diagnosis not present

## 2024-10-20 DIAGNOSIS — H818X9 Other disorders of vestibular function, unspecified ear: Secondary | ICD-10-CM | POA: Diagnosis not present

## 2024-10-20 DIAGNOSIS — M6281 Muscle weakness (generalized): Secondary | ICD-10-CM | POA: Diagnosis not present

## 2024-10-20 DIAGNOSIS — R2689 Other abnormalities of gait and mobility: Secondary | ICD-10-CM | POA: Diagnosis not present

## 2024-10-22 DIAGNOSIS — M6281 Muscle weakness (generalized): Secondary | ICD-10-CM | POA: Diagnosis not present

## 2024-10-22 DIAGNOSIS — H818X9 Other disorders of vestibular function, unspecified ear: Secondary | ICD-10-CM | POA: Diagnosis not present

## 2024-10-22 DIAGNOSIS — R2689 Other abnormalities of gait and mobility: Secondary | ICD-10-CM | POA: Diagnosis not present

## 2024-10-22 DIAGNOSIS — M461 Sacroiliitis, not elsewhere classified: Secondary | ICD-10-CM | POA: Diagnosis not present

## 2024-10-24 DIAGNOSIS — M461 Sacroiliitis, not elsewhere classified: Secondary | ICD-10-CM | POA: Diagnosis not present

## 2024-10-24 DIAGNOSIS — H818X9 Other disorders of vestibular function, unspecified ear: Secondary | ICD-10-CM | POA: Diagnosis not present

## 2024-10-24 DIAGNOSIS — R2689 Other abnormalities of gait and mobility: Secondary | ICD-10-CM | POA: Diagnosis not present

## 2024-10-24 DIAGNOSIS — M6281 Muscle weakness (generalized): Secondary | ICD-10-CM | POA: Diagnosis not present

## 2024-10-29 DIAGNOSIS — H818X9 Other disorders of vestibular function, unspecified ear: Secondary | ICD-10-CM | POA: Diagnosis not present

## 2024-10-29 DIAGNOSIS — R2689 Other abnormalities of gait and mobility: Secondary | ICD-10-CM | POA: Diagnosis not present

## 2024-10-29 DIAGNOSIS — M6281 Muscle weakness (generalized): Secondary | ICD-10-CM | POA: Diagnosis not present

## 2024-10-29 DIAGNOSIS — M461 Sacroiliitis, not elsewhere classified: Secondary | ICD-10-CM | POA: Diagnosis not present

## 2024-11-04 DIAGNOSIS — E538 Deficiency of other specified B group vitamins: Secondary | ICD-10-CM | POA: Diagnosis not present

## 2024-11-05 DIAGNOSIS — H818X9 Other disorders of vestibular function, unspecified ear: Secondary | ICD-10-CM | POA: Diagnosis not present

## 2024-11-05 DIAGNOSIS — M461 Sacroiliitis, not elsewhere classified: Secondary | ICD-10-CM | POA: Diagnosis not present

## 2024-11-05 DIAGNOSIS — M6281 Muscle weakness (generalized): Secondary | ICD-10-CM | POA: Diagnosis not present

## 2024-11-05 DIAGNOSIS — R2689 Other abnormalities of gait and mobility: Secondary | ICD-10-CM | POA: Diagnosis not present

## 2024-11-07 DIAGNOSIS — M461 Sacroiliitis, not elsewhere classified: Secondary | ICD-10-CM | POA: Diagnosis not present

## 2024-11-07 DIAGNOSIS — H818X9 Other disorders of vestibular function, unspecified ear: Secondary | ICD-10-CM | POA: Diagnosis not present

## 2024-11-07 DIAGNOSIS — M6281 Muscle weakness (generalized): Secondary | ICD-10-CM | POA: Diagnosis not present

## 2024-11-07 DIAGNOSIS — R2689 Other abnormalities of gait and mobility: Secondary | ICD-10-CM | POA: Diagnosis not present

## 2024-11-12 ENCOUNTER — Other Ambulatory Visit (HOSPITAL_BASED_OUTPATIENT_CLINIC_OR_DEPARTMENT_OTHER): Payer: Self-pay

## 2024-11-15 ENCOUNTER — Other Ambulatory Visit (HOSPITAL_BASED_OUTPATIENT_CLINIC_OR_DEPARTMENT_OTHER): Payer: Self-pay

## 2024-11-26 ENCOUNTER — Other Ambulatory Visit: Payer: Self-pay | Admitting: Internal Medicine

## 2024-11-26 DIAGNOSIS — Z1231 Encounter for screening mammogram for malignant neoplasm of breast: Secondary | ICD-10-CM

## 2024-12-01 ENCOUNTER — Ambulatory Visit
Admission: RE | Admit: 2024-12-01 | Discharge: 2024-12-01 | Disposition: A | Source: Ambulatory Visit | Attending: Internal Medicine | Admitting: Internal Medicine

## 2024-12-01 DIAGNOSIS — Z1231 Encounter for screening mammogram for malignant neoplasm of breast: Secondary | ICD-10-CM

## 2024-12-03 ENCOUNTER — Ambulatory Visit: Payer: Self-pay | Admitting: Obstetrics and Gynecology

## 2024-12-08 ENCOUNTER — Other Ambulatory Visit (HOSPITAL_BASED_OUTPATIENT_CLINIC_OR_DEPARTMENT_OTHER): Payer: Self-pay

## 2024-12-08 MED ORDER — ZEPBOUND 5 MG/0.5ML ~~LOC~~ SOAJ
5.0000 mg | SUBCUTANEOUS | 5 refills | Status: AC
  Filled 2024-12-08: qty 2, 28d supply, fill #0

## 2025-04-06 ENCOUNTER — Encounter (HOSPITAL_COMMUNITY)

## 2025-04-07 ENCOUNTER — Encounter (HOSPITAL_COMMUNITY)

## 2025-04-08 ENCOUNTER — Encounter (HOSPITAL_COMMUNITY)
